# Patient Record
Sex: Female | Born: 1955
Health system: Southern US, Community
[De-identification: ages and names within clinical notes are randomized; demographics above are authoritative.]

## PROBLEM LIST (undated history)

## (undated) DIAGNOSIS — G4700 Insomnia, unspecified: Secondary | ICD-10-CM

## (undated) DIAGNOSIS — Z800 Family history of malignant neoplasm of digestive organs: Secondary | ICD-10-CM

## (undated) DIAGNOSIS — F329 Major depressive disorder, single episode, unspecified: Secondary | ICD-10-CM

## (undated) DIAGNOSIS — J45909 Unspecified asthma, uncomplicated: Secondary | ICD-10-CM

## (undated) DIAGNOSIS — M1990 Unspecified osteoarthritis, unspecified site: Secondary | ICD-10-CM

## (undated) DIAGNOSIS — F419 Anxiety disorder, unspecified: Secondary | ICD-10-CM

## (undated) DIAGNOSIS — C801 Malignant (primary) neoplasm, unspecified: Secondary | ICD-10-CM

## (undated) DIAGNOSIS — R569 Unspecified convulsions: Secondary | ICD-10-CM

## (undated) DIAGNOSIS — M797 Fibromyalgia: Secondary | ICD-10-CM

## (undated) DIAGNOSIS — K219 Gastro-esophageal reflux disease without esophagitis: Secondary | ICD-10-CM

## (undated) DIAGNOSIS — F32A Depression, unspecified: Secondary | ICD-10-CM

## (undated) DIAGNOSIS — Z803 Family history of malignant neoplasm of breast: Secondary | ICD-10-CM

## (undated) DIAGNOSIS — G40909 Epilepsy, unspecified, not intractable, without status epilepticus: Secondary | ICD-10-CM

## (undated) HISTORY — DX: Family history of malignant neoplasm of digestive organs: Z80.0

## (undated) HISTORY — PX: BUNIONECTOMY: SHX129

## (undated) HISTORY — DX: Unspecified convulsions: R56.9

## (undated) HISTORY — DX: Epilepsy, unspecified, not intractable, without status epilepticus: G40.909

## (undated) HISTORY — PX: ABDOMINAL HYSTERECTOMY: SHX81

## (undated) HISTORY — DX: Depression, unspecified: F32.A

## (undated) HISTORY — DX: Unspecified osteoarthritis, unspecified site: M19.90

## (undated) HISTORY — DX: Anxiety disorder, unspecified: F41.9

## (undated) HISTORY — DX: Family history of malignant neoplasm of breast: Z80.3

## (undated) HISTORY — DX: Fibromyalgia: M79.7

## (undated) HISTORY — PX: CARPAL TUNNEL RELEASE: SHX101

## (undated) HISTORY — PX: OTHER SURGICAL HISTORY: SHX169

## (undated) HISTORY — DX: Major depressive disorder, single episode, unspecified: F32.9

## (undated) HISTORY — PX: BREAST BIOPSY: SHX20

## (undated) HISTORY — PX: TUBAL LIGATION: SHX77

## (undated) HISTORY — DX: Unspecified asthma, uncomplicated: J45.909

---

## 1982-01-15 HISTORY — PX: TUBAL LIGATION: SHX77

## 2013-02-25 DIAGNOSIS — Z8659 Personal history of other mental and behavioral disorders: Secondary | ICD-10-CM | POA: Insufficient documentation

## 2013-02-25 DIAGNOSIS — Z9889 Other specified postprocedural states: Secondary | ICD-10-CM | POA: Insufficient documentation

## 2013-02-25 DIAGNOSIS — Z8679 Personal history of other diseases of the circulatory system: Secondary | ICD-10-CM | POA: Insufficient documentation

## 2013-02-25 DIAGNOSIS — M7730 Calcaneal spur, unspecified foot: Secondary | ICD-10-CM | POA: Insufficient documentation

## 2013-03-26 DIAGNOSIS — R87610 Atypical squamous cells of undetermined significance on cytologic smear of cervix (ASC-US): Secondary | ICD-10-CM | POA: Insufficient documentation

## 2014-03-29 ENCOUNTER — Encounter: Payer: Self-pay | Admitting: Family Medicine

## 2014-03-29 ENCOUNTER — Ambulatory Visit (INDEPENDENT_AMBULATORY_CARE_PROVIDER_SITE_OTHER): Payer: No Typology Code available for payment source

## 2014-03-29 ENCOUNTER — Ambulatory Visit (INDEPENDENT_AMBULATORY_CARE_PROVIDER_SITE_OTHER): Payer: No Typology Code available for payment source | Admitting: Family Medicine

## 2014-03-29 VITALS — BP 134/86 | HR 71 | Temp 98.3°F | Resp 16 | Ht 60.25 in | Wt 165.4 lb

## 2014-03-29 DIAGNOSIS — Z8709 Personal history of other diseases of the respiratory system: Secondary | ICD-10-CM | POA: Diagnosis not present

## 2014-03-29 DIAGNOSIS — T7421XA Adult sexual abuse, confirmed, initial encounter: Secondary | ICD-10-CM

## 2014-03-29 DIAGNOSIS — M25562 Pain in left knee: Secondary | ICD-10-CM | POA: Diagnosis not present

## 2014-03-29 DIAGNOSIS — R0602 Shortness of breath: Secondary | ICD-10-CM

## 2014-03-29 MED ORDER — MELOXICAM 15 MG PO TABS: 15.0000 mg | ORAL_TABLET | Freq: Every day | ORAL | Status: DC
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
## 2014-03-29 NOTE — Patient Instructions (Signed)
Good to see you today!  Your spirometry (breathing test) looks good.  If you have any worsening or change in your breathing symptoms please let me know Your knee I hope will get back to baseline- however if it does not we can pursue an MRI.  Try taking meloxicam once a day as needed.  While you are taking this do not take other OTC NSAIDs such as ibuprofen.  You can continue to use your knee brace as needed   There are many good counselors in [place].  A couple of suggestions are  [facility]  Address: [address], [place], [place] [address]  [phone]  [facility]  Address: [address] [place], [place] [address]  [phone]

## 2014-05-25 ENCOUNTER — Ambulatory Visit: Payer: No Typology Code available for payment source | Admitting: Family Medicine

## 2014-05-27 ENCOUNTER — Ambulatory Visit (INDEPENDENT_AMBULATORY_CARE_PROVIDER_SITE_OTHER): Payer: No Typology Code available for payment source | Admitting: Family Medicine

## 2014-05-27 ENCOUNTER — Encounter: Payer: Self-pay | Admitting: Family Medicine

## 2014-05-27 VITALS — BP 124/84 | HR 81 | Temp 99.0°F | Resp 16 | Ht 60.0 in | Wt 165.8 lb

## 2014-05-27 DIAGNOSIS — M25562 Pain in left knee: Secondary | ICD-10-CM

## 2014-05-27 DIAGNOSIS — F418 Other specified anxiety disorders: Secondary | ICD-10-CM | POA: Diagnosis not present

## 2014-05-27 DIAGNOSIS — G40109 Localization-related (focal) (partial) symptomatic epilepsy and epileptic syndromes with simple partial seizures, not intractable, without status epilepticus: Secondary | ICD-10-CM | POA: Diagnosis not present

## 2014-05-27 DIAGNOSIS — M797 Fibromyalgia: Secondary | ICD-10-CM

## 2014-05-27 MED ORDER — SULINDAC 200 MG PO TABS: 200.0000 mg | ORAL_TABLET | Freq: Two times a day (BID) | ORAL | Status: DC

## 2014-05-31 ENCOUNTER — Encounter: Payer: Self-pay | Admitting: Family Medicine

## 2014-05-31 DIAGNOSIS — F331 Major depressive disorder, recurrent, moderate: Secondary | ICD-10-CM | POA: Insufficient documentation

## 2014-05-31 DIAGNOSIS — M797 Fibromyalgia: Secondary | ICD-10-CM | POA: Insufficient documentation

## 2014-05-31 DIAGNOSIS — F329 Major depressive disorder, single episode, unspecified: Secondary | ICD-10-CM | POA: Insufficient documentation
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
## 2014-06-28 ENCOUNTER — Ambulatory Visit: Payer: No Typology Code available for payment source | Admitting: Family Medicine

## 2014-08-24 ENCOUNTER — Ambulatory Visit (INDEPENDENT_AMBULATORY_CARE_PROVIDER_SITE_OTHER): Payer: No Typology Code available for payment source | Admitting: Family Medicine

## 2014-08-24 ENCOUNTER — Encounter: Payer: Self-pay | Admitting: Family Medicine

## 2014-08-24 ENCOUNTER — Telehealth: Payer: Self-pay | Admitting: Family Medicine

## 2014-08-24 VITALS — BP 130/83 | HR 75 | Temp 97.7°F | Resp 16 | Wt 169.0 lb

## 2014-08-24 DIAGNOSIS — R05 Cough: Secondary | ICD-10-CM

## 2014-08-24 DIAGNOSIS — R0602 Shortness of breath: Secondary | ICD-10-CM

## 2014-08-24 DIAGNOSIS — M797 Fibromyalgia: Secondary | ICD-10-CM

## 2014-08-24 DIAGNOSIS — R5383 Other fatigue: Secondary | ICD-10-CM | POA: Diagnosis not present

## 2014-08-24 DIAGNOSIS — R059 Cough, unspecified: Secondary | ICD-10-CM

## 2014-08-24 DIAGNOSIS — E669 Obesity, unspecified: Secondary | ICD-10-CM

## 2014-08-24 DIAGNOSIS — J4530 Mild persistent asthma, uncomplicated: Secondary | ICD-10-CM

## 2014-08-24 DIAGNOSIS — M25562 Pain in left knee: Secondary | ICD-10-CM | POA: Diagnosis not present

## 2014-08-24 LAB — CBC
HCT: 41.9 % (ref 36.0–46.0)
Hemoglobin: 14.3 g/dL (ref 12.0–15.0)
MCH: 32 pg (ref 26.0–34.0)
MCHC: 34.1 g/dL (ref 30.0–36.0)
MCV: 93.7 fL (ref 78.0–100.0)
MPV: 11.5 fL (ref 8.6–12.4)
Platelets: 257 10*3/uL (ref 150–400)
RBC: 4.47 MIL/uL (ref 3.87–5.11)
RDW: 13.2 % (ref 11.5–15.5)
WBC: 5.7 10*3/uL (ref 4.0–10.5)

## 2014-08-24 LAB — COMPREHENSIVE METABOLIC PANEL
ALBUMIN: 4.1 g/dL (ref 3.6–5.1)
ALK PHOS: 69 U/L (ref 33–130)
ALT: 12 U/L (ref 6–29)
AST: 18 U/L (ref 10–35)
BUN: 15 mg/dL (ref 7–25)
CALCIUM: 9.1 mg/dL (ref 8.6–10.4)
CO2: 26 mmol/L (ref 20–31)
CREATININE: 0.78 mg/dL (ref 0.50–1.05)
Chloride: 104 mmol/L (ref 98–110)
Glucose, Bld: 90 mg/dL (ref 65–99)
Potassium: 4 mmol/L (ref 3.5–5.3)
Sodium: 140 mmol/L (ref 135–146)
TOTAL PROTEIN: 6.8 g/dL (ref 6.1–8.1)
Total Bilirubin: 0.4 mg/dL (ref 0.2–1.2)

## 2014-08-24 LAB — LIPID PANEL
CHOLESTEROL: 193 mg/dL (ref 125–200)
HDL: 58 mg/dL (ref >=46)
LDL Cholesterol: 113 mg/dL (ref <130)
TRIGLYCERIDES: 110 mg/dL (ref <150)
Total CHOL/HDL Ratio: 3.3 Ratio (ref <=5.0)
VLDL: 22 mg/dL (ref <30)

## 2014-08-24 LAB — PULMONARY FUNCTION TEST

## 2014-08-24 LAB — TSH: TSH: 2.232 u[IU]/mL (ref 0.350–4.500)

## 2014-08-24 LAB — HEMOGLOBIN A1C
Hgb A1c MFr Bld: 5.9 % — ABNORMAL HIGH (ref <5.7)
MEAN PLASMA GLUCOSE: 123 mg/dL — AB (ref <117)

## 2014-08-24 MED ORDER — ALBUTEROL SULFATE (2.5 MG/3ML) 0.083% IN NEBU: 2.5000 mg | INHALATION_SOLUTION | Freq: Once | RESPIRATORY_TRACT | Status: DC

## 2014-08-24 MED ORDER — ALBUTEROL SULFATE HFA 108 (90 BASE) MCG/ACT IN AERS: 2.0000 | INHALATION_SPRAY | RESPIRATORY_TRACT | Status: DC | PRN

## 2014-08-24 NOTE — Patient Instructions (Signed)
Take ranitidine twice a day for two weeks- if this significantly helps your acid indigestion, you can keep taking twice a day. Otherwise, you can continue to take once a day.   Gastroesophageal Reflux Disease, Adult Gastroesophageal reflux disease (GERD) happens when acid from your stomach flows up into the esophagus. When acid comes in contact with the esophagus, the acid causes soreness (inflammation) in the esophagus. Over time, GERD may create small holes (ulcers) in the lining of the esophagus. CAUSES   Increased body weight. This puts pressure on the stomach, making acid rise from the stomach into the esophagus.  Smoking. This increases acid production in the stomach.  Drinking alcohol. This causes decreased pressure in the lower esophageal sphincter (valve or ring of muscle between the esophagus and stomach), allowing acid from the stomach into the esophagus.  Late evening meals and a full stomach. This increases pressure and acid production in the stomach.  A malformed lower esophageal sphincter. Sometimes, no cause is found. SYMPTOMS   Burning pain in the lower part of the mid-chest behind the breastbone and in the mid-stomach area. This may occur twice a week or more often.  Trouble swallowing.  Sore throat.  Dry cough.  Asthma-like symptoms including chest tightness, shortness of breath, or wheezing. DIAGNOSIS  Your caregiver may be able to diagnose GERD based on your symptoms. In some cases, X-rays and other tests may be done to check for complications or to check the condition of your stomach and esophagus. TREATMENT  Your caregiver may recommend over-the-counter or prescription medicines to help decrease acid production. Ask your caregiver before starting or adding any new medicines.  HOME CARE INSTRUCTIONS   Change the factors that you can control. Ask your caregiver for guidance concerning weight loss, quitting smoking, and alcohol consumption.  Avoid foods and  drinks that make your symptoms worse, such as:  Caffeine or alcoholic drinks.  Chocolate.  Peppermint or mint flavorings.  Garlic and onions.  Spicy foods.  Citrus fruits, such as oranges, lemons, or limes.  Tomato-based foods such as sauce, chili, salsa, and pizza.  Fried and fatty foods.  Avoid lying down for the 3 hours prior to your bedtime or prior to taking a nap.  Eat small, frequent meals instead of large meals.  Wear loose-fitting clothing. Do not wear anything tight around your waist that causes pressure on your stomach.  Raise the head of your bed 6 to 8 inches with wood blocks to help you sleep. Extra pillows will not help.  Only take over-the-counter or prescription medicines for pain, discomfort, or fever as directed by your caregiver.  Do not take aspirin, ibuprofen, or other nonsteroidal anti-inflammatory drugs (NSAIDs). SEEK IMMEDIATE MEDICAL CARE IF:   You have pain in your arms, neck, jaw, teeth, or back.  Your pain increases or changes in intensity or duration.  You develop nausea, vomiting, or sweating (diaphoresis).  You develop shortness of breath, or you faint.  Your vomit is green, yellow, black, or looks like coffee grounds or blood.  Your stool is red, bloody, or black. These symptoms could be signs of other problems, such as heart disease, gastric bleeding, or esophageal bleeding. MAKE SURE YOU:   Understand these instructions.  Will watch your condition.  Will get help right away if you are not doing well or get worse. Document Released: 10/11/2004 Document Revised: 03/26/2011 Document Reviewed: 07/21/2010 [facility] Patient Information 2015 [place], [place]. This information is not intended to replace advice given to you by  your health care provider. Make sure you discuss any questions you have with your health care provider.  Asthma Asthma is a recurring condition in which the airways tighten and narrow. Asthma can make it difficult to  breathe. It can cause coughing, wheezing, and shortness of breath. Asthma episodes, also called asthma attacks, range from minor to life-threatening. Asthma cannot be cured, but medicines and lifestyle changes can help control it. CAUSES Asthma is believed to be caused by inherited (genetic) and environmental factors, but its exact cause is unknown. Asthma may be triggered by allergens, lung infections, or irritants in the air. Asthma triggers are different for each person. Common triggers include:   Animal dander.  Dust mites.  Cockroaches.  Pollen from trees or grass.  Mold.  Smoke.  Air pollutants such as dust, household cleaners, hair sprays, aerosol sprays, paint fumes, strong chemicals, or strong odors.  Cold air, weather changes, and winds (which increase molds and pollens in the air).  Strong emotional expressions such as crying or laughing hard.  Stress.  Certain medicines (such as aspirin) or types of drugs (such as beta-blockers).  Sulfites in foods and drinks. Foods and drinks that may contain sulfites include dried fruit, potato chips, and sparkling grape juice.  Infections or inflammatory conditions such as the flu, a cold, or an inflammation of the nasal membranes (rhinitis).  Gastroesophageal reflux disease (GERD).  Exercise or strenuous activity. SYMPTOMS Symptoms may occur immediately after asthma is triggered or many hours later. Symptoms include:  Wheezing.  Excessive nighttime or early morning coughing.  Frequent or severe coughing with a common cold.  Chest tightness.  Shortness of breath. DIAGNOSIS  The diagnosis of asthma is made by a review of your medical history and a physical exam. Tests may also be performed. These may include:  Lung function studies. These tests show how much air you breathe in and out.  Allergy tests.  Imaging tests such as X-rays. TREATMENT  Asthma cannot be cured, but it can usually be controlled. Treatment  involves identifying and avoiding your asthma triggers. It also involves medicines. There are 2 classes of medicine used for asthma treatment:   Controller medicines. These prevent asthma symptoms from occurring. They are usually taken every day.  Reliever or rescue medicines. These quickly relieve asthma symptoms. They are used as needed and provide short-term relief. Your health care provider will help you create an asthma action plan. An asthma action plan is a written plan for managing and treating your asthma attacks. It includes a list of your asthma triggers and how they may be avoided. It also includes information on when medicines should be taken and when their dosage should be changed. An action plan may also involve the use of a device called a peak flow meter. A peak flow meter measures how well the lungs are working. It helps you monitor your condition. HOME CARE INSTRUCTIONS   Take medicines only as directed by your health care provider. Speak with your health care provider if you have questions about how or when to take the medicines.  Use a peak flow meter as directed by your health care provider. Record and keep track of readings.  Understand and use the action plan to help minimize or stop an asthma attack without needing to seek medical care.  Control your home environment in the following ways to help prevent asthma attacks:  Do not smoke. Avoid being exposed to secondhand smoke.  Change your heating and air conditioning filter  regularly.  Limit your use of fireplaces and wood stoves.  Get rid of pests (such as roaches and mice) and their droppings.  Throw away plants if you see mold on them.  Clean your floors and dust regularly. Use unscented cleaning products.  Try to have someone else vacuum for you regularly. Stay out of rooms while they are being vacuumed and for a short while afterward. If you vacuum, use a dust mask from a hardware store, a double-layered or  microfilter vacuum cleaner bag, or a vacuum cleaner with a HEPA filter.  Replace carpet with wood, tile, or vinyl flooring. Carpet can trap dander and dust.  Use allergy-proof pillows, mattress covers, and box spring covers.  Wash bed sheets and blankets every week in hot water and dry them in a dryer.  Use blankets that are made of polyester or cotton.  Clean bathrooms and kitchens with bleach. If possible, have someone repaint the walls in these rooms with mold-resistant paint. Keep out of the rooms that are being cleaned and painted.  Wash hands frequently. SEEK MEDICAL CARE IF:   You have wheezing, shortness of breath, or a cough even if taking medicine to prevent attacks.  The colored mucus you cough up (sputum) is thicker than usual.  Your sputum changes from clear or white to yellow, green, gray, or bloody.  You have any problems that may be related to the medicines you are taking (such as a rash, itching, swelling, or trouble breathing).  You are using a reliever medicine more than 2-3 times per week.  Your peak flow is still at 50-79% of your personal best after following your action plan for 1 hour.  You have a fever. SEEK IMMEDIATE MEDICAL CARE IF:   You seem to be getting worse and are unresponsive to treatment during an asthma attack.  You are short of breath even at rest.  You get short of breath when doing very little physical activity.  You have difficulty eating, drinking, or talking due to asthma symptoms.  You develop chest pain.  You develop a fast heartbeat.  You have a bluish color to your lips or fingernails.  You are light-headed, dizzy, or faint.  Your peak flow is less than 50% of your personal best. MAKE SURE YOU:   Understand these instructions.  Will watch your condition.  Will get help right away if you are not doing well or get worse. Document Released: 01/01/2005 Document Revised: 05/18/2013 Document Reviewed: 07/31/2012 [facility]  Patient Information 2015 [place], [place]. This information is not intended to replace advice given to you by your health care provider. Make sure you discuss any questions you have with your health care provider.

## 2014-08-24 NOTE — Progress Notes (Signed)
Subjective:    Patient ID: Rebekah [name], female    DOB: Jun 29, 1955, 59 y.o.   MRN: [id number]  HPI This is a 59 yo female who is brought in by her daughter. The patient presents today with complaint of SOB. She has difficulty describing it. It has been going on for "a long time." She has a nonproductive cough. She can not catch her breath when she starts coughing. She was on albuterol inhaler and Qvar in the past for asthma. Not sure why she stopped them. Had dry mouth with Qvar. Has been able to do normal activities. Sleep not disrupted.   Has history of fibromyalgia, stopped duloxitine several weeks ago. States it wasn't working for her.   Has left knee pain, takes ibuprofen 600 mg with good relief. Rarely takes sulindac- "doesn't help." She did not follow up as advised at last visit. Would like a referral to ortho.   Has reflux symptoms. Relieved with ranitidine 150 mg po qd. Doesn't take at regular time each day. Symptoms worse with caffeine.   Has mammogram at [facility] annually. Has never had screening colonoscopy.   Past Medical History  Diagnosis Date  . Asthma   . Depression    Past Surgical History  Procedure Laterality Date  . Breast surgery Right     biopsy in the 1990's  . Tubal ligation  1984  . Hand surgery Bilateral     2000's  . Bunionectomy Bilateral     2000's - R twice and L once   Family History  Problem Relation Age of Onset  . Cancer Mother     breast   . Diabetes Brother   . Hypertension Brother   . Stroke Brother   . Cancer Maternal Grandmother     breast   History  Substance Use Topics  . Smoking status: Never Smoker   . Smokeless tobacco: Not on file  . Alcohol Use: No     Review of Systems No chest pain, no fever, no wheeze, no edema. No nausea, no vomiting.     Objective:   Physical Exam Physical Exam  Constitutional: Oriented to person, place, and time. Appears well-developed and well-nourished.  HENT:  Head:  Normocephalic and atraumatic.  Eyes: Conjunctivae are normal.  Neck: Normal range of motion. Neck supple.  Cardiovascular: Normal rate, regular rhythm and normal heart sounds.   Pulmonary/Chest: Effort normal and breath sounds normal. Patient with rare nonproductive cough during office visit.  Musculoskeletal: Normal range of motion.  Neurological: Alert and oriented to person, place, and time.  Skin: Skin is warm and dry.  Psychiatric: Normal mood and affect. Behavior is normal. Judgment and thought content normal.  Vitals reviewed. BP 130/83 mmHg  Pulse 75  Temp(Src) 97.7 F (36.5 C) (Oral)  Resp 16  Wt 169 lb (76.658 kg) In office spirometry showed no obstruction or restriction. Slight increase of FEV1 following albuterol nebulizer treatment along with subjective improvement.     Assessment & Plan:  Had long discussion with patient and her daughter regarding need for regular follow up. She did not follow through with recommendations from previous visits. Also encouraged her to discuss medication changes before stopping. 1. Cough - PFT PULM FXN SPIROMETRY (94010) - CBC - Comprehensive metabolic panel - albuterol (PROVENTIL) (2.5 MG/3ML) 0.083% nebulizer solution 2.5 mg; Take 3 mLs (2.5 mg total) by nebulization once.  2. Shortness of breath - PFT PULM FXN SPIROMETRY (94010) - Comprehensive metabolic panel - albuterol (PROVENTIL) (2.5 MG/3ML)  0.083% nebulizer solution 2.5 mg; Take 3 mLs (2.5 mg total) by nebulization once. - difficult to know how much of this is related to her depression/anxiety as her PFTs, physical exam and daily activities are ok.   3. Fibromyalgia - patient stopped her duloxetine, will follow up with her symptoms at her next visit  4. Obesity - Comprehensive metabolic panel - Lipid panel - TSH - Hemoglobin A1c  5. Asthma, chronic, mild persistent, uncomplicated - patient with previous diagnosis of asthma  - discussed need for treatment  6. Other  fatigue - CBC  7. Left medial knee pain - Ambulatory referral to Orthopedic Surgery  - Will check on her when calling her for lab results. She agreed to follow up in 1 month.   Clarene Reamer, FNP-BC  [facility]  08/27/2014 7:03 AM

## 2014-08-24 NOTE — Telephone Encounter (Signed)
Tor Netters will make a referral for patient to go see Ortho for knee pain I advised patient that someone will give her a call back about that referral

## 2014-08-24 NOTE — Telephone Encounter (Signed)
Before Ms. Valdez-Caldera left, she said she needs a follow-up appointment with the Sports Medicine provider here. She'd like a phone call in regards to this appointment. I was unaware as to what she was speaking of. Please call the pt. Pt ph# [phone] Thank you.

## 2014-09-29 ENCOUNTER — Ambulatory Visit (INDEPENDENT_AMBULATORY_CARE_PROVIDER_SITE_OTHER): Payer: No Typology Code available for payment source

## 2014-09-29 ENCOUNTER — Ambulatory Visit: Payer: No Typology Code available for payment source | Admitting: Family Medicine

## 2014-09-29 ENCOUNTER — Ambulatory Visit (INDEPENDENT_AMBULATORY_CARE_PROVIDER_SITE_OTHER): Payer: No Typology Code available for payment source | Admitting: Family Medicine

## 2014-09-29 ENCOUNTER — Encounter: Payer: Self-pay | Admitting: Family Medicine

## 2014-09-29 VITALS — BP 120/80 | HR 97 | Temp 99.4°F | Resp 16 | Ht 59.5 in | Wt 168.4 lb

## 2014-09-29 DIAGNOSIS — R52 Pain, unspecified: Secondary | ICD-10-CM | POA: Diagnosis not present

## 2014-09-29 DIAGNOSIS — R5381 Other malaise: Secondary | ICD-10-CM | POA: Diagnosis not present

## 2014-09-29 DIAGNOSIS — R059 Cough, unspecified: Secondary | ICD-10-CM

## 2014-09-29 DIAGNOSIS — R05 Cough: Secondary | ICD-10-CM

## 2014-09-29 DIAGNOSIS — R509 Fever, unspecified: Secondary | ICD-10-CM

## 2014-09-29 LAB — POCT URINALYSIS DIPSTICK
Glucose, UA: NEGATIVE
Nitrite, UA: NEGATIVE
PROTEIN UA: NEGATIVE
SPEC GRAV UA: 1.02
UROBILINOGEN UA: 0.2
pH, UA: 5.5

## 2014-09-29 LAB — POCT UA - MICROSCOPIC ONLY
CASTS, UR, LPF, POC: NEGATIVE
CRYSTALS, UR, HPF, POC: NEGATIVE
Mucus, UA: NEGATIVE

## 2014-09-29 LAB — POCT CBC
Granulocyte percent: 73.7 %G (ref 37–80)
HCT, POC: 44.5 % (ref 37.7–47.9)
HEMOGLOBIN: 14.1 g/dL (ref 12.2–16.2)
LYMPH, POC: 1.5 (ref 0.6–3.4)
MCH, POC: 29.7 pg (ref 27–31.2)
MCHC: 31.8 g/dL (ref 31.8–35.4)
MCV: 93.7 fL (ref 80–97)
MID (cbc): 0.2 (ref 0–0.9)
MPV: 9.1 fL (ref 0–99.8)
PLATELET COUNT, POC: 235 10*3/uL (ref 142–424)
POC Granulocyte: 4.9 (ref 2–6.9)
POC LYMPH PERCENT: 22.7 %L (ref 10–50)
POC MID %: 3.6 % (ref 0–12)
RBC: 4.75 M/uL (ref 4.04–5.48)
RDW, POC: 13.2 %
WBC: 6.6 10*3/uL (ref 4.6–10.2)

## 2014-09-29 MED ORDER — MELOXICAM 15 MG PO TABS: 15.0000 mg | ORAL_TABLET | Freq: Every day | ORAL | Status: DC

## 2014-09-29 MED ORDER — CIPROFLOXACIN HCL 500 MG PO TABS: 500.0000 mg | ORAL_TABLET | Freq: Two times a day (BID) | ORAL | Status: DC

## 2014-09-29 NOTE — Patient Instructions (Signed)
I will be in touch with your urine culture results asap Let me know if you are getting worse or having any change in your symptoms

## 2014-09-29 NOTE — Progress Notes (Signed)
[facility] [address], [place] [address] [phone]  Date:  09/29/2014   Name:  Rebekah [name]   DOB:  1955-08-17   MRN:  [id number]  PCP:  No primary care provider on file.    Chief Complaint: Cough; shortness of breath; sharp pain; and depression screen   History of Present Illness:  Rebekah [name] is a 59 y.o. very pleasant female patient who presents with the following:  Here today with complaint of pain in her entire upper body from her ribs up, and through both arms and hands. She states that yesterday she had a fever of 101, and has noted low energy. The low energy is more long- stnading She has not had a cough No abd pain  She states that she has pain in her arms and back everyday- this is due to her FBM.  She has had the pain in her arms for an undetermined about of time. However she was feeling pretty good this week until she got the fever yesterday   She feels that her inhalers help her some of the time.  However sometimes "when I take a deep breath out loud" it will seem unusual to her, she cannot really describe this further She was seen about one month ago with complaint of SOB, cough- at that time she was started back on her albuterol.    She stopped taking her cymbalta because when she did NOT take it she would get spasms in her face- this went away when she did take it.  She decided that she did not like this so she stopped taking it.  She does not wish to go back on it.    She takes ibuprofen as needed - this does help some for her FBM pain  She is taking care of her pre-school aged grand-daughter and admits that this is wearing her out  Patient Active Problem List   Diagnosis Date Noted  . Fibromyalgia 05/31/2014  . Anxiety associated with depression 05/31/2014  . Calcaneal spur 02/25/2013  . History of decompression of median nerve 02/25/2013    Past Medical History  Diagnosis Date  . Asthma   . Depression      Past Surgical History  Procedure Laterality Date  . Breast surgery Right     biopsy in the 1990's  . Tubal ligation  1984  . Hand surgery Bilateral     2000's  . Bunionectomy Bilateral     2000's - R twice and L once    Social History  Substance Use Topics  . Smoking status: Never Smoker   . Smokeless tobacco: None  . Alcohol Use: No    Family History  Problem Relation Age of Onset  . Cancer Mother     breast   . Diabetes Brother   . Hypertension Brother   . Stroke Brother   . Cancer Maternal Grandmother     breast    Allergies  Allergen Reactions  . Zolpidem Tartrate     Other reaction(s): Hallucination  . Carisoprodol Other (See Comments)    chills    Medication list has been reviewed and updated.  Current Outpatient Prescriptions on File Prior to Visit  Medication Sig Dispense Refill  . albuterol (PROVENTIL HFA;VENTOLIN HFA) 108 (90 BASE) MCG/ACT inhaler Inhale 2 puffs into the lungs every 4 (four) hours as needed for wheezing or shortness of breath (cough, shortness of breath or wheezing.). 1 Inhaler 2  . Omega-3 Fatty  Acids (FISH OIL) 1000 MG CAPS Take by mouth.    . sulindac (CLINORIL) 200 MG tablet Take 1 tablet (200 mg total) by mouth 2 (two) times daily. 60 tablet 3   Current Facility-Administered Medications on File Prior to Visit  Medication Dose Route Frequency Provider Last Rate Last Dose  . albuterol (PROVENTIL) (2.5 MG/3ML) 0.083% nebulizer solution 2.5 mg  2.5 mg Nebulization Once Elby Beck, FNP        Review of Systems:  As per HPI- otherwise negative.   Physical Examination: Filed Vitals:   09/29/14 1246  BP: 120/80  Pulse: 103  Temp: 99.4 F (37.4 C)  Resp: 16   Filed Vitals:   09/29/14 1246  Height: 4' 11.5" (1.511 m)  Weight: 168 lb 6.4 oz (76.386 kg)   Body mass index is 33.46 kg/(m^2). Ideal Body Weight: Weight in (lb) to have BMI = 25: 125.6  GEN: WDWN, NAD, Non-toxic, A & O x 3, obese, looks well and  healthy  HEENT: Atraumatic, Normocephalic. Neck supple. No masses, No LAD.  Bilateral TM wnl, oropharynx normal.  PEERL,EOMI.   Ears and Nose: No external deformity. CV: RRR, No M/G/R. No JVD. No thrill. No extra heart sounds. PULM: CTA B, no wheezes, crackles, rhonchi. No retractions. No resp. distress. No accessory muscle use. ABD: S, NT, ND, +BS. No rebound. No HSM. Benign belly EXTR: No c/c/e NEURO Normal gait.  PSYCH: Normally interactive. Conversant. Not depressed or anxious appearing.  Calm demeanor.  Bilateral CVA tenderness- however this may be just her baseline back pain  [facility] reading (PRIMARY) by  Dr. Lorelei Pont. CXR: elevation left hemi-diaphragm. OW negative   COMPARISON: None.  FINDINGS: No active infiltrate or effusion is seen. There is some peribronchial thickening present which may indicate bronchitis. Mediastinal and hilar contours are unremarkable. The heart is within normal limits in size. No bony abnormality is seen.  IMPRESSION: No pneumonia. Peribronchial thickening may indicate bronchitis.  Results for orders placed or performed in visit on 09/29/14  POCT UA - Microscopic Only  Result Value Ref Range   WBC, Ur, HPF, POC 0-3    RBC, urine, microscopic 7-9    Bacteria, U Microscopic 2+    Mucus, UA Negative    Epithelial cells, urine per micros 1-7    Crystals, Ur, HPF, POC Negative    Casts, Ur, LPF, POC Negative    Yeast, UA Budding   POCT urinalysis dipstick  Result Value Ref Range   Color, UA Amber    Clarity, UA Cloudy    Glucose, UA Negative    Bilirubin, UA Small    Ketones, UA Trace    Spec Grav, UA 1.020    Blood, UA Moderate    pH, UA 5.5    Protein, UA Negative    Urobilinogen, UA 0.2    Nitrite, UA Negative    Leukocytes, UA Trace (A) Negative  POCT CBC  Result Value Ref Range   WBC 6.6 4.6 - 10.2 K/uL   Lymph, poc 1.5 0.6 - 3.4   POC LYMPH PERCENT 22.7 10 - 50 %L   MID (cbc) 0.2 0 - 0.9   POC MID % 3.6 0 - 12 %M   POC  Granulocyte 4.9 2 - 6.9   Granulocyte percent 73.7 37 - 80 %G   RBC 4.75 4.04 - 5.48 M/uL   Hemoglobin 14.1 12.2 - 16.2 g/dL   HCT, POC 44.5 37.7 - 47.9 %   MCV 93.7 80 - 97  fL   MCH, POC 29.7 27 - 31.2 pg   MCHC 31.8 31.8 - 35.4 g/dL   RDW, POC 13.2 %   Platelet Count, POC 235.0 142 - 424 K/uL   MPV 9.1 0 - 99.8 fL    Assessment and Plan: Other specified fever - Plan: POCT CBC, ciprofloxacin (CIPRO) 500 MG tablet  Cough - Plan: DG Chest 2 View  Body aches - Plan: POCT UA - Microscopic Only, POCT urinalysis dipstick, Urine culture, POCT CBC, meloxicam (MOBIC) 15 MG tablet  Malaise  Here today with malaise, aches and low grade fevers.  She may have pyelonephritis. Will treat with cipro while we await her urine culture  Trial of mobic for her FBM pain Asked her to let us know if not feeling better soon  Signed Lamar Blinks, MD

## 2014-10-02 LAB — URINE CULTURE: Colony Count: 10000

## 2014-10-08 ENCOUNTER — Encounter: Payer: Self-pay | Admitting: Family Medicine

## 2014-10-12 ENCOUNTER — Telehealth: Payer: Self-pay

## 2014-10-12 NOTE — Telephone Encounter (Signed)
Left message for pt to call back.   I need to know why she wants to go to a rhematoid doctor and if she was seen here first for the problem.

## 2014-10-12 NOTE — Telephone Encounter (Signed)
Pt is needing a referral to a rhematologist

## 2014-11-10 ENCOUNTER — Ambulatory Visit (INDEPENDENT_AMBULATORY_CARE_PROVIDER_SITE_OTHER): Payer: No Typology Code available for payment source | Admitting: Family Medicine

## 2014-11-10 ENCOUNTER — Other Ambulatory Visit: Payer: Self-pay

## 2014-11-10 ENCOUNTER — Encounter: Payer: Self-pay | Admitting: Family Medicine

## 2014-11-10 VITALS — BP 124/83 | HR 78 | Temp 98.4°F | Resp 16 | Wt 168.0 lb

## 2014-11-10 DIAGNOSIS — Z1231 Encounter for screening mammogram for malignant neoplasm of breast: Secondary | ICD-10-CM

## 2014-11-10 DIAGNOSIS — Z1239 Encounter for other screening for malignant neoplasm of breast: Secondary | ICD-10-CM

## 2014-11-10 DIAGNOSIS — Z62819 Personal history of unspecified abuse in childhood: Secondary | ICD-10-CM | POA: Insufficient documentation

## 2014-11-10 DIAGNOSIS — R52 Pain, unspecified: Secondary | ICD-10-CM | POA: Diagnosis not present

## 2014-11-10 DIAGNOSIS — R3129 Other microscopic hematuria: Secondary | ICD-10-CM

## 2014-11-10 DIAGNOSIS — Z23 Encounter for immunization: Secondary | ICD-10-CM

## 2014-11-10 DIAGNOSIS — F32A Depression, unspecified: Secondary | ICD-10-CM

## 2014-11-10 DIAGNOSIS — F329 Major depressive disorder, single episode, unspecified: Secondary | ICD-10-CM | POA: Diagnosis not present

## 2014-11-10 LAB — POCT URINALYSIS DIP (MANUAL ENTRY)
Glucose, UA: NEGATIVE
Ketones, POC UA: NEGATIVE
NITRITE UA: NEGATIVE
PROTEIN UA: NEGATIVE
SPEC GRAV UA: 1.025
UROBILINOGEN UA: 0.2
pH, UA: 6

## 2014-11-10 LAB — POC MICROSCOPIC URINALYSIS (UMFC): MUCUS RE: ABSENT

## 2014-11-10 MED ORDER — BUPROPION HCL ER (SR) 150 MG PO TB12: 150.0000 mg | ORAL_TABLET | Freq: Two times a day (BID) | ORAL | Status: DC
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
## 2014-12-17 ENCOUNTER — Ambulatory Visit
Admission: RE | Admit: 2014-12-17 | Discharge: 2014-12-17 | Disposition: A | Discharge status: Home / Self Care | Payer: No Typology Code available for payment source | Source: Ambulatory Visit | Attending: Family Medicine | Admitting: Family Medicine

## 2014-12-17 DIAGNOSIS — Z1231 Encounter for screening mammogram for malignant neoplasm of breast: Secondary | ICD-10-CM

## 2014-12-31 ENCOUNTER — Encounter: Payer: Self-pay | Admitting: Family Medicine

## 2014-12-31 DIAGNOSIS — R3129 Other microscopic hematuria: Secondary | ICD-10-CM | POA: Insufficient documentation

## 2015-01-12 ENCOUNTER — Other Ambulatory Visit: Payer: Self-pay | Admitting: Family Medicine

## 2015-01-12 DIAGNOSIS — R928 Other abnormal and inconclusive findings on diagnostic imaging of breast: Secondary | ICD-10-CM

## 2015-01-13 ENCOUNTER — Ambulatory Visit
Admission: RE | Admit: 2015-01-13 | Discharge: 2015-01-13 | Disposition: A | Discharge status: Home / Self Care | Payer: No Typology Code available for payment source | Source: Ambulatory Visit | Attending: Family Medicine | Admitting: Family Medicine

## 2015-01-13 DIAGNOSIS — R928 Other abnormal and inconclusive findings on diagnostic imaging of breast: Secondary | ICD-10-CM

## 2015-01-26 ENCOUNTER — Ambulatory Visit: Payer: No Typology Code available for payment source | Admitting: Family Medicine

## 2015-03-09 ENCOUNTER — Ambulatory Visit (INDEPENDENT_AMBULATORY_CARE_PROVIDER_SITE_OTHER): Payer: BLUE CROSS/BLUE SHIELD | Admitting: Family Medicine

## 2015-03-09 VITALS — BP 125/82 | HR 72 | Temp 98.1°F | Resp 16 | Ht 59.0 in | Wt 159.0 lb

## 2015-03-09 DIAGNOSIS — F418 Other specified anxiety disorders: Secondary | ICD-10-CM | POA: Diagnosis not present

## 2015-03-09 DIAGNOSIS — M791 Myalgia, unspecified site: Secondary | ICD-10-CM

## 2015-03-09 DIAGNOSIS — Z124 Encounter for screening for malignant neoplasm of cervix: Secondary | ICD-10-CM | POA: Diagnosis not present

## 2015-03-09 DIAGNOSIS — R531 Weakness: Secondary | ICD-10-CM

## 2015-03-09 NOTE — Patient Instructions (Signed)
[facility] Yvone Neu [phone] Vivia Budge- [phone] Jeremy Johann- [phone] Burnard Leigh- [phone] Marya Amsler- [phone] Jodi Geralds [phone] Herby Abraham [place]) [phone]
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
## 2015-03-10 LAB — PAP IG, CT-NG, RFX HPV ASCU
CHLAMYDIA PROBE AMP: NOT DETECTED
GC PROBE AMP: NOT DETECTED

## 2015-03-10 LAB — VITAMIN D 25 HYDROXY (VIT D DEFICIENCY, FRACTURES): Vit D, 25-Hydroxy: 22 ng/mL — ABNORMAL LOW (ref 30–100)

## 2015-03-12 ENCOUNTER — Encounter: Payer: Self-pay | Admitting: Family Medicine

## 2015-04-20 ENCOUNTER — Ambulatory Visit (INDEPENDENT_AMBULATORY_CARE_PROVIDER_SITE_OTHER): Payer: BLUE CROSS/BLUE SHIELD | Admitting: Family Medicine

## 2015-04-20 VITALS — BP 122/76 | HR 80 | Temp 98.2°F | Resp 17 | Ht 59.5 in | Wt 158.0 lb

## 2015-04-20 DIAGNOSIS — M797 Fibromyalgia: Secondary | ICD-10-CM | POA: Diagnosis not present

## 2015-04-20 DIAGNOSIS — F329 Major depressive disorder, single episode, unspecified: Secondary | ICD-10-CM

## 2015-04-20 DIAGNOSIS — F32A Depression, unspecified: Secondary | ICD-10-CM

## 2015-04-20 MED ORDER — DULOXETINE HCL 30 MG PO CPEP: ORAL_CAPSULE | ORAL | Status: DC

## 2015-04-20 NOTE — Patient Instructions (Addendum)
Please walk and stretch every day Start duloxetine one tablet daily for 7 days then take 2 tablets daily- it will take 3-4 weeks to start to work Please make an appointment to follow up at the appointment center for the middle or end of May   Myofascial Pain Syndrome and Fibromyalgia Myofascial pain syndrome and fibromyalgia are both pain disorders. This pain may be felt mainly in your muscles.   Myofascial pain syndrome:  Always has trigger points or tender points in the muscle that will cause pain when pressed. The pain may come and go.  Usually affects your neck, upper back, and shoulder areas. The pain often radiates into your arms and hands.  Fibromyalgia:  Has muscle pains and tenderness that come and go.  Is often associated with fatigue and sleep disturbances.  Has trigger points.  Tends to be long-lasting (chronic), but is not life-threatening. Fibromyalgia and myofascial pain are not the same. However, they often occur together. If you have both conditions, each can make the other worse. Both are common and can cause enough pain and fatigue to make day-to-day activities difficult.  CAUSES  The exact causes of fibromyalgia and myofascial pain are not known. People with certain gene types may be more likely to develop fibromyalgia. Some factors can be triggers for both conditions, such as:   Spine disorders.  Arthritis.  Severe injury (trauma) and other physical stressors.  Being under a lot of stress.  A medical illness. SIGNS AND SYMPTOMS  Fibromyalgia The main symptom of fibromyalgia is widespread pain and tenderness in your muscles. This can vary over time. Pain is sometimes described as stabbing, shooting, or burning. You may have tingling or numbness, too. You may also have sleep problems and fatigue. You may wake up feeling tired and groggy (fibro fog). Other symptoms may include:   Bowel and bladder problems.  Headaches.  Visual problems.  Problems with  odors and noises.  Depression or mood changes.  Painful menstrual periods (dysmenorrhea).  Dry skin or eyes. Myofascial pain syndrome Symptoms of myofascial pain syndrome include:   Tight, ropy bands of muscle.   Uncomfortable sensations in muscular areas, such as:  Aching.  Cramping.  Burning.  Numbness.  Tingling.   Muscle weakness.  Trouble moving certain muscles freely (range of motion). DIAGNOSIS  There are no specific tests to diagnose fibromyalgia or myofascial pain syndrome. Both can be hard to diagnose because their symptoms are common in many other conditions. Your health care provider may suspect one or both of these conditions based on your symptoms and medical history. Your health care provider will also do a physical exam.  The key to diagnosing fibromyalgia is having pain, fatigue, and other symptoms for more than three months that cannot be explained by another condition.  The key to diagnosing myofascial pain syndrome is finding trigger points in muscles that are tender and cause pain elsewhere in your body (referred pain). TREATMENT  Treating fibromyalgia and myofascial pain often requires a team of health care providers. This usually starts with your primary provider and a physical therapist. You may also find it helpful to work with alternative health care providers, such as massage therapists or acupuncturists. Treatment for fibromyalgia may include medicines. This may include nonsteroidal anti-inflammatory drugs (NSAIDs), along with other medicines.  Treatment for myofascial pain may also include:  NSAIDs.  Cooling and stretching of muscles.  Trigger point injections.  Sound wave (ultrasound) treatments to stimulate muscles. HOME CARE INSTRUCTIONS   Take  medicines only as directed by your health care provider.  Exercise as directed by your health care provider or physical therapist.  Try to avoid stressful situations.  Practice relaxation  techniques to control your stress. You may want to try:  Biofeedback.  Visual imagery.  Hypnosis.  Muscle relaxation.  Yoga.  Meditation.  Talk to your health care provider about alternative treatments, such as acupuncture or massage treatment.  Maintain a healthy lifestyle. This includes eating a healthy diet and getting enough sleep.  Consider joining a support group.  Do not do activities that stress or strain your muscles. That includes repetitive motions and heavy lifting. SEEK MEDICAL CARE IF:   You have new symptoms.  Your symptoms get worse.  You have side effects from your medicines.  You have trouble sleeping.  Your condition is causing depression or anxiety. FOR MORE INFORMATION   National Fibromyalgia Association: http://www.fmaware.orgwww.fmaware.[place]: http://www.arthritis.orgwww.arthritis.org  American Chronic Pain Association: StreetWrestling.at.https://stevens.biz/   This information is not intended to replace advice given to you by your health care provider. Make sure you discuss any questions you have with your health care provider.   Document Released: 01/01/2005 Document Revised: 01/22/2014 Document Reviewed: 10/07/2013 Elsevier Interactive Patient Education 2016 [employer].     IF you received an x-ray today, you will receive an invoice from [facility]. Please contact [facility] at [phone] with questions or concerns regarding your invoice.   IF you received labwork today, you will receive an invoice from [employer]. Please contact [facility] at [phone] with questions or concerns regarding your invoice.   Our billing staff will not be able to assist you with questions regarding bills from these companies.  You will be contacted with the lab results as soon as they are available. The fastest way to get your results is  to activate your My Chart account. Instructions are located on the last page of this paperwork. If you have not heard from [place] regarding the results in 2 weeks, please contact this office.
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
## 2015-07-12 ENCOUNTER — Ambulatory Visit (INDEPENDENT_AMBULATORY_CARE_PROVIDER_SITE_OTHER): Payer: BLUE CROSS/BLUE SHIELD | Admitting: Family Medicine

## 2015-07-12 ENCOUNTER — Encounter: Payer: Self-pay | Admitting: Family Medicine

## 2015-07-12 VITALS — BP 105/80 | HR 100 | Temp 98.4°F | Resp 16 | Ht 60.0 in | Wt 159.0 lb

## 2015-07-12 DIAGNOSIS — M797 Fibromyalgia: Secondary | ICD-10-CM

## 2015-07-12 DIAGNOSIS — E559 Vitamin D deficiency, unspecified: Secondary | ICD-10-CM

## 2015-07-12 DIAGNOSIS — F418 Other specified anxiety disorders: Secondary | ICD-10-CM

## 2015-07-12 MED ORDER — CYCLOBENZAPRINE HCL 10 MG PO TABS: 10.0000 mg | ORAL_TABLET | Freq: Every evening | ORAL | Status: DC | PRN

## 2015-07-12 NOTE — Progress Notes (Signed)
Subjective:    Patient ID: Rebekah [name], female    DOB: [birth year], 60 y.o.   MRN: [id number]  HPI This is a pleasant 60 yo female who presents today for follow up of depression and anxiety. She was seen 2 months ago and started duloxetine. She feels like it is helping her mood. Her husband thinks her mood has been better and she is "less mean." She continues to have pain related to her fibromyalgia and poor sleep. She has not been taking her vitamin D supplementation. Last vitamin D level 22. She does not like to take medication. 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 Is very fatigued after working. No regular exercise.  Past Medical History  Diagnosis Date  . Asthma   . Depression    Past Surgical History  Procedure Laterality Date  . Breast surgery Right     biopsy in the 1990's  . Tubal ligation  1984  . Hand surgery Bilateral     2000's  . Bunionectomy Bilateral     2000's - R twice and L once   Family History  Problem Relation Age of Onset  . Cancer Mother     breast   . Diabetes Brother   . Hypertension Brother   . Stroke Brother   . Cancer Maternal Grandmother     breast   Social History  Substance Use Topics  . Smoking status: Never Smoker   . Smokeless tobacco: None  . Alcohol Use: No      Review of Systems No chest pain, no SOB, no swelling, + pain shoulders and hips    Objective:   Physical Exam Physical Exam  Constitutional: Oriented to person, place, and time. She appears well-developed and well-nourished.  HENT:  Head: Normocephalic and atraumatic.  Eyes: Conjunctivae are normal.  Neck: Normal range of motion. Neck supple.  Cardiovascular: Normal rate, regular rhythm and normal heart sounds. No edema.    Pulmonary/Chest: Effort normal and breath sounds normal.  Musculoskeletal: Normal range of motion.  Neurological: Alert and oriented to person, place, and time.  Skin: Skin is warm and dry.  Psychiatric:  Normal mood and affect. Behavior is normal. Judgment and thought content normal.  Vitals reviewed.     BP 105/80 mmHg  Pulse 100  Temp(Src) 98.4 F (36.9 C) (Oral)  Resp 16  Ht 5' (1.524 m)  Wt 159 lb (72.122 kg)  BMI 31.05 kg/m2 Wt Readings from Last 3 Encounters:  07/12/15 159 lb (72.122 kg)  04/20/15 158 lb (71.668 kg)  03/09/15 159 lb (72.122 kg)   Depression screen [facility] 2/9 07/12/2015 04/20/2015 03/09/2015 11/10/2014 09/29/2014  Decreased Interest 0 2 0 2 0  Down, Depressed, Hopeless 0 2 1 2 2   PHQ - 2 Score 0 4 1 4 2   Altered sleeping - 0 - 3 1  Tired, decreased energy - 0 - 3 3  Change in appetite - 0 - 0 0  Feeling bad or failure about yourself  - 2 - 3 3  Trouble concentrating - 0 - 0 0  Moving slowly or fidgety/restless - 2 - 0 0  Suicidal thoughts - 0 - 0 0  PHQ-9 Score - 8 - 13 9  Difficult doing work/chores - Somewhat difficult - - Not difficult at all         Assessment & Plan:  1. Fibromyalgia - encouraged her to walk every day - cyclobenzaprine (FLEXERIL) 10 MG tablet; Take 1  tablet (10 mg total) by mouth at bedtime as needed for muscle spasms.  Dispense: 30 tablet; Refill: 1  2. Anxiety associated with depression - this has improved on duloxetine, continue current dose  3. Vitamin D insufficiency - encouraged her to restart OTC Vitamin D3  - follow up in 3 months  Clarene Reamer, FNP-BC  [facility]  07/12/2015 1:42 PM

## 2015-07-12 NOTE — Patient Instructions (Addendum)
Look at the social security website to look at the disability process  Please take over the counter vitamin D3  Stop taking bendaryl/diphenhydramine for sleep- may cause memory problems as you get older  IF you received an x-ray today, you will receive an invoice from [facility]. Please contact [facility] at [phone] with questions or concerns regarding your invoice.   IF you received labwork today, you will receive an invoice from [employer]. Please contact [facility] at [phone] with questions or concerns regarding your invoice.   Our billing staff will not be able to assist you with questions regarding bills from these companies.  You will be contacted with the lab results as soon as they are available. The fastest way to get your results is to activate your My Chart account. Instructions are located on the last page of this paperwork. If you have not heard from [place] regarding the results in 2 weeks, please contact this office.

## 2015-09-21 ENCOUNTER — Ambulatory Visit (INDEPENDENT_AMBULATORY_CARE_PROVIDER_SITE_OTHER): Payer: BLUE CROSS/BLUE SHIELD

## 2015-09-21 ENCOUNTER — Ambulatory Visit (INDEPENDENT_AMBULATORY_CARE_PROVIDER_SITE_OTHER): Payer: BLUE CROSS/BLUE SHIELD | Admitting: Family Medicine

## 2015-09-21 ENCOUNTER — Encounter: Payer: Self-pay | Admitting: Family Medicine

## 2015-09-21 VITALS — BP 130/90 | HR 90 | Temp 97.9°F | Resp 16 | Ht 59.75 in | Wt 167.0 lb

## 2015-09-21 DIAGNOSIS — F330 Major depressive disorder, recurrent, mild: Secondary | ICD-10-CM

## 2015-09-21 DIAGNOSIS — M542 Cervicalgia: Secondary | ICD-10-CM

## 2015-09-21 DIAGNOSIS — M797 Fibromyalgia: Secondary | ICD-10-CM | POA: Diagnosis not present

## 2015-09-21 DIAGNOSIS — M79641 Pain in right hand: Secondary | ICD-10-CM

## 2015-09-21 DIAGNOSIS — M25511 Pain in right shoulder: Secondary | ICD-10-CM

## 2015-09-21 DIAGNOSIS — R404 Transient alteration of awareness: Secondary | ICD-10-CM | POA: Diagnosis not present

## 2015-09-21 LAB — COMPREHENSIVE METABOLIC PANEL
ALBUMIN: 4.2 g/dL (ref 3.6–5.1)
ALT: 22 U/L (ref 6–29)
AST: 21 U/L (ref 10–35)
Alkaline Phosphatase: 63 U/L (ref 33–130)
BUN: 14 mg/dL (ref 7–25)
CHLORIDE: 104 mmol/L (ref 98–110)
CO2: 28 mmol/L (ref 20–31)
CREATININE: 0.8 mg/dL (ref 0.50–1.05)
Calcium: 9.2 mg/dL (ref 8.6–10.4)
Glucose, Bld: 89 mg/dL (ref 65–99)
POTASSIUM: 4.2 mmol/L (ref 3.5–5.3)
SODIUM: 141 mmol/L (ref 135–146)
Total Bilirubin: 0.5 mg/dL (ref 0.2–1.2)
Total Protein: 7 g/dL (ref 6.1–8.1)

## 2015-09-21 LAB — POCT URINALYSIS DIP (MANUAL ENTRY)
BILIRUBIN UA: NEGATIVE
Bilirubin, UA: NEGATIVE
Glucose, UA: NEGATIVE
Nitrite, UA: NEGATIVE
PH UA: 6
PROTEIN UA: NEGATIVE
SPEC GRAV UA: 1.02
Urobilinogen, UA: 0.2

## 2015-09-21 LAB — CBC WITH DIFFERENTIAL/PLATELET
BASOS ABS: 0 {cells}/uL (ref 0–200)
Basophils Relative: 0 %
EOS ABS: 0 {cells}/uL — AB (ref 15–500)
EOS PCT: 0 %
HCT: 42 % (ref 35.0–45.0)
HEMOGLOBIN: 14.4 g/dL (ref 11.7–15.5)
Lymphocytes Relative: 41 %
Lymphs Abs: 1927 cells/uL (ref 850–3900)
MCH: 32.3 pg (ref 27.0–33.0)
MCHC: 34.3 g/dL (ref 32.0–36.0)
MCV: 94.2 fL (ref 80.0–100.0)
MONOS PCT: 11 %
MPV: 10.3 fL (ref 7.5–12.5)
Monocytes Absolute: 517 cells/uL (ref 200–950)
NEUTROS PCT: 48 %
Neutro Abs: 2256 cells/uL (ref 1500–7800)
PLATELETS: 272 10*3/uL (ref 140–400)
RBC: 4.46 MIL/uL (ref 3.80–5.10)
RDW: 12.9 % (ref 11.0–15.0)
WBC: 4.7 10*3/uL (ref 3.8–10.8)

## 2015-09-21 LAB — VITAMIN B12: VITAMIN B 12: 344 pg/mL (ref 200–1100)

## 2015-09-21 LAB — TSH: TSH: 1.12 m[IU]/L

## 2015-09-21 MED ORDER — DULOXETINE HCL 30 MG PO CPEP: ORAL_CAPSULE | ORAL | 3 refills | Status: DC

## 2015-09-21 MED ORDER — METHOCARBAMOL 500 MG PO TABS: 500.0000 mg | ORAL_TABLET | Freq: Three times a day (TID) | ORAL | 0 refills | Status: DC | PRN

## 2015-09-21 MED ORDER — MELOXICAM 15 MG PO TABS: 15.0000 mg | ORAL_TABLET | Freq: Every day | ORAL | 0 refills | Status: DC
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
## 2015-09-21 NOTE — Patient Instructions (Addendum)
IF you received an x-ray today, you will receive an invoice from [facility]. Please contact [facility] at [phone] with questions or concerns regarding your invoice.   IF you received labwork today, you will receive an invoice from [employer]. Please contact [facility] at [phone] with questions or concerns regarding your invoice.   Our billing staff will not be able to assist you with questions regarding bills from these companies.  You will be contacted with the lab results as soon as they are available. The fastest way to get your results is to activate your My Chart account. Instructions are located on the last page of this paperwork. If you have not heard from [place] regarding the results in 2 weeks, please contact this office.     Cervical Strain and Sprain With Rehab Cervical strain and sprain are injuries that commonly occur with "whiplash" injuries. Whiplash occurs when the neck is forcefully whipped backward or forward, such as during a motor vehicle accident or during contact sports. The muscles, ligaments, tendons, discs, and nerves of the neck are susceptible to injury when this occurs. RISK FACTORS Risk of having a whiplash injury increases if:  Osteoarthritis of the spine.  Situations that make head or neck accidents or trauma more likely.  High-risk sports (football, rugby, wrestling, hockey, auto racing, gymnastics, diving, contact karate, or boxing).  Poor strength and flexibility of the neck.  Previous neck injury.  Poor tackling technique.  Improperly fitted or padded equipment. SYMPTOMS   Pain or stiffness in the front or back of neck or both.  Symptoms may present immediately or up to 24 hours after injury.  Dizziness, headache, nausea, and vomiting.  Muscle spasm with soreness and stiffness in the neck.  Tenderness and swelling at the injury site. PREVENTION  Learn and use proper technique (avoid  tackling with the head, spearing, and head-butting; use proper falling techniques to avoid landing on the head).  Warm up and stretch properly before activity.  Maintain physical fitness:  Strength, flexibility, and endurance.  Cardiovascular fitness.  Wear properly fitted and padded protective equipment, such as padded soft collars, for participation in contact sports. PROGNOSIS  Recovery from cervical strain and sprain injuries is dependent on the extent of the injury. These injuries are usually curable in 1 week to 3 months with appropriate treatment.  RELATED COMPLICATIONS   Temporary numbness and weakness may occur if the nerve roots are damaged, and this may persist until the nerve has completely healed.  Chronic pain due to frequent recurrence of symptoms.  Prolonged healing, especially if activity is resumed too soon (before complete recovery). TREATMENT  Treatment initially involves the use of ice and medication to help reduce pain and inflammation. It is also important to perform strengthening and stretching exercises and modify activities that worsen symptoms so the injury does not get worse. These exercises may be performed at home or with a therapist. For patients who experience severe symptoms, a soft, padded collar may be recommended to be worn around the neck.  Improving your posture may help reduce symptoms. Posture improvement includes pulling your chin and abdomen in while sitting or standing. If you are sitting, sit in a firm chair with your buttocks against the back of the chair. While sleeping, try replacing your pillow with a small towel rolled to 2 inches in diameter, or use a cervical pillow or soft cervical collar. Poor sleeping positions delay healing.  For patients with nerve root damage,  which causes numbness or weakness, the use of a cervical traction apparatus may be recommended. Surgery is rarely necessary for these injuries. However, cervical strain and sprains  that are present at birth (congenital) may require surgery. MEDICATION   If pain medication is necessary, nonsteroidal anti-inflammatory medications, such as aspirin and ibuprofen, or other minor pain relievers, such as acetaminophen, are often recommended.  Do not take pain medication for 7 days before surgery.  Prescription pain relievers may be given if deemed necessary by your caregiver. Use only as directed and only as much as you need. HEAT AND COLD:   Cold treatment (icing) relieves pain and reduces inflammation. Cold treatment should be applied for 10 to 15 minutes every 2 to 3 hours for inflammation and pain and immediately after any activity that aggravates your symptoms. Use ice packs or an ice massage.  Heat treatment may be used prior to performing the stretching and strengthening activities prescribed by your caregiver, physical therapist, or athletic trainer. Use a heat pack or a warm soak. SEEK MEDICAL CARE IF:   Symptoms get worse or do not improve in 2 weeks despite treatment.  New, unexplained symptoms develop (drugs used in treatment may produce side effects). EXERCISES RANGE OF MOTION (ROM) AND STRETCHING EXERCISES - Cervical Strain and Sprain These exercises may help you when beginning to rehabilitate your injury. In order to successfully resolve your symptoms, you must improve your posture. These exercises are designed to help reduce the forward-head and rounded-shoulder posture which contributes to this condition. Your symptoms may resolve with or without further involvement from your physician, physical therapist or athletic trainer. While completing these exercises, remember:   Restoring tissue flexibility helps normal motion to return to the joints. This allows healthier, less painful movement and activity.  An effective stretch should be held for at least 20 seconds, although you may need to begin with shorter hold times for comfort.  A stretch should never be  painful. You should only feel a gentle lengthening or release in the stretched tissue. STRETCH- Axial Extensors  Lie on your back on the floor. You may bend your knees for comfort. Place a rolled-up hand towel or dish towel, about 2 inches in diameter, under the part of your head that makes contact with the floor.  Gently tuck your chin, as if trying to make a "double chin," until you feel a gentle stretch at the base of your head.  Hold __________ seconds. Repeat __________ times. Complete this exercise __________ times per day.  STRETCH - Axial Extension   Stand or sit on a firm surface. Assume a good posture: chest up, shoulders drawn back, abdominal muscles slightly tense, knees unlocked (if standing) and feet hip width apart.  Slowly retract your chin so your head slides back and your chin slightly lowers. Continue to look straight ahead.  You should feel a gentle stretch in the back of your head. Be certain not to feel an aggressive stretch since this can cause headaches later.  Hold for __________ seconds. Repeat __________ times. Complete this exercise __________ times per day. STRETCH - Cervical Side Bend   Stand or sit on a firm surface. Assume a good posture: chest up, shoulders drawn back, abdominal muscles slightly tense, knees unlocked (if standing) and feet hip width apart.  Without letting your nose or shoulders move, slowly tip your right / left ear to your shoulder until your feel a gentle stretch in the muscles on the opposite side of your neck.  Hold __________ seconds. Repeat __________ times. Complete this exercise __________ times per day. STRETCH - Cervical Rotators   Stand or sit on a firm surface. Assume a good posture: chest up, shoulders drawn back, abdominal muscles slightly tense, knees unlocked (if standing) and feet hip width apart.  Keeping your eyes level with the ground, slowly turn your head until you feel a gentle stretch along the back and opposite  side of your neck.  Hold __________ seconds. Repeat __________ times. Complete this exercise __________ times per day. RANGE OF MOTION - Neck Circles   Stand or sit on a firm surface. Assume a good posture: chest up, shoulders drawn back, abdominal muscles slightly tense, knees unlocked (if standing) and feet hip width apart.  Gently roll your head down and around from the back of one shoulder to the back of the other. The motion should never be forced or painful.  Repeat the motion 10-20 times, or until you feel the neck muscles relax and loosen. Repeat __________ times. Complete the exercise __________ times per day. STRENGTHENING EXERCISES - Cervical Strain and Sprain These exercises may help you when beginning to rehabilitate your injury. They may resolve your symptoms with or without further involvement from your physician, physical therapist, or athletic trainer. While completing these exercises, remember:   Muscles can gain both the endurance and the strength needed for everyday activities through controlled exercises.  Complete these exercises as instructed by your physician, physical therapist, or athletic trainer. Progress the resistance and repetitions only as guided.  You may experience muscle soreness or fatigue, but the pain or discomfort you are trying to eliminate should never worsen during these exercises. If this pain does worsen, stop and make certain you are following the directions exactly. If the pain is still present after adjustments, discontinue the exercise until you can discuss the trouble with your clinician. STRENGTH - Cervical Flexors, Isometric  Face a wall, standing about 6 inches away. Place a small pillow, a ball about 6-8 inches in diameter, or a folded towel between your forehead and the wall.  Slightly tuck your chin and gently push your forehead into the soft object. Push only with mild to moderate intensity, building up tension gradually. Keep your jaw  and forehead relaxed.  Hold 10 to 20 seconds. Keep your breathing relaxed.  Release the tension slowly. Relax your neck muscles completely before you start the next repetition. Repeat __________ times. Complete this exercise __________ times per day. STRENGTH- Cervical Lateral Flexors, Isometric   Stand about 6 inches away from a wall. Place a small pillow, a ball about 6-8 inches in diameter, or a folded towel between the side of your head and the wall.  Slightly tuck your chin and gently tilt your head into the soft object. Push only with mild to moderate intensity, building up tension gradually. Keep your jaw and forehead relaxed.  Hold 10 to 20 seconds. Keep your breathing relaxed.  Release the tension slowly. Relax your neck muscles completely before you start the next repetition. Repeat __________ times. Complete this exercise __________ times per day. STRENGTH - Cervical Extensors, Isometric   Stand about 6 inches away from a wall. Place a small pillow, a ball about 6-8 inches in diameter, or a folded towel between the back of your head and the wall.  Slightly tuck your chin and gently tilt your head back into the soft object. Push only with mild to moderate intensity, building up tension gradually. Keep your jaw and forehead  relaxed.  Hold 10 to 20 seconds. Keep your breathing relaxed.  Release the tension slowly. Relax your neck muscles completely before you start the next repetition. Repeat __________ times. Complete this exercise __________ times per day. POSTURE AND BODY MECHANICS CONSIDERATIONS - Cervical Strain and Sprain Keeping correct posture when sitting, standing or completing your activities will reduce the stress put on different body tissues, allowing injured tissues a chance to heal and limiting painful experiences. The following are general guidelines for improved posture. Your physician or physical therapist will provide you with any instructions specific to your  needs. While reading these guidelines, remember:  The exercises prescribed by your provider will help you have the flexibility and strength to maintain correct postures.  The correct posture provides the optimal environment for your joints to work. All of your joints have less wear and tear when properly supported by a spine with good posture. This means you will experience a healthier, less painful body.  Correct posture must be practiced with all of your activities, especially prolonged sitting and standing. Correct posture is as important when doing repetitive low-stress activities (typing) as it is when doing a single heavy-load activity (lifting). PROLONGED STANDING WHILE SLIGHTLY LEANING FORWARD When completing a task that requires you to lean forward while standing in one place for a long time, place either foot up on a stationary 2- to 4-inch high object to help maintain the best posture. When both feet are on the ground, the low back tends to lose its slight inward curve. If this curve flattens (or becomes too large), then the back and your other joints will experience too much stress, fatigue more quickly, and can cause pain.  RESTING POSITIONS Consider which positions are most painful for you when choosing a resting position. If you have pain with flexion-based activities (sitting, bending, stooping, squatting), choose a position that allows you to rest in a less flexed posture. You would want to avoid curling into a fetal position on your side. If your pain worsens with extension-based activities (prolonged standing, working overhead), avoid resting in an extended position such as sleeping on your stomach. Most people will find more comfort when they rest with their spine in a more neutral position, neither too rounded nor too arched. Lying on a non-sagging bed on your side with a pillow between your knees, or on your back with a pillow under your knees will often provide some relief. Keep in  mind, being in any one position for a prolonged period of time, no matter how correct your posture, can still lead to stiffness. WALKING Walk with an upright posture. Your ears, shoulders, and hips should all line up. OFFICE WORK When working at a desk, create an environment that supports good, upright posture. Without extra support, muscles fatigue and lead to excessive strain on joints and other tissues. CHAIR:  A chair should be able to slide under your desk when your back makes contact with the back of the chair. This allows you to work closely.  The chair's height should allow your eyes to be level with the upper part of your monitor and your hands to be slightly lower than your elbows.  Body position:  Your feet should make contact with the floor. If this is not possible, use a foot rest.  Keep your ears over your shoulders. This will reduce stress on your neck and low back.   This information is not intended to replace advice given to you by your health  care provider. Make sure you discuss any questions you have with your health care provider.   Document Released: 01/01/2005 Document Revised: 01/22/2014 Document Reviewed: 04/15/2008 [employer] Interactive Patient Education [employer].

## 2015-09-22 LAB — VITAMIN D 25 HYDROXY (VIT D DEFICIENCY, FRACTURES): Vit D, 25-Hydroxy: 16 ng/mL — ABNORMAL LOW (ref 30–100)

## 2015-10-12 ENCOUNTER — Encounter: Payer: Self-pay | Admitting: Neurology

## 2015-10-12 ENCOUNTER — Ambulatory Visit (INDEPENDENT_AMBULATORY_CARE_PROVIDER_SITE_OTHER): Payer: BLUE CROSS/BLUE SHIELD | Admitting: Neurology

## 2015-10-12 VITALS — BP 122/80 | HR 98 | Ht 59.0 in | Wt 169.6 lb

## 2015-10-12 DIAGNOSIS — R55 Syncope and collapse: Secondary | ICD-10-CM | POA: Diagnosis not present

## 2015-10-12 DIAGNOSIS — F05 Delirium due to known physiological condition: Secondary | ICD-10-CM

## 2015-10-12 DIAGNOSIS — R404 Transient alteration of awareness: Secondary | ICD-10-CM | POA: Diagnosis not present

## 2015-10-12 NOTE — Patient Instructions (Addendum)
Remember to drink plenty of fluid, eat healthy meals and do not skip any meals. Try to eat protein with a every meal and eat a healthy snack such as fruit or nuts in between meals. Try to keep a regular sleep-wake schedule and try to exercise daily, particularly in the form of walking, 20-30 minutes a day, if you can.   As far as diagnostic testing: MRI of the brain, EEG  You are unable to drive, operate heavy machinery, perform activities at heights or participate in water activities until 6 months event free   Our phone number is [phone]. We also have an after hours call service for urgent matters and there is a physician on-call for urgent questions. For any emergencies you know to call 911 or go to the nearest emergency room

## 2015-10-12 NOTE — Progress Notes (Signed)
[facility]    Provider:  Dr Jaynee Eagles Referring Provider: Wardell Honour, MD Primary Care Physician:  Wardell Honour, MD  CC:  Alteration of consciousness  HPI:  Rebekah [name] is a 60 y.o. female here as a referral from Dr. Tamala Julian for alteration of consciousness. Past medical history depression, anxiety, fibromyalgia. She has episodes that started 10 years ago maybe earlier(per [facility] notes has been ongoing for 10 years and at [facility] Dxed with severe postural hypotension associated with vaso-vagal manifestations)  but def worsened in 2014. She had 6 events on the way to the hospital when her daughter was pregnant. She was no admitted to [facility]. She was sent home. She gets a feeling coming up to her head and she "blacks out" and gets weak. She does not remember. She is here alone. She can hear everyone when it is happening but she can;t communicate she "zones out". She does not fall on the ground, has never fallen or hurt herself. No tongue biting or urination. She loses all strength. She remembers having the episode and she knows where she is when she "comes to". They last 2-5 minutes with no confusion afterwards. She feels them coming on, she can sit down, She feel lightheaded and feels her vision dimming. The episodes happen once a month. The last one was 25th of august and then on September 4th. She was driving the last one. Unknown triggers, not in the setting of stress, can be sitting or standing or laying down in bed. No other associated symptoms or modifying factors.No FHx of seizures. per husband she feels when it is coming, seemed liked she was goinmg to sleep, eyes closed during episodes, after a few seconds she becomes limp, up to 10 minutes, she wakes up and after a few seconds she knows what happened takes 30 minutes to regain strength. She can hear during episodes but can't tell what he says.    Reviewed notes, labs and imaging from outside physicians, which  showed:  BUN 14, creatinine 0.8, TSH 1.27 September 2015.   Per [facility] notes 2014, reviewed notes:   The patient has a history of hypertension for several years, but no history of myocardial infarction, angina, heart failure, symptomatic rhythm disturbances or valvular heart disease. Accompanied by her daughter, who provides much of the history. The patient was "somewhat sketchy about details of her remote history", but said frequent episodes of "passing out" over the course of 10 years or more.  Since 2011 experiencing intermittent episodes in which she became essentially unresponsive, associated with dimming out of her vision and a vague history of pressure from her diaphragm upward to her head. These episodes generally last from 2-5 minutes, and spontaneously dissipate. She says that she can usually hear other people talking during these times, but is unable to respond. No witnessed seizure activity or actual loss of consciousness. These episodes previously occurred only once or twice a month, and she said that she has never been evaluated for them. However, more recently had become considerably more frequent, and for that reason she presented for evaluation at the [facility] emergency department on 05/21/2012. At that time her diagnostic evaluation was unrevealing, with a normal electrocardiogram and otherwise unremarkable laboratory studies.The patient's daughter reported that her episodes associated with an apparently normal heart rhythm, and she had been able to measure her blood pressure during these spells.    1. Dizziness and unresponsiveness As demonstrated during her visit, the patient diagnosed with episodes of severe  postural hypotension associated with vaso-vagal manifestations.It was recommended discontinuing her anti-hypertensive medication, and making a concerted effort to maintain a well-hydrated state.   Pertinent other findings: Supine blood pressure was recorded at 120/85. The patient was  then asked to stand, and the blood pressure was recorded soon afterwards at [id number] systolic. After approximately a minute, the patient began to feel oddly and appeared less responsive. Her blood pressure was recorded at approximately 80 systolic. She was then assisted to a chair, but her blood pressure then became difficult to determine, and her heart rate was noted to be in the 40 range. She became less responsive and reported nausea, and she was carefully assisted to the floor, and her feet elevated. Over the course of approximately 5 minutes she gradually improved, and returned to her baseline status, with systolic blood pressure in the 120 range.    CT head 2014(report only no images available): No hemorrhage, extra-axial fluid collection, cerebral edema, acute cortical infarction, mass, mass effect, midline shift. Ventricles and sulci are normal for patient age. Normal paranasal sinuses, orbits and cranium.  IMPRESSION: No acute intracranial abnormalities.  LP 2014 showed nml CSF    Review of Systems: Patient complains of symptoms per HPI as well as the following symptoms: fatigue, headache, joint pain, aching muscles, depression, anxiety. Pertinent negatives per HPI. All others negative.   Social History   Social History  . Marital status: Married    Spouse name: N/A  . Number of children: 4  . Years of education: 8   Occupational History  . [employer]    Social History Main Topics  . Smoking status: Never Smoker  . Smokeless tobacco: Never Used  . Alcohol use No  . Drug use: No  . Sexual activity: Not on file   Other Topics Concern  . Not on file   Social History Narrative   Lives with daughter, [name]   Caffeine use: coffee/tea/soda daily    Family History  Problem Relation Age of Onset  . Cancer Mother     breast   . Diabetes Brother   . Hypertension Brother   . Stroke Brother   . Cancer Maternal Grandmother     breast    Past Medical History:   Diagnosis Date  . Asthma   . Depression   . Fibromyalgia     Past Surgical History:  Procedure Laterality Date  . BREAST SURGERY Right    biopsy in the 1990's  . BUNIONECTOMY Bilateral    2000's - R twice and L once  . HAND SURGERY Bilateral    2000's  . TUBAL LIGATION  1984    Current Outpatient Prescriptions  Medication Sig Dispense Refill  . DULoxetine (CYMBALTA) 30 MG capsule Three tablets daily 90 capsule 3  . meloxicam (MOBIC) 15 MG tablet Take 1 tablet (15 mg total) by mouth daily. 30 tablet 0  . methocarbamol (ROBAXIN) 500 MG tablet Take 1-2 tablets (500-1,000 mg total) by mouth every 8 (eight) hours as needed for muscle spasms. 45 tablet 0   No current facility-administered medications for this visit.     Allergies as of 10/12/2015 - Review Complete 10/12/2015  Allergen Reaction Noted  . Zolpidem tartrate  08/27/2014  . Carisoprodol Other (See Comments) 08/27/2014    Vitals: BP 122/80 (BP Location: Right Arm, Patient Position: Sitting, Cuff Size: Normal)   Pulse 98   Ht 4\' 11"  (1.499 m)   Wt 169 lb 9.6 oz (76.9 kg)   BMI 34.26  kg/m  Last Weight:  Wt Readings from Last 1 Encounters:  10/12/15 169 lb 9.6 oz (76.9 kg)   Last Height:   Ht Readings from Last 1 Encounters:  10/12/15 4\' 11"  (1.499 m)   Physical exam: Exam: Gen: NAD, conversant, well nourised, obese, well groomed                     CV: RRR, no MRG. No Carotid Bruits. No peripheral edema, warm, nontender Eyes: Conjunctivae clear without exudates or hemorrhage  Neuro: Detailed Neurologic Exam  Speech:    Speech is normal; fluent and spontaneous with normal comprehension.  Cognition:    The patient is oriented to person, place, and time;     recent and remote memory intact;     language fluent;     normal attention, concentration,     fund of knowledge Cranial Nerves:    The pupils are equal, round, and reactive to light. The fundi are normal and spontaneous venous pulsations are  present. Visual fields are full to finger confrontation. Extraocular movements are intact. Trigeminal sensation is intact and the muscles of mastication are normal. The face is symmetric. The palate elevates in the midline. Hearing intact. Voice is normal. Shoulder shrug is normal. The tongue has normal motion without fasciculations.   Coordination:    Normal finger to nose and heel to shin. Normal rapid alternating movements.   Gait:    Heel-toe and tandem gait are normal.   Motor Observation:    No asymmetry, no atrophy, and no involuntary movements noted. Tone:    Normal muscle tone.    Posture:    Posture is normal. normal erect    Strength:    Strength is V/V in the upper and lower limbs.      Sensation: intact to LT     Reflex Exam:  DTR's:    Deep tendon reflexes in the upper and lower extremities are normal bilaterally.   Toes:    The toes are downgoing bilaterally.   Clonus:    Clonus is absent.  Orthostatic VS for the past 24 hrs (Last 3 readings):  BP- Lying Pulse- Lying BP- Sitting Pulse- Sitting BP- Standing at 3 minutes Pulse- Standing at 3 minutes  10/12/15 1115 128/70 80 110/70 87 105/80 88       Assessment/Plan:    Rebekah [name] is a 60 y.o. female here as a referral from Dr. Tamala Julian for alteration of consciousness episodes for over 10 years. Past medical history depression, anxiety, fibromyalgia. She has episodes that started 10 years ago maybe earlier(per [facility] notes has been ongoing for 10 years and at [facility] Dxed with severe postural hypotension associated with vaso-vagal manifestations) . Episodes where she feels them coming on, her vision dims, her eyes close and she goes limp, she can hear people but can;t respond, no urination or tongue biting. Can last up to 10 minutes with eyes closed. Does not appear to be seizure events, may be vasovagal as per [facility] or non-epileptic events. Needs evaluation. Will perform EEG, MRI brain w/w/o contrast, then possibly  ambulatory or inpatient long-term eeg if necessary.  Patient is unable to drive, operate heavy machinery, perform activities at heights or participate in water activities until 6 months event free  Patient's systolic did decrease by close to 18 systolic from laying to standing. Advised good hydration. Will work up for seizures however feel this is less likely given history.   Cc: Wardell Honour, MD  Sarina Ill, MD  [facility] [address] [place], [place] [address]  Phone [phone] Fax [phone]

## 2015-10-27 ENCOUNTER — Ambulatory Visit (INDEPENDENT_AMBULATORY_CARE_PROVIDER_SITE_OTHER): Payer: BLUE CROSS/BLUE SHIELD | Admitting: Family Medicine

## 2015-10-27 VITALS — BP 120/90 | HR 93 | Temp 98.4°F | Resp 17 | Ht 59.0 in | Wt 166.0 lb

## 2015-10-27 DIAGNOSIS — M25512 Pain in left shoulder: Secondary | ICD-10-CM | POA: Diagnosis not present

## 2015-10-27 DIAGNOSIS — R404 Transient alteration of awareness: Secondary | ICD-10-CM | POA: Diagnosis not present

## 2015-10-27 DIAGNOSIS — I951 Orthostatic hypotension: Secondary | ICD-10-CM

## 2015-10-27 DIAGNOSIS — F419 Anxiety disorder, unspecified: Principal | ICD-10-CM

## 2015-10-27 DIAGNOSIS — Z23 Encounter for immunization: Secondary | ICD-10-CM | POA: Diagnosis not present

## 2015-10-27 DIAGNOSIS — M542 Cervicalgia: Secondary | ICD-10-CM

## 2015-10-27 DIAGNOSIS — M25511 Pain in right shoulder: Secondary | ICD-10-CM | POA: Diagnosis not present

## 2015-10-27 DIAGNOSIS — F418 Other specified anxiety disorders: Secondary | ICD-10-CM | POA: Diagnosis not present

## 2015-10-27 DIAGNOSIS — F329 Major depressive disorder, single episode, unspecified: Secondary | ICD-10-CM

## 2015-10-27 DIAGNOSIS — E559 Vitamin D deficiency, unspecified: Secondary | ICD-10-CM | POA: Diagnosis not present

## 2015-10-27 DIAGNOSIS — F32A Depression, unspecified: Secondary | ICD-10-CM

## 2015-10-27 MED ORDER — PREDNISONE 20 MG PO TABS: ORAL_TABLET | ORAL | 0 refills | Status: DC

## 2015-10-27 MED ORDER — VITAMIN D (ERGOCALCIFEROL) 1.25 MG (50000 UNIT) PO CAPS: 50000.0000 [IU] | ORAL_CAPSULE | ORAL | 0 refills | Status: DC

## 2015-10-27 NOTE — Patient Instructions (Addendum)
1.  Start neck exercises daily. 2. Get on exercise machine at home for 20 minutes each day.    IF you received an x-ray today, you will receive an invoice from [facility]. Please contact [facility] at [phone] with questions or concerns regarding your invoice.   IF you received labwork today, you will receive an invoice from [employer]. Please contact [facility] at [phone] with questions or concerns regarding your invoice.   Our billing staff will not be able to assist you with questions regarding bills from these companies.  You will be contacted with the lab results as soon as they are available. The fastest way to get your results is to activate your My Chart account. Instructions are located on the last page of this paperwork. If you have not heard from [place] regarding the results in 2 weeks, please contact this office.

## 2015-10-27 NOTE — Progress Notes (Signed)
Subjective:    Patient ID: Rebekah [name], female    DOB: 02-Oct-1955, 60 y.o.   MRN: [id number]  10/27/2015  Arm Pain (and hand pain mainly rt side but some pain in left hand has had carpal tunnel surgery) and Depression (Onset 1 month/ husband left )   HPI This 60 y.o. female presents for one month follow-up of anxiety/depression, fibromyalgia, neck pain, transient alteration of awareness.  Vitamin D level is low at 16.  Vitamin B12 is low normal at 344.  CBC, CMET, TSH all WNL.   R hand xray WNL.  R shoulder xray WNL.   Cervical spine films WNL.  Prescribed Mobic, Robaxin, increased Cymbalta 3 tablets; referred to neurology to rule out seizure disorder.  S/p neurology consultation by Dr. Jaynee Eagles.  [facility] severe post[facility] hypot[facility] ten years ago; referred for MRI brain and EEG.  Scheduled for EEG in one week; still awaiting appointment for MRI; awaiting insurance approval.  Excessive worry and paranoia; afraid with daughter gone; cried self asleep every night; still loves patient; very sad; does not understand why but that is what he wants.  Lives together with daughter.  Neurologist face timed husband to determine what spells occur.  Advised not to drive.  Stays home; does not work; husband trying to support patient.   Sister lives in [place]; very close with sister.  R hand: R upper arm with crocheting; gets spasms and R finger hurts.  Pain in neck; still gets a lot of spasm; taking Robaxin tid with good relief.B shoulder pains; across shoulders and neck.  No previous orthopedist; also has heel spurs.  No previous physical therapy.  More depressed from last visit.  Really misses husband who moved to [place] with teenage daughter; denies SI.  Just really sad.  Has become really paranoid in the past month.  No hallucinations.  Fibromyalgia:  Diagnosed years ago.     Review of Systems  Constitutional: Negative for chills, diaphoresis, fatigue and fever.  Eyes: Negative for visual disturbance.   Respiratory: Negative for cough and shortness of breath.   Cardiovascular: Negative for chest pain, palpitations and leg swelling.  Gastrointestinal: Negative for abdominal pain, constipation, diarrhea, nausea and vomiting.  Endocrine: Negative for cold intolerance, heat intolerance, polydipsia, polyphagia and polyuria.  Musculoskeletal: Positive for arthralgias, myalgias, neck pain and neck stiffness.  Neurological: Negative for dizziness, tremors, seizures, syncope, facial asymmetry, speech difficulty, weakness, light-headedness, numbness and headaches.  Psychiatric/Behavioral: Positive for dysphoric mood. Negative for hallucinations, self-injury, sleep disturbance and suicidal ideas. The patient is nervous/anxious.     Past Medical History:  Diagnosis Date  . Asthma   . Depression   . Fibromyalgia    Past Surgical History:  Procedure Laterality Date  . BREAST SURGERY Right    biopsy in the 1990's  . BUNIONECTOMY Bilateral    2000's - R twice and L once  . HAND SURGERY Bilateral    2000's  . TUBAL LIGATION  1984   Allergies  Allergen Reactions  . Zolpidem Tartrate     Other reaction(s): Hallucination  . Carisoprodol Other (See Comments)    chills    Social History   Social History  . Marital status: Married    Spouse name: N/A  . Number of children: 4  . Years of education: 8   Occupational History  . [employer]    Social History Main Topics  . Smoking status: Never Smoker  . Smokeless tobacco: Never Used  . Alcohol use No  .  Drug use: No  . Sexual activity: Not on file   Other Topics Concern  . Not on file   Social History Narrative   Lives with daughter, [name]   Caffeine use: coffee/tea/soda daily   Family History  Problem Relation Age of Onset  . Cancer Mother     breast   . Diabetes Brother   . Hypertension Brother   . Stroke Brother   . Cancer Maternal Grandmother     breast       Objective:    BP 120/90 (BP Location: Left Arm,  Cuff Size: Normal)   Pulse 93   Temp 98.4 F (36.9 C) (Oral)   Resp 17   Ht _0  (1.499 m)   Wt 166 lb (75.3 kg)   SpO2 96%   BMI 33.53 kg/m  Physical Exam  Constitutional: She is oriented to person, place, and time. She appears well-developed and well-nourished. No distress.  HENT:  Head: Normocephalic and atraumatic.  Right Ear: External ear normal.  Left Ear: External ear normal.  Nose: Nose normal.  Mouth/Throat: Oropharynx is clear and moist.  Eyes: Conjunctivae and EOM are normal. Pupils are equal, round, and reactive to light.  Neck: Normal range of motion. Neck supple. Carotid bruit is not present. No thyromegaly present.  Cardiovascular: Normal rate, regular rhythm, normal heart sounds and intact distal pulses.  Exam reveals no gallop and no friction rub.   No murmur heard. Pulmonary/Chest: Effort normal and breath sounds normal. She has no wheezes. She has no rales.  Abdominal: Soft. Bowel sounds are normal. She exhibits no distension and no mass. There is no tenderness. There is no rebound and no guarding.  Musculoskeletal:       Right wrist: Normal. She exhibits normal range of motion, no tenderness, no bony tenderness and no deformity.       Cervical back: She exhibits decreased range of motion, pain and spasm. She exhibits no tenderness and no bony tenderness.       Right upper arm: Normal. She exhibits no tenderness, no bony tenderness, no swelling, no edema and no deformity.       Left upper arm: Normal.       Right forearm: Normal. She exhibits no tenderness, no bony tenderness, no swelling and no edema.       Right hand: Normal. She exhibits normal range of motion, no tenderness and no bony tenderness. Normal sensation noted. Normal strength noted.  Lymphadenopathy:    She has no cervical adenopathy.  Neurological: She is alert and oriented to person, place, and time. No cranial nerve deficit.  Skin: Skin is warm and dry. No rash noted. She is not diaphoretic. No  erythema. No pallor.  Psychiatric: She has a normal mood and affect. Her behavior is normal.   Results for orders placed or performed in visit on 09/21/15  CBC with Differential/Platelet  Result Value Ref Range   WBC 4.7 3.8 - 10.8 K/uL   RBC 4.46 3.80 - 5.10 MIL/uL   Hemoglobin 14.4 11.7 - 15.5 g/dL   HCT 42.0 35.0 - 45.0 %   MCV 94.2 80.0 - 100.0 fL   MCH 32.3 27.0 - 33.0 pg   MCHC 34.3 32.0 - 36.0 g/dL   RDW 12.9 11.0 - 15.0 %   Platelets 272 140 - 400 K/uL   MPV 10.3 7.5 - 12.5 fL   Neutro Abs 2,256 1,500 - 7,800 cells/uL   Lymphs Abs 1,927 850 - 3,900 cells/uL  Monocytes Absolute 517 200 - 950 cells/uL   Eosinophils Absolute 0 (L) 15 - 500 cells/uL   Basophils Absolute 0 0 - 200 cells/uL   Neutrophils Relative % 48 %   Lymphocytes Relative 41 %   Monocytes Relative 11 %   Eosinophils Relative 0 %   Basophils Relative 0 %   Smear Review Criteria for review not met   Comprehensive metabolic panel  Result Value Ref Range   Sodium 141 135 - 146 mmol/L   Potassium 4.2 3.5 - 5.3 mmol/L   Chloride 104 98 - 110 mmol/L   CO2 28 20 - 31 mmol/L   Glucose, Bld 89 65 - 99 mg/dL   BUN 14 7 - 25 mg/dL   Creat 0.80 0.50 - 1.05 mg/dL   Total Bilirubin 0.5 0.2 - 1.2 mg/dL   Alkaline Phosphatase 63 33 - 130 U/L   AST 21 10 - 35 U/L   ALT 22 6 - 29 U/L   Total Protein 7.0 6.1 - 8.1 g/dL   Albumin 4.2 3.6 - 5.1 g/dL   Calcium 9.2 8.6 - 10.4 mg/dL  TSH  Result Value Ref Range   TSH 1.12 mIU/L  Vitamin B12  Result Value Ref Range   Vitamin B-12 344 200 - 1,100 pg/mL  VITAMIN D 25 Hydroxy (Vit-D Deficiency, Fractures)  Result Value Ref Range   Vit D, 25-Hydroxy 16 (L) 30 - 100 ng/mL  POCT urinalysis dipstick  Result Value Ref Range   Color, UA yellow yellow   Clarity, UA clear clear   Glucose, UA negative negative   Bilirubin, UA negative negative   Ketones, POC UA negative negative   Spec Grav, UA 1.020    Blood, UA trace-lysed (A) negative   pH, UA 6.0    Protein Ur, POC  negative negative   Urobilinogen, UA 0.2    Nitrite, UA Negative Negative   Leukocytes, UA large (3+) (A) Negative   Depression screen [facility] 2/9 10/27/2015 09/21/2015 09/21/2015 07/12/2015 04/20/2015  Decreased Interest 0 2 0 0 2  Down, Depressed, Hopeless 3 3 0 0 2  PHQ - 2 Score 3 5 0 0 4  Altered sleeping 1 0 - - 0  Tired, decreased energy 1 2 - - 0  Change in appetite 3 0 - - 0  Feeling bad or failure about yourself  3 3 - - 2  Trouble concentrating 0 0 - - 0  Moving slowly or fidgety/restless 0 0 - - 2  Suicidal thoughts 0 0 - - 0  PHQ-9 Score 11 10 - - 8  Difficult doing work/chores Not difficult at all Somewhat difficult - - Somewhat difficult      BP Readings from Last 3 Encounters:  10/27/15 120/90  10/12/15 122/80  09/21/15 130/90    Wt Readings from Last 3 Encounters:  10/27/15 166 lb (75.3 kg)  10/12/15 169 lb 9.6 oz (76.9 kg)  09/21/15 167 lb (75.8 kg)   Assessment & Plan:   1. Anxiety and depression   2. Neck pain   3. Pain of both shoulder joints   4. Vitamin D deficiency   5. Alteration consciousness   6. Postural hypotension   7. Need for prophylactic vaccination and inoculation against influenza    -worsening depression due to family stressors; counseling provided during visit; continue Cymbalta at current dose; good family support; denies SI. -persistent neck pain with B shoulder pain versus radicular symptoms; refer to ortho for further evaluation; rx for Prednisone  provided; hold Meloxicam while taking Prednisone; continue home exercise program.  Consider physical therapy. -rx for Vitamin D weekly provided. -s/p neurology consultation for alteration of consciousness; scheduled for EEG and to be scheduled for MRI.  Reviewed CareEverywhere; s/p cardiology consultation in past several years; diagnosed with postural hypotension in past 5 years by [facility] cardiology.   Orders Placed This Encounter  Procedures  . Flu Vaccine QUAD 36+ mos IM  . Ambulatory referral  to Orthopedic Surgery    Referral Priority:   Routine    Referral Type:   Surgical    Referral Reason:   Specialty Services Required    Requested Specialty:   Orthopedic Surgery    Number of Visits Requested:   1   Meds ordered this encounter  Medications  . Vitamin D, Ergocalciferol, (DRISDOL) 50000 units CAPS capsule    Sig: Take 1 capsule (50,000 Units total) by mouth every 7 (seven) days.    Dispense:  12 capsule    Refill:  0  . predniSONE (DELTASONE) 20 MG tablet    Sig: Two tablets daily x 5 days then one tablet daily x 5 days    Dispense:  15 tablet    Refill:  0    Return in about 2 months (around 12/27/2015) for recheck anxiety/depression, joint pains.   Kristi Elayne Guerin, M.D. [facility] [address] [place], [place]  [address] [phone] phone [phone] fax

## 2015-11-02 ENCOUNTER — Ambulatory Visit (INDEPENDENT_AMBULATORY_CARE_PROVIDER_SITE_OTHER): Payer: BLUE CROSS/BLUE SHIELD

## 2015-11-02 DIAGNOSIS — F05 Delirium due to known physiological condition: Secondary | ICD-10-CM

## 2015-11-02 DIAGNOSIS — R55 Syncope and collapse: Secondary | ICD-10-CM

## 2015-11-02 DIAGNOSIS — R404 Transient alteration of awareness: Secondary | ICD-10-CM

## 2015-11-03 NOTE — Procedures (Signed)
    History:  Rebekah [name] is a 60 year old patient with a history of alteration of consciousness. The patient has had episodes that began about 10 years ago that were diagnosed as vasovagal events. The patient may get weak and then blackout. The patient does not fall down if she is standing. She may have a lightheaded sensation with visual dimming. She is being evaluated for these events.  This is a routine EEG. No skull defects are noted. Medications include Cymbalta, Mobic, and Robaxin.   EEG classification: Normal awake  Description of the recording: The background rhythms of this recording consists of a fairly well modulated medium amplitude alpha rhythm of 9 Hz that is reactive to eye opening and closure. As the record progresses, the patient appears to remain in the waking state throughout the recording. Photic stimulation was performed, resulting in a bilateral and symmetric photic driving response. Hyperventilation was also performed, resulting in a minimal buildup of the background rhythm activities without significant slowing seen. At no time during the recording does there appear to be evidence of spike or spike wave discharges or evidence of focal slowing. EKG monitor shows no evidence of cardiac rhythm abnormalities with a heart rate of 78.  Impression: This is a normal EEG recording in the waking state. No evidence of ictal or interictal discharges are seen.

## 2015-11-07 ENCOUNTER — Encounter: Payer: Self-pay | Admitting: Neurology

## 2015-11-07 DIAGNOSIS — I951 Orthostatic hypotension: Secondary | ICD-10-CM | POA: Insufficient documentation

## 2015-11-08 ENCOUNTER — Encounter (INDEPENDENT_AMBULATORY_CARE_PROVIDER_SITE_OTHER): Payer: Self-pay | Admitting: Sports Medicine

## 2015-11-08 ENCOUNTER — Encounter: Payer: Self-pay | Admitting: *Deleted

## 2015-11-08 NOTE — Progress Notes (Signed)
Faxed completed form to [facility] to schedule pt for in-home 72-hour EEG. Fax: [phone]. Received confirmation.   

## 2015-11-09 ENCOUNTER — Telehealth: Payer: Self-pay | Admitting: Neurology

## 2015-11-09 NOTE — Telephone Encounter (Signed)
Tammy/[facility] [phone] called to advise the order needs length of monitoring, cc of last OV and cc of EEG

## 2015-11-09 NOTE — Telephone Encounter (Signed)
Re-faxed order to include 72-hr for length of EEG, office note and EEG results as requested. Received fax confirmation.

## 2015-11-14 ENCOUNTER — Ambulatory Visit
Admission: RE | Admit: 2015-11-14 | Discharge: 2015-11-14 | Disposition: A | Discharge status: Home / Self Care | Payer: No Typology Code available for payment source | Source: Ambulatory Visit | Attending: Neurology | Admitting: Neurology

## 2015-11-14 DIAGNOSIS — R55 Syncope and collapse: Secondary | ICD-10-CM

## 2015-11-14 DIAGNOSIS — F05 Delirium due to known physiological condition: Secondary | ICD-10-CM | POA: Diagnosis not present

## 2015-11-14 DIAGNOSIS — R404 Transient alteration of awareness: Secondary | ICD-10-CM

## 2015-11-14 MED ORDER — GADOBENATE DIMEGLUMINE 529 MG/ML IV SOLN
15.0000 mL | Freq: Once | INTRAVENOUS | Status: AC | PRN
  Administered 2015-11-14: 15 mL via INTRAVENOUS

## 2015-11-15 ENCOUNTER — Encounter: Payer: Self-pay | Admitting: Neurology

## 2015-11-15 ENCOUNTER — Telehealth: Payer: Self-pay | Admitting: *Deleted

## 2015-11-15 DIAGNOSIS — R569 Unspecified convulsions: Secondary | ICD-10-CM | POA: Diagnosis not present

## 2015-11-15 NOTE — Telephone Encounter (Signed)
-----   Message from Melvenia Beam, MD sent at 11/15/2015  1:23 PM EDT ----- Mri of the brain and mra of the head unremarkable thanks

## 2015-11-15 NOTE — Telephone Encounter (Signed)
Called and spoke to pt about MRI/MRA results per Dr Jaynee Eagles note. She verbalized understanding.  [facility] was there now getting her set up for 3-day in home EEG. Advised  I will call once we receive results from that when she is completed with study.

## 2015-11-16 ENCOUNTER — Ambulatory Visit (INDEPENDENT_AMBULATORY_CARE_PROVIDER_SITE_OTHER): Payer: BLUE CROSS/BLUE SHIELD | Admitting: Sports Medicine

## 2015-11-16 DIAGNOSIS — R569 Unspecified convulsions: Secondary | ICD-10-CM | POA: Diagnosis not present

## 2015-11-17 ENCOUNTER — Encounter: Payer: Self-pay | Admitting: Family Medicine

## 2015-11-17 DIAGNOSIS — Z1211 Encounter for screening for malignant neoplasm of colon: Secondary | ICD-10-CM

## 2015-11-17 DIAGNOSIS — R569 Unspecified convulsions: Secondary | ICD-10-CM | POA: Diagnosis not present

## 2015-11-18 ENCOUNTER — Encounter: Payer: Self-pay | Admitting: Family Medicine

## 2015-11-21 ENCOUNTER — Encounter: Payer: Self-pay | Admitting: Gastroenterology

## 2015-11-21 NOTE — Addendum Note (Signed)
Addended by: Wardell Honour on: 11/21/2015 01:35 PM   Modules accepted: Orders

## 2015-11-21 NOTE — Telephone Encounter (Signed)
Received physician status notification from [facility] that the study is completed and has been loaded into server. They will send notification within 48 business hrs when report is scanned and ready for report to be generated for Dr Junius Argyle to review/interpret.

## 2015-11-22 ENCOUNTER — Ambulatory Visit (INDEPENDENT_AMBULATORY_CARE_PROVIDER_SITE_OTHER): Payer: BLUE CROSS/BLUE SHIELD | Admitting: Sports Medicine

## 2015-11-22 ENCOUNTER — Encounter: Payer: Self-pay | Admitting: Family Medicine

## 2015-11-22 ENCOUNTER — Encounter (INDEPENDENT_AMBULATORY_CARE_PROVIDER_SITE_OTHER): Payer: Self-pay | Admitting: Sports Medicine

## 2015-11-22 VITALS — BP 130/91 | HR 78 | Ht 59.0 in | Wt 165.0 lb

## 2015-11-22 DIAGNOSIS — M797 Fibromyalgia: Secondary | ICD-10-CM

## 2015-11-22 DIAGNOSIS — Z9889 Other specified postprocedural states: Secondary | ICD-10-CM | POA: Diagnosis not present

## 2015-11-22 DIAGNOSIS — R404 Transient alteration of awareness: Secondary | ICD-10-CM | POA: Diagnosis not present

## 2015-11-22 MED ORDER — GABAPENTIN 300 MG PO CAPS: ORAL_CAPSULE | ORAL | 1 refills | Status: DC
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
## 2015-11-23 NOTE — Telephone Encounter (Signed)
This is duplicate message, I believe about mammogram.

## 2015-11-23 NOTE — Telephone Encounter (Signed)
Replied to pt to give instr's and numbers to call.

## 2015-11-24 NOTE — Telephone Encounter (Signed)
Dr Jaynee EaglesJuluis Rainier Received physician status notification from [facility] that the study scanned and report generated by scanning department. Dr Junius Argyle needs to complete interpretation and will forward report to [place] once completed.

## 2015-11-29 ENCOUNTER — Encounter: Payer: Self-pay | Admitting: Neurology

## 2015-11-30 ENCOUNTER — Ambulatory Visit: Payer: BLUE CROSS/BLUE SHIELD | Admitting: Family Medicine

## 2015-12-02 ENCOUNTER — Ambulatory Visit (INDEPENDENT_AMBULATORY_CARE_PROVIDER_SITE_OTHER): Payer: BLUE CROSS/BLUE SHIELD | Admitting: Family Medicine

## 2015-12-02 VITALS — BP 122/72 | HR 95 | Temp 98.7°F | Resp 17 | Ht 59.0 in | Wt 170.0 lb

## 2015-12-02 DIAGNOSIS — R0789 Other chest pain: Secondary | ICD-10-CM | POA: Diagnosis not present

## 2015-12-02 DIAGNOSIS — R404 Transient alteration of awareness: Secondary | ICD-10-CM | POA: Diagnosis not present

## 2015-12-02 DIAGNOSIS — G4459 Other complicated headache syndrome: Secondary | ICD-10-CM | POA: Diagnosis not present

## 2015-12-02 DIAGNOSIS — M797 Fibromyalgia: Secondary | ICD-10-CM

## 2015-12-02 DIAGNOSIS — R55 Syncope and collapse: Secondary | ICD-10-CM

## 2015-12-02 DIAGNOSIS — I951 Orthostatic hypotension: Secondary | ICD-10-CM | POA: Diagnosis not present

## 2015-12-02 DIAGNOSIS — F418 Other specified anxiety disorders: Secondary | ICD-10-CM

## 2015-12-02 DIAGNOSIS — Z1231 Encounter for screening mammogram for malignant neoplasm of breast: Secondary | ICD-10-CM | POA: Diagnosis not present

## 2015-12-02 LAB — POCT URINALYSIS DIP (MANUAL ENTRY)
Bilirubin, UA: NEGATIVE
GLUCOSE UA: NEGATIVE
Ketones, POC UA: NEGATIVE
NITRITE UA: NEGATIVE
PH UA: 6.5
PROTEIN UA: NEGATIVE
Spec Grav, UA: 1.02
UROBILINOGEN UA: 1

## 2015-12-02 LAB — COMPREHENSIVE METABOLIC PANEL
ALBUMIN: 4.1 g/dL (ref 3.6–5.1)
ALT: 21 U/L (ref 6–29)
AST: 20 U/L (ref 10–35)
Alkaline Phosphatase: 80 U/L (ref 33–130)
BILIRUBIN TOTAL: 0.4 mg/dL (ref 0.2–1.2)
BUN: 11 mg/dL (ref 7–25)
CALCIUM: 9 mg/dL (ref 8.6–10.4)
CO2: 30 mmol/L (ref 20–31)
Chloride: 102 mmol/L (ref 98–110)
Creat: 0.79 mg/dL (ref 0.50–1.05)
Glucose, Bld: 88 mg/dL (ref 65–99)
Potassium: 3.7 mmol/L (ref 3.5–5.3)
Sodium: 138 mmol/L (ref 135–146)
TOTAL PROTEIN: 6.5 g/dL (ref 6.1–8.1)

## 2015-12-02 LAB — POCT CBC
GRANULOCYTE PERCENT: 58.3 % (ref 37–80)
HEMATOCRIT: 40.8 % (ref 37.7–47.9)
Hemoglobin: 14.1 g/dL (ref 12.2–16.2)
LYMPH, POC: 2.5 (ref 0.6–3.4)
MCH, POC: 32.6 pg — AB (ref 27–31.2)
MCHC: 34.5 g/dL (ref 31.8–35.4)
MCV: 94.5 fL (ref 80–97)
MID (CBC): 0.5 (ref 0–0.9)
MPV: 8.5 fL (ref 0–99.8)
PLATELET COUNT, POC: 225 10*3/uL (ref 142–424)
POC GRANULOCYTE: 4.2 (ref 2–6.9)
POC LYMPH %: 34.6 % (ref 10–50)
POC MID %: 7.1 %M (ref 0–12)
RBC: 4.32 M/uL (ref 4.04–5.48)
RDW, POC: 13.5 %
WBC: 7.2 10*3/uL (ref 4.6–10.2)

## 2015-12-02 LAB — GLUCOSE, POCT (MANUAL RESULT ENTRY): POC GLUCOSE: 83 mg/dL (ref 70–99)
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
## 2015-12-02 NOTE — Patient Instructions (Addendum)
1.  Present to the emergency department immediately if you have another passing out episode. 2.  Avoid driving until evaluated by cardiology.    IF you received an x-ray today, you will receive an invoice from [facility]. Please contact [facility] at [phone] with questions or concerns regarding your invoice.   IF you received labwork today, you will receive an invoice from [employer]. Please contact [facility] at [phone] with questions or concerns regarding your invoice.   Our billing staff will not be able to assist you with questions regarding bills from these companies.  You will be contacted with the lab results as soon as they are available. The fastest way to get your results is to activate your My Chart account. Instructions are located on the last page of this paperwork. If you have not heard from [place] regarding the results in 2 weeks, please contact this office.

## 2015-12-26 ENCOUNTER — Encounter: Payer: Self-pay | Admitting: Family Medicine

## 2016-01-03 ENCOUNTER — Ambulatory Visit (INDEPENDENT_AMBULATORY_CARE_PROVIDER_SITE_OTHER): Payer: BLUE CROSS/BLUE SHIELD | Admitting: Sports Medicine

## 2016-01-04 ENCOUNTER — Ambulatory Visit (INDEPENDENT_AMBULATORY_CARE_PROVIDER_SITE_OTHER): Payer: BLUE CROSS/BLUE SHIELD | Admitting: Cardiovascular Disease

## 2016-01-04 ENCOUNTER — Encounter: Payer: Self-pay | Admitting: Cardiovascular Disease

## 2016-01-04 VITALS — BP 134/97 | HR 92 | Ht 60.0 in | Wt 173.4 lb

## 2016-01-04 DIAGNOSIS — R030 Elevated blood-pressure reading, without diagnosis of hypertension: Secondary | ICD-10-CM

## 2016-01-04 DIAGNOSIS — R404 Transient alteration of awareness: Secondary | ICD-10-CM | POA: Diagnosis not present

## 2016-01-04 DIAGNOSIS — R55 Syncope and collapse: Secondary | ICD-10-CM | POA: Diagnosis not present

## 2016-01-04 NOTE — Patient Instructions (Signed)
Medication Instructions:  Your physician recommends that you continue on your current medications as directed. Please refer to the Current Medication list given to you today.  Labwork: NONE  Testing/Procedures: Your physician has recommended that you wear an event monitor. Event monitors are medical devices that record the heart's electrical activity. Doctors most often [place] these monitors to diagnose arrhythmias. Arrhythmias are problems with the speed or rhythm of the heartbeat. The monitor is a small, portable device. You can wear one while you do your normal daily activities. This is usually used to diagnose what is causing palpitations/syncope (passing out). [place] [address]  Follow-Up: AS NEEDED

## 2016-01-04 NOTE — Progress Notes (Signed)
Cardiology Office Note   Date:  01/04/2016   ID:  Rebekah [name], DOB 1955-07-10, MRN [id number]  PCP:  Rebekah Forts, MD  Cardiologist:   Rebekah Latch, MD   Chief Complaint  Patient presents with  . New Patient (Initial Visit)    experiencing seizures randomly. Pt states no other Sx.       History of Present Illness: Rebekah [name] is a 60 y.o. female with asthma and anxiety who presents for an evaluation of syncope. [name] saw Dr. Reginia [name] on 12/02/15. She reported an episode of syncope while sitting in a car.  Of note she had a neurological evaluation including an MRI and EEG 10/2015 which were unremarkable. She did not have one of her typical episodes while wearing the EEG. These episodes have been occurring intermittently for years.  She feels a sensation rising in her abdomen and then she becomes unresponsive.  She is unable to speak or move her body but she can hear everything that is going on around her.  Her daughter notes that the episodes last for 2-4 minutes.  There is no tonic-clonic movement, tongue-biting, loss of bowel or bladder function.  After the episode she is lethargic for the rest of the day.  This happens once or twice per month.  She reports being evaluated by a cardiologist in [name] before but doesn't remember who it was.  She notes that the work up was unremarkable.   [name] does not get formal exercise but she does clean houses for a living.  She denies chest pain, shortness of breath, palpitations, lightheadedness, or dizziness with this activity. She has not noted any lower extremity edema, orthopnea, or PND.    Past Medical History:  Diagnosis Date  . Asthma   . Depression   . Fibromyalgia     Past Surgical History:  Procedure Laterality Date  . BREAST SURGERY Right    biopsy in the 1990's  . BUNIONECTOMY Bilateral    2000's - R twice and L once  . HAND SURGERY Bilateral    2000's  . TUBAL  LIGATION  1984     Current Outpatient Prescriptions  Medication Sig Dispense Refill  . Doxylamine Succinate, Sleep, (SLEEP AID PO) Take by mouth.    . DULoxetine (CYMBALTA) 30 MG capsule Three tablets daily 90 capsule 3  . predniSONE (DELTASONE) 20 MG tablet TWO TABLETS DAILY X 5 DAYS THEN ONE TABLET DAILY X 5 DAYS  0   No current facility-administered medications for this visit.     Allergies:   Zolpidem tartrate and Carisoprodol    Social History:  The patient  reports that she has never smoked. She has never used smokeless tobacco. She reports that she does not drink alcohol or use drugs.   Family History:  The patient's family history includes Cancer in her maternal grandmother and mother; Diabetes in her brother; Hypertension in her brother; Stroke in her brother.    ROS:  Please see the history of present illness.   Otherwise, review of systems are positive for none.   All other systems are reviewed and negative.    PHYSICAL EXAM: VS:  BP (!) 134/97   Pulse 92   Ht 5' (1.524 m)   Wt 78.7 kg (173 lb 6.4 oz)   BMI 33.86 kg/m  , BMI Body mass index is 33.86 kg/m. GENERAL:  Well appearing HEENT:  Pupils equal round and reactive, fundi not visualized, oral mucosa unremarkable NECK:  No  jugular venous distention, waveform within normal limits, carotid upstroke brisk and symmetric, no bruits, no thyromegaly LYMPHATICS:  No cervical adenopathy LUNGS:  Clear to auscultation bilaterally HEART:  RRR.  PMI not displaced or sustained,S1 and S2 within normal limits, no S3, no S4, no clicks, no rubs, no murmurs ABD:  Flat, positive bowel sounds normal in frequency in pitch, no bruits, no rebound, no guarding, no midline pulsatile mass, no hepatomegaly, no splenomegaly EXT:  2 plus pulses throughout, no edema, no cyanosis no clubbing SKIN:  No rashes no nodules NEURO:  Cranial nerves II through XII grossly intact, motor grossly intact throughout PSYCH:  Cognitively intact, oriented to  person place and time    EKG:  EKG is not ordered today. The ekg ordered 12/02/15 demonstrates sinus rhythm. Rate 77 bpm.   Recent Labs: 09/21/2015: Platelets 272; TSH 1.12 12/02/2015: ALT 21; BUN 11; Creat 0.79; Hemoglobin 14.1; Potassium 3.7; Sodium 138    Lipid Panel    Component Value Date/Time   CHOL 193 08/24/2014 0842   TRIG 110 08/24/2014 0842   HDL 58 08/24/2014 0842   CHOLHDL 3.3 08/24/2014 0842   VLDL 22 08/24/2014 0842   LDLCALC 113 08/24/2014 0842      Wt Readings from Last 3 Encounters:  01/04/16 78.7 kg (173 lb 6.4 oz)  12/02/15 77.1 kg (170 lb)  11/22/15 74.8 kg (165 lb)      ASSESSMENT AND PLAN:  # Altered mental status: [name] episodes seem more consistent with seizures than syncope.  Her EEG didn't show any abnormalities.  However she did not have an event while wearing the EEG. We will obtain a 14 day event monitor to ensure that she does not have any arrhythmias significantly contributing to her symptoms. This seems less likely given that she is able to hear during the episodes. She does not have any exertional symptoms of ischemia testing is not indicated at this time.  Thyroid testing, CBC, and CMP were within normal limits 09/2015.  # Elevated blood pressure: [name]'s diastolic blood pressure is elevated today. In general her blood pressure has been well controlled. Given that she is having episodes of loss of consciousness and possibly syncope we will not add any antihypertensives at this time. She will continue to monitor this with her primary care physician.    Current medicines are reviewed at length with the patient today.  The patient does not have concerns regarding medicines.  The following changes have been made:  no change  Labs/ tests ordered today include:  No orders of the defined types were placed in this encounter.    Disposition:   FU with Rebekah Bolte C. Oval Linsey, MD, [facility] as needed.     This note was  written with the assistance of speech recognition software.  Please excuse any transcriptional errors.  Signed, Amiri Tritch C. Oval Linsey, MD, [facility]  01/04/2016 9:27 AM    [place]

## 2016-01-09 ENCOUNTER — Encounter: Payer: Self-pay | Admitting: Cardiovascular Disease

## 2016-01-09 DIAGNOSIS — R030 Elevated blood-pressure reading, without diagnosis of hypertension: Secondary | ICD-10-CM | POA: Insufficient documentation

## 2016-01-11 ENCOUNTER — Ambulatory Visit (INDEPENDENT_AMBULATORY_CARE_PROVIDER_SITE_OTHER): Payer: BLUE CROSS/BLUE SHIELD

## 2016-01-11 ENCOUNTER — Ambulatory Visit (AMBULATORY_SURGERY_CENTER): Payer: Self-pay | Admitting: *Deleted

## 2016-01-11 VITALS — Ht 60.0 in | Wt 170.0 lb

## 2016-01-11 DIAGNOSIS — Z1211 Encounter for screening for malignant neoplasm of colon: Secondary | ICD-10-CM

## 2016-01-11 DIAGNOSIS — R55 Syncope and collapse: Secondary | ICD-10-CM | POA: Diagnosis not present

## 2016-01-11 DIAGNOSIS — I10 Essential (primary) hypertension: Secondary | ICD-10-CM | POA: Insufficient documentation

## 2016-01-11 NOTE — Progress Notes (Signed)
Patient came in to [place] today, c/o having "dizzy" spells, states neurologist thinks she has seizures and getting heart monitor today for 14 days. Encouraged patient to tell her dr's about the dizzy spells. After speaking with patient we both feel like she should get cleared with all her dr's before her routine screening colonoscopy. She understands to speak with them about this colonoscopy. Reschedule her PV and colonoscopy for Feb. So all her office visit can be completed before this exam. Patient understands to call us to cancel at any time if her dr's feel like she should not have exam.

## 2016-01-18 ENCOUNTER — Ambulatory Visit (INDEPENDENT_AMBULATORY_CARE_PROVIDER_SITE_OTHER): Payer: BLUE CROSS/BLUE SHIELD | Admitting: Sports Medicine

## 2016-01-18 ENCOUNTER — Ambulatory Visit (INDEPENDENT_AMBULATORY_CARE_PROVIDER_SITE_OTHER): Payer: BLUE CROSS/BLUE SHIELD | Admitting: Family

## 2016-01-18 VITALS — Ht 60.0 in | Wt 170.0 lb

## 2016-01-18 DIAGNOSIS — G8929 Other chronic pain: Secondary | ICD-10-CM

## 2016-01-18 DIAGNOSIS — M542 Cervicalgia: Secondary | ICD-10-CM | POA: Diagnosis not present

## 2016-01-18 DIAGNOSIS — M797 Fibromyalgia: Secondary | ICD-10-CM

## 2016-01-18 MED ORDER — GABAPENTIN 300 MG PO CAPS: 300.0000 mg | ORAL_CAPSULE | Freq: Three times a day (TID) | ORAL | 2 refills | Status: DC
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
## 2016-01-23 ENCOUNTER — Other Ambulatory Visit: Payer: Self-pay | Admitting: Family Medicine

## 2016-01-23 ENCOUNTER — Encounter: Payer: Self-pay | Admitting: Neurology

## 2016-01-23 ENCOUNTER — Ambulatory Visit (INDEPENDENT_AMBULATORY_CARE_PROVIDER_SITE_OTHER): Payer: BLUE CROSS/BLUE SHIELD | Admitting: Neurology

## 2016-01-23 ENCOUNTER — Encounter: Payer: Self-pay | Admitting: Cardiovascular Disease

## 2016-01-23 VITALS — BP 142/90 | HR 80 | Ht 60.0 in | Wt 173.2 lb

## 2016-01-23 DIAGNOSIS — R404 Transient alteration of awareness: Secondary | ICD-10-CM | POA: Diagnosis not present

## 2016-01-23 NOTE — Progress Notes (Signed)
[facility]    Provider:  Dr Jaynee Eagles Referring Provider: No ref. provider found Primary Care Physician:  Reginia Forts, MD  CC:  Alteration of consciousness  Interval history 01/23/2016:   Rebekah [name] is a 61 y.o. female here as a referral from Dr. Tamala Julian for alteration of consciousness episodes for over 10 years. Past medical history depression, anxiety, fibromyalgia. She has episodes that started 10 years ago maybe earlier(per [facility] notes has been ongoing for 10 years and at [facility] Dxed with severe postural hypotension associated with vaso-vagal manifestations) . Episodes where she feels them coming on, her vision dims, her eyes close and she goes limp, she can hear people but can't respond, no urination or tongue biting. Can last up to 10 minutes with eyes closed.   MRI brain and MRA of the head, routine eeg and 72-hour eeg were all unremarkable, no etiology for her episodes was found. She is wearing a heart monitor currently. The last episode she had was in December and had one in in November. The three-day eeg was normal but she did not have any episodes. She has been wearing a heart monitor but no events. She has the episodes 1-2x a month. She denies any stress or strong emotions, spoke to her sister on the phone who says it happened in the car when sister was driving and patient was not responding, her eyes were closed, she wouldn't respond, she was sitting with eyes closed like she was asleep, no abnormal movements, she was tired afterwards but no confusion, the episode lasted 15 minutes, she would respond when her name was called she would lift her finger like she was saying "wait a minute", patient does not remember sister talking to her.     HPI:  Rebekah [name] is a 61 y.o. female here as a referral from Dr. Tamala Julian for alteration of consciousness. Past medical history depression, anxiety, fibromyalgia. She has episodes that started 10 years ago maybe  earlier(per [facility] notes has been ongoing for 10 years and at [facility] Dxed with severe postural hypotension associated with vaso-vagal manifestations)  but def worsened in 2014. She had 6 events on the way to the hospital when her daughter was pregnant. She was no admitted to [facility]. She was sent home. She gets a feeling coming up to her head and she "blacks out" and gets weak. She does not remember. She is here alone. She can hear everyone when it is happening but she can;t communicate she "zones out". She does not fall on the ground, has never fallen or hurt herself. No tongue biting or urination. She loses all strength. She remembers having the episode and she knows where she is when she "comes to". They last 2-5 minutes with no confusion afterwards. She feels them coming on, she can sit down, She feel lightheaded and feels her vision dimming. The episodes happen once a month. The last one was 25th of august and then on September 4th. She was driving the last one. Unknown triggers, not in the setting of stress, can be sitting or standing or laying down in bed. No other associated symptoms or modifying factors.No FHx of seizures. per husband she feels when it is coming, seemed liked she was goinmg to sleep, eyes closed during episodes, after a few seconds she becomes limp, up to 10 minutes, she wakes up and after a few seconds she knows what happened takes 30 minutes to regain strength. She can hear during episodes but can't tell what he says.  Reviewed notes, labs and imaging from outside physicians, which showed:  BUN 14, creatinine 0.8, TSH 1.27 September 2015.   Per [facility] notes 2014, reviewed notes:   The patient has a history of hypertension for several years, but no history of myocardial infarction, angina, heart failure, symptomatic rhythm disturbances or valvular heart disease. Accompanied by her daughter, who provides much of the history. The patient was "somewhat sketchy about details of her  remote history", but said frequent episodes of "passing out" over the course of 10 years or more.  Since 2011 experiencing intermittent episodes in which she became essentially unresponsive, associated with dimming out of her vision and a vague history of pressure from her diaphragm upward to her head. These episodes generally last from 2-5 minutes, and spontaneously dissipate. She says that she can usually hear other people talking during these times, but is unable to respond. No witnessed seizure activity or actual loss of consciousness. These episodes previously occurred only once or twice a month, and she said that she has never been evaluated for them. However, more recently had become considerably more frequent, and for that reason she presented for evaluation at the [facility] emergency department on 05/21/2012. At that time her diagnostic evaluation was unrevealing, with a normal electrocardiogram and otherwise unremarkable laboratory studies.The patient's daughter reported that her episodes associated with an apparently normal heart rhythm, and she had been able to measure her blood pressure during these spells.    1. Dizziness and unresponsiveness As demonstrated during her visit, the patient diagnosed with episodes of severe postural hypotension associated with vaso-vagal manifestations.It was recommended discontinuing her anti-hypertensive medication, and making a concerted effort to maintain a well-hydrated state.   Pertinent other findings: Supine blood pressure was recorded at 120/85. The patient was then asked to stand, and the blood pressure was recorded soon afterwards at [id number] systolic. After approximately a minute, the patient began to feel oddly and appeared less responsive. Her blood pressure was recorded at approximately 80 systolic. She was then assisted to a chair, but her blood pressure then became difficult to determine, and her heart rate was noted to be in the 40 range. She became less  responsive and reported nausea, and she was carefully assisted to the floor, and her feet elevated. Over the course of approximately 5 minutes she gradually improved, and returned to her baseline status, with systolic blood pressure in the 120 range.   CT head 2014(report only no images available): No hemorrhage, extra-axial fluid collection, cerebral edema, acute cortical infarction, mass, mass effect, midline shift. Ventricles and sulci are normal for patient age. Normal paranasal sinuses, orbits and cranium.  IMPRESSION: No acute intracranial abnormalities.  LP 2014 showed nml CSF    Review of Systems: Patient complains of symptoms per HPI as well as the following symptoms: fatigue, headache, joint pain, aching muscles, depression, anxiety. Pertinent negatives per HPI. All others negative.   Social History   Social History  . Marital status: Married    Spouse name: N/A  . Number of children: 4  . Years of education: 8   Occupational History  . [employer]    Social History Main Topics  . Smoking status: Never Smoker  . Smokeless tobacco: Never Used  . Alcohol use No  . Drug use: No  . Sexual activity: Not on file   Other Topics Concern  . Not on file   Social History Narrative   Lives with daughter, [name]   Caffeine use: coffee/tea/soda daily  Family History  Problem Relation Age of Onset  . Cancer Mother     breast   . Diabetes Brother   . Hypertension Brother   . Stroke Brother   . Cancer Maternal Grandmother     breast  . Colon cancer Neg Hx     Past Medical History:  Diagnosis Date  . Asthma   . Depression   . Elevated blood pressure reading 01/09/2016  . Fibromyalgia     Past Surgical History:  Procedure Laterality Date  . BREAST SURGERY Right    biopsy in the 1990's  . BUNIONECTOMY Bilateral    2000's - R twice and L once  . HAND SURGERY Bilateral    2000's  . TUBAL LIGATION  1984    Current Outpatient Prescriptions    Medication Sig Dispense Refill  . DULoxetine (CYMBALTA) 30 MG capsule Three tablets daily 90 capsule 3  . gabapentin (NEURONTIN) 300 MG capsule Take 1 capsule (300 mg total) by mouth 3 (three) times daily. 90 capsule 2  . Vitamin D, Ergocalciferol, (DRISDOL) 50000 units CAPS capsule TAKE 1 CAPSULE (50,000 UNITS TOTAL) BY MOUTH EVERY 7 (SEVEN) DAYS. 4 capsule 0   No current facility-administered medications for this visit.     Allergies as of 01/23/2016 - Review Complete 01/23/2016  Allergen Reaction Noted  . Zolpidem tartrate Other (See Comments) 08/27/2014  . Carisoprodol Other (See Comments) 08/27/2014    Vitals: BP (!) 142/90 (BP Location: Right Arm, Patient Position: Sitting, Cuff Size: Normal)   Pulse 80   Ht 5' (1.524 m)   Wt 173 lb 3.2 oz (78.6 kg)   SpO2 97%   BMI 33.83 kg/m  Last Weight:  Wt Readings from Last 1 Encounters:  01/23/16 173 lb 3.2 oz (78.6 kg)   Last Height:   Ht Readings from Last 1 Encounters:  01/23/16 5' (1.524 m)     Physical exam: Exam: Gen: NAD, conversant, well nourised, obese, well groomed                     CV: RRR, no MRG. No Carotid Bruits. No peripheral edema, warm, nontender Eyes: Conjunctivae clear without exudates or hemorrhage  Neuro: Detailed Neurologic Exam  Speech:    Speech is normal; fluent and spontaneous with normal comprehension.  Cognition:    The patient is oriented to person, place, and time;     recent and remote memory intact;     language fluent;     normal attention, concentration,     fund of knowledge Cranial Nerves:    The pupils are equal, round, and reactive to light. The fundi are normal and spontaneous venous pulsations are present. Visual fields are full to finger confrontation. Extraocular movements are intact. Trigeminal sensation is intact and the muscles of mastication are normal. The face is symmetric. The palate elevates in the midline. Hearing intact. Voice is normal. Shoulder shrug is normal.  The tongue has normal motion without fasciculations.   Coordination:    Normal finger to nose and heel to shin. Normal rapid alternating movements.   Gait:    Heel-toe and tandem gait are normal.   Motor Observation:    No asymmetry, no atrophy, and no involuntary movements noted. Tone:    Normal muscle tone.    Posture:    Posture is normal. normal erect    Strength:    Strength is V/V in the upper and lower limbs.      Sensation: intact to  LT     Reflex Exam:  DTR's:    Deep tendon reflexes in the upper and lower extremities are normal bilaterally.   Toes:    The toes are downgoing bilaterally.   Clonus:    Clonus is absent.     Assessment/Plan:    Rebekah [name] is a 61 y.o. female here as a referral from Dr. Tamala Julian for alteration of consciousness episodes for over 10 years. Past medical history depression, anxiety, fibromyalgia. She has episodes that started 10 years ago maybe earlier(per [facility] notes has been ongoing for 10 years and at [facility] Dxed with severe postural hypotension associated with vaso-vagal manifestations) . Episodes where she feels them coming on, her vision dims, her eyes close and she goes limp, she can hear people but can;t respond, no urination or tongue biting. Can last up to 10-15 minutes with eyes closed. Does not appear to be seizure events, may be vasovagal as per [facility] or non-epileptic events.   - Patient is unable to drive, operate heavy machinery, perform activities at heights or participate in water activities until 6 months event free, discussed this with her again today. - If possible when patient is having an event I would like however with her to videotape on her phone so that I can review it.  - Imaging, routine EEG and 72 hour EEG were all unremarkable however patient did not have an episode during EEGs. Feel seizures are less likely given history and normal workup. Still given that there is no etiology if cardiac workup is  negative we can trial some low-dose keppra.  - Patient is currently being evaluated by cardiology with heart monitor. If no event is captured I recommend a loop recorder that can monitor for upwards of 2 years.   Cc: Wardell Honour, MD  Sarina Ill, MD  [facility] [address] [place], [place] [address]  Phone [phone] Fax [phone]  A total of 30 minutes was spent face-to-face with this patient. Over half this time was spent on counseling patient on the alteration of consciousness diagnosis and different diagnostic and therapeutic options available.

## 2016-01-23 NOTE — Patient Instructions (Addendum)
Remember to drink plenty of fluid, eat healthy meals and do not skip any meals. Try to eat protein with a every meal and eat a healthy snack such as fruit or nuts in between meals. Try to keep a regular sleep-wake schedule and try to exercise daily, particularly in the form of walking, 20-30 minutes a day, if you can.   As far as your medications are concerned, I would like to suggest  Levetiracetam tablets What is this medicine? LEVETIRACETAM (lee ve tye RA se tam) is an antiepileptic drug. It is used with other medicines to treat certain types of seizures. This medicine may be used for other purposes; ask your health care provider or pharmacist if you have questions. COMMON BRAND NAME(S): Keppra, Roweepra What should I tell my health care provider before I take this medicine? They need to know if you have any of these conditions: -kidney disease -suicidal thoughts, plans, or attempt; a previous suicide attempt by you or a family member -an unusual or allergic reaction to levetiracetam, other medicines, foods, dyes, or preservatives -pregnant or trying to get pregnant -breast-feeding How should I use this medicine? Take this medicine by mouth with a glass of water. Follow the directions on the prescription label. Swallow the tablets whole. Do not crush or chew this medicine. You may take this medicine with or without food. Take your doses at regular intervals. Do not take your medicine more often than directed. Do not stop taking this medicine or any of your seizure medicines unless instructed by your doctor or health care professional. Stopping your medicine suddenly can increase your seizures or their severity. A special MedGuide will be given to you by the pharmacist with each prescription and refill. Be sure to read this information carefully each time. Contact your pediatrician or health care professional regarding the use of this medication in children. While this drug may be prescribed for  children as young as 53 years of age for selected conditions, precautions do apply. Overdosage: If you think you have taken too much of this medicine contact a poison control center or emergency room at once. NOTE: This medicine is only for you. Do not share this medicine with others. What if I miss a dose? If you miss a dose, take it as soon as you can. If it is almost time for your next dose, take only that dose. Do not take double or extra doses. What may interact with this medicine? This medicine may interact with the following medications: -carbamazepine -colesevelam -probenecid -sevelamer This list may not describe all possible interactions. Give your health care provider a list of all the medicines, herbs, non-prescription drugs, or dietary supplements you use. Also tell them if you smoke, drink alcohol, or use illegal drugs. Some items may interact with your medicine. What should I watch for while using this medicine? Visit your doctor or health care professional for a regular check on your progress. Wear a medical identification bracelet or chain to say you have epilepsy, and carry a card that lists all your medications. It is important to take this medicine exactly as instructed by your health care professional. When first starting treatment, your dose may need to be adjusted. It may take weeks or months before your dose is stable. You should contact your doctor or health care professional if your seizures get worse or if you have any new types of seizures. You may get drowsy or dizzy. Do not drive, use machinery, or do anything that needs  mental alertness until you know how this medicine affects you. Do not stand or sit up quickly, especially if you are an older patient. This reduces the risk of dizzy or fainting spells. Alcohol may interfere with the effect of this medicine. Avoid alcoholic drinks. The use of this medicine may increase the chance of suicidal thoughts or actions. Pay  special attention to how you are responding while on this medicine. Any worsening of mood, or thoughts of suicide or dying should be reported to your health care professional right away. Women who become pregnant while using this medicine may enroll in the [place] Pregnancy Registry by calling [phone]. This registry collects information about the safety of antiepileptic drug use during pregnancy. What side effects may I notice from receiving this medicine? Side effects that you should report to your doctor or health care professional as soon as possible: -allergic reactions like skin rash, itching or hives, swelling of the face, lips, or tongue -breathing problems -dark urine -general ill feeling or flu-like symptoms -problems with balance, talking, walking -unusually weak or tired -worsening of mood, thoughts or actions of suicide or dying -yellowing of the eyes or skin Side effects that usually do not require medical attention (report to your doctor or health care professional if they continue or are bothersome): -diarrhea -dizzy, drowsy -headache -loss of appetite This list may not describe all possible side effects. Call your doctor for medical advice about side effects. You may report side effects to FDA at 1-800-FDA-1088. Where should I keep my medicine? Keep out of reach of children. Store at room temperature between 15 and 30 degrees C (59 and 86 degrees F). Throw away any unused medicine after the expiration date. NOTE: This sheet is a summary. It may not cover all possible information. If you have questions about this medicine, talk to your doctor, pharmacist, or health care provider.  2017 Elsevier/Gold Standard (2015-02-03 09:43:54)  Our phone number is [phone]. We also have an after hours call service for urgent matters and there is a physician on-call for urgent questions. For any emergencies you know to call 911 or go to the nearest  emergency room

## 2016-01-24 ENCOUNTER — Encounter: Payer: Self-pay | Admitting: Family Medicine

## 2016-01-24 NOTE — Telephone Encounter (Signed)
Rebekah [name] is calling because her Neurologist suggested she wear the heart monitor for 2 additional weeks, she is calling to verify if that would be okay. Please call, thanks.

## 2016-01-25 ENCOUNTER — Ambulatory Visit (INDEPENDENT_AMBULATORY_CARE_PROVIDER_SITE_OTHER): Payer: BLUE CROSS/BLUE SHIELD | Admitting: Family Medicine

## 2016-01-25 ENCOUNTER — Encounter: Payer: No Typology Code available for payment source | Admitting: Gastroenterology

## 2016-01-25 ENCOUNTER — Encounter: Payer: Self-pay | Admitting: Family Medicine

## 2016-01-25 ENCOUNTER — Telehealth: Payer: Self-pay | Admitting: Cardiovascular Disease

## 2016-01-25 ENCOUNTER — Telehealth: Payer: Self-pay | Admitting: *Deleted

## 2016-01-25 VITALS — BP 138/87 | HR 89 | Temp 98.4°F | Resp 16 | Ht 60.0 in | Wt 173.0 lb

## 2016-01-25 DIAGNOSIS — G8929 Other chronic pain: Secondary | ICD-10-CM

## 2016-01-25 DIAGNOSIS — M7732 Calcaneal spur, left foot: Secondary | ICD-10-CM

## 2016-01-25 DIAGNOSIS — M25541 Pain in joints of right hand: Secondary | ICD-10-CM

## 2016-01-25 DIAGNOSIS — M25542 Pain in joints of left hand: Secondary | ICD-10-CM

## 2016-01-25 DIAGNOSIS — R404 Transient alteration of awareness: Secondary | ICD-10-CM

## 2016-01-25 DIAGNOSIS — Z9889 Other specified postprocedural states: Secondary | ICD-10-CM

## 2016-01-25 DIAGNOSIS — F418 Other specified anxiety disorders: Secondary | ICD-10-CM | POA: Diagnosis not present

## 2016-01-25 DIAGNOSIS — M542 Cervicalgia: Secondary | ICD-10-CM

## 2016-01-25 MED ORDER — MELOXICAM 7.5 MG PO TABS: 7.5000 mg | ORAL_TABLET | Freq: Every day | ORAL | 2 refills | Status: DC | PRN

## 2016-01-25 NOTE — Telephone Encounter (Signed)
Spoke with patient and she stated Neurologist recommended 2 additional weeks for monitor Advised patient would call to get the monitor extended  Spoke with [facility] and she will call to see if it can be extended, today is supposed to be end of service day

## 2016-01-25 NOTE — Progress Notes (Signed)
Subjective:    Patient ID: Rebekah [name], female    DOB: 1955/04/24, 61 y.o.   MRN: [id number]  01/25/2016  Hand Pain (for a while, left hand worse and radiates up arm ) and Foot Pain (left heel for a while )   HPI This 61 y.o. female presents for two month follow-up for arthralgias.  Hands are hurting in palms and radiates into forearms.  Wearing hand brace at night.  Has worsened since last visit.  Watches television all day; alone .   Was going to iron today but decided not to.  Hands do swell at times.  Hands swelling in PIP joints.  So tender B hands.  No n/t/b in arms in forearms. Feels like forearms are going to twitch.  Vitamin D not taking; not taking rx for Vitamin D.  Purchased vitamin D rx but has not been taking.  Referred to ortho on 10/27/15.  Referred to rheumatology on 10/2014 by [place].   S/p ortho consultation in 10/2015; presribed Neurontin; s/p MRI neck angio.  Taking Cymbalta   Dr. Nino Parsley a specialist in [place].  Has undergone CTS twice in R hand and once in L hand.  Will get shooting pain in B arms.  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  Altered mental status: wearing a holter monitor. Cannot drive per neurology.    L heel spur: painful.  Dx with heel spur; painful.  No heel cups.     Immunization History  Administered Date(s) Administered  . Influenza,inj,Quad PF,36+ Mos 11/10/2014, 10/27/2015   BP Readings from Last 3 Encounters:  01/25/16 138/87  01/23/16 (!) 142/90  01/04/16 (!) 134/97   Wt Readings from Last 3 Encounters:  01/25/16 173 lb (78.5 kg)  01/23/16 173 lb 3.2 oz (78.6 kg)  01/18/16 170 lb (77.1 kg)    Review of Systems  Constitutional: Negative for chills, diaphoresis, fatigue and fever.  Eyes: Negative for visual disturbance.  Respiratory: Negative for cough and shortness of breath.   Cardiovascular: Negative for chest pain, palpitations and leg swelling.  Gastrointestinal: Negative for abdominal pain, constipation,  diarrhea, nausea and vomiting.  Endocrine: Negative for cold intolerance, heat intolerance, polydipsia, polyphagia and polyuria.  Musculoskeletal: Positive for arthralgias, neck pain and neck stiffness.  Neurological: Negative for dizziness, tremors, seizures, syncope, facial asymmetry, speech difficulty, weakness, light-headedness, numbness and headaches.    Past Medical History:  Diagnosis Date  . Asthma   . Depression   . Elevated blood pressure reading 01/09/2016  . Fibromyalgia    Past Surgical History:  Procedure Laterality Date  . BREAST SURGERY Right    biopsy in the 1990's  . BUNIONECTOMY Bilateral    2000's - R twice and L once  . HAND SURGERY Bilateral    2000's  . TUBAL LIGATION  1984   Allergies  Allergen Reactions  . Zolpidem Tartrate Other (See Comments)    Other reaction(s): Hallucination  . Carisoprodol Other (See Comments)    chills    Social History   Social History  . Marital status: Married    Spouse name: N/A  . Number of children: 4  . Years of education: 8   Occupational History  . [employer]    Social History Main Topics  . Smoking status: Never Smoker  . Smokeless tobacco: Never Used  . Alcohol use No  . Drug use: No  . Sexual activity: Not on file   Other Topics Concern  . Not on file   Social History  Narrative   Lives with daughter, [name]   Caffeine use: coffee/tea/soda daily   Family History  Problem Relation Age of Onset  . Cancer Mother     breast   . Diabetes Brother   . Hypertension Brother   . Stroke Brother   . Cancer Maternal Grandmother     breast  . Colon cancer Neg Hx        Objective:    BP 138/87 (BP Location: Right Arm, Patient Position: Sitting, Cuff Size: Small)   Pulse 89   Temp 98.4 F (36.9 C) (Oral)   Resp 16   Ht 5' (1.524 m)   Wt 173 lb (78.5 kg)   SpO2 96%   BMI 33.79 kg/m  Physical Exam  Constitutional: She is oriented to person, place, and time. She appears well-developed  and well-nourished. No distress.  HENT:  Head: Normocephalic and atraumatic.  Eyes: Conjunctivae are normal. Pupils are equal, round, and reactive to light.  Neck: Normal range of motion. Neck supple.  Cardiovascular: Normal rate, regular rhythm and normal heart sounds.  Exam reveals no gallop and no friction rub.   No murmur heard. Pulmonary/Chest: Effort normal and breath sounds normal. She has no wheezes. She has no rales.  Musculoskeletal:       Right wrist: Normal. She exhibits normal range of motion, no tenderness and no bony tenderness.       Left wrist: Normal. She exhibits normal range of motion, no tenderness, no bony tenderness and no swelling.       Cervical back: She exhibits pain. She exhibits normal range of motion, no tenderness, no bony tenderness, no spasm and normal pulse.       Right forearm: Normal. She exhibits no tenderness, no bony tenderness and no swelling.       Left forearm: Normal. She exhibits no tenderness, no bony tenderness and no swelling.       Right hand: Normal. She exhibits normal range of motion, no tenderness and no bony tenderness. Normal strength noted.       Left hand: Normal. She exhibits normal range of motion, no tenderness and no bony tenderness. Normal strength noted.  Neurological: She is alert and oriented to person, place, and time.  Skin: She is not diaphoretic.  Psychiatric: She has a normal mood and affect. Her behavior is normal.  Nursing note and vitals reviewed.  Results for orders placed or performed in visit on 12/02/15  Comprehensive metabolic panel  Result Value Ref Range   Sodium 138 135 - 146 mmol/L   Potassium 3.7 3.5 - 5.3 mmol/L   Chloride 102 98 - 110 mmol/L   CO2 30 20 - 31 mmol/L   Glucose, Bld 88 65 - 99 mg/dL   BUN 11 7 - 25 mg/dL   Creat 0.79 0.50 - 1.05 mg/dL   Total Bilirubin 0.4 0.2 - 1.2 mg/dL   Alkaline Phosphatase 80 33 - 130 U/L   AST 20 10 - 35 U/L   ALT 21 6 - 29 U/L   Total Protein 6.5 6.1 - 8.1 g/dL     Albumin 4.1 3.6 - 5.1 g/dL   Calcium 9.0 8.6 - 10.4 mg/dL  POCT glucose (manual entry)  Result Value Ref Range   POC Glucose 83 70 - 99 mg/dl  POCT CBC  Result Value Ref Range   WBC 7.2 4.6 - 10.2 K/uL   Lymph, poc 2.5 0.6 - 3.4   POC LYMPH PERCENT 34.6 10 - 50 %  L   MID (cbc) 0.5 0 - 0.9   POC MID % 7.1 0 - 12 %M   POC Granulocyte 4.2 2 - 6.9   Granulocyte percent 58.3 37 - 80 %G   RBC 4.32 4.04 - 5.48 M/uL   Hemoglobin 14.1 12.2 - 16.2 g/dL   HCT, POC 40.8 37.7 - 47.9 %   MCV 94.5 80 - 97 fL   MCH, POC 32.6 (A) 27 - 31.2 pg   MCHC 34.5 31.8 - 35.4 g/dL   RDW, POC 13.5 %   Platelet Count, POC 225 142 - 424 K/uL   MPV 8.5 0 - 99.8 fL  POCT urinalysis dipstick  Result Value Ref Range   Color, UA yellow yellow   Clarity, UA clear clear   Glucose, UA negative negative   Bilirubin, UA negative negative   Ketones, POC UA negative negative   Spec Grav, UA 1.020    Blood, UA small (A) negative   pH, UA 6.5    Protein Ur, POC negative negative   Urobilinogen, UA 1.0    Nitrite, UA Negative Negative   Leukocytes, UA small (1+) (A) Negative       Assessment & Plan:   1. Arthralgia of both hands   2. Calcaneal spur of left foot   3. Anxiety associated with depression   4. History of decompression of median nerve   5. Alteration consciousness   6. Chronic neck pain    -persistent ongoing arthralgias; pt desires referral to rheumatology for consultation; rx for Meloxicam provided to take daily for both arthralgias of B hands and L heel spur. -s/p ortho consultation for chronic neck pain.  MRI cervical spine has been ordered for patient. -home exercise program provided for heel spur.   Orders Placed This Encounter  Procedures  . Ambulatory referral to Rheumatology    Referral Priority:   Routine    Referral Type:   Consultation    Referral Reason:   Specialty Services Required    Requested Specialty:   Rheumatology    Number of Visits Requested:   1   Meds ordered  this encounter  Medications  . DISCONTD: meloxicam (MOBIC) 7.5 MG tablet    Sig: Take 1 tablet (7.5 mg total) by mouth daily as needed for pain.    Dispense:  30 tablet    Refill:  2    No Follow-up on file.   Colsen Modi Elayne Guerin, M.D. [facility] [address] [place], [place]  [address] [phone] phone [phone] fax

## 2016-01-25 NOTE — Patient Instructions (Addendum)
   IF you received an x-ray today, you will receive an invoice from [facility]. Please contact [facility] at [phone] with questions or concerns regarding your invoice.   IF you received labwork today, you will receive an invoice from [facility]. Please contact [facility] at [phone] with questions or concerns regarding your invoice.   Our billing staff will not be able to assist you with questions regarding bills from these companies.  You will be contacted with the lab results as soon as they are available. The fastest way to get your results is to activate your My Chart account. Instructions are located on the last page of this paperwork. If you have not heard from us regarding the results in 2 weeks, please contact this office.      Plantar Fasciitis Rehab Ask your health care provider which exercises are safe for you. Do exercises exactly as told by your health care provider and adjust them as directed. It is normal to feel mild stretching, pulling, tightness, or discomfort as you do these exercises, but you should stop right away if you feel sudden pain or your pain gets worse. Do not begin these exercises until told by your health care provider. Stretching and range of motion exercises These exercises warm up your muscles and joints and improve the movement and flexibility of your foot. These exercises also help to relieve pain. Exercise A: Plantar fascia stretch   1. Sit with your left / right leg crossed over your opposite knee. 2. Hold your heel with one hand with that thumb near your arch. With your other hand, hold your toes and gently pull them back toward the top of your foot. You should feel a stretch on the bottom of your toes or your foot or both. 3. Hold this stretch for__________ seconds. 4. Slowly release your toes and return to the starting position. Repeat __________ times. Complete this exercise __________ times a day. Exercise B: Gastroc,  standing   1. Stand with your hands against a wall. 2. Extend your left / right leg behind you, and bend your front knee slightly. 3. Keeping your heels on the floor and keeping your back knee straight, shift your weight toward the wall without arching your back. You should feel a gentle stretch in your left / right calf. 4. Hold this position for __________ seconds. Repeat __________ times. Complete this exercise __________ times a day. Exercise C: Soleus, standing  1. Stand with your hands against a wall. 2. Extend your left / right leg behind you, and bend your front knee slightly. 3. Keeping your heels on the floor, bend your back knee and slightly shift your weight over the back leg. You should feel a gentle stretch deep in your calf. 4. Hold this position for __________ seconds. Repeat __________ times. Complete this exercise __________ times a day. Exercise D: Gastrocsoleus, standing  1. Stand with the ball of your left / right foot on a step. The ball of your foot is on the walking surface, right under your toes. 2. Keep your other foot firmly on the same step. 3. Hold onto the wall or a railing for balance. 4. Slowly lift your other foot, allowing your body weight to press your heel down over the edge of the step. You should feel a stretch in your left / right calf. 5. Hold this position for __________ seconds. 6. Return both feet to the step. 7. Repeat this exercise with a slight bend in your left /   right knee. Repeat __________ times with your left / right knee straight and __________ times with your left / right knee bent. Complete this exercise __________ times a day. Balance exercise This exercise builds your balance and strength control of your arch to help take pressure off your plantar fascia. Exercise E: Single leg stand  1. Without shoes, stand near a railing or in a doorway. You may hold onto the railing or door frame as needed. 2. Stand on your left / right foot. Keep  your big toe down on the floor and try to keep your arch lifted. Do not let your foot roll inward. 3. Hold this position for __________ seconds. 4. If this exercise is too easy, you can try it with your eyes closed or while standing on a pillow. Repeat __________ times. Complete this exercise __________ times a day. This information is not intended to replace advice given to you by your health care provider. Make sure you discuss any questions you have with your health care provider. Document Released: 01/01/2005 Document Revised: 09/06/2015 Document Reviewed: 11/15/2014 Elsevier Interactive Patient Education  2017 Elsevier Inc.  

## 2016-01-25 NOTE — Telephone Encounter (Signed)
Rebekah [name] at 01/25/2016 1:10 PM   Status: Signed    Evelena Asa from [employer] was contacted to extend monitor to 30 day cardiac event monitor ending 02/08/16.  Patient will need to send the monitor back to [employer] at that date.  If she needs additional supplies, she will need to contact [employer] directly. LMVM

## 2016-01-25 NOTE — Telephone Encounter (Signed)
New Message  Ptis calling because her Neurologist suggested she wear the heart monitor for 2 additional weeks, she is calling to verify if that would be okay. Would like a call back.

## 2016-01-25 NOTE — Telephone Encounter (Signed)
Rebekah [name] from [facility] was contacted to extend monitor to 30 day cardiac event monitor ending 02/08/16.  Patient will need to send the monitor back to [employer] at that date.  If she needs additional supplies, she will need to contact [employer] directly. LMVM

## 2016-01-27 ENCOUNTER — Encounter (INDEPENDENT_AMBULATORY_CARE_PROVIDER_SITE_OTHER): Payer: Self-pay | Admitting: *Deleted

## 2016-01-28 ENCOUNTER — Other Ambulatory Visit (INDEPENDENT_AMBULATORY_CARE_PROVIDER_SITE_OTHER): Payer: Self-pay | Admitting: Sports Medicine

## 2016-01-28 ENCOUNTER — Other Ambulatory Visit: Payer: Self-pay | Admitting: Family Medicine

## 2016-01-28 DIAGNOSIS — F330 Major depressive disorder, recurrent, mild: Secondary | ICD-10-CM

## 2016-01-28 DIAGNOSIS — M797 Fibromyalgia: Secondary | ICD-10-CM

## 2016-01-29 NOTE — Telephone Encounter (Signed)
Last ov 01/23/16 Last refill 09/2015

## 2016-01-30 ENCOUNTER — Telehealth: Payer: Self-pay | Admitting: Cardiovascular Disease

## 2016-01-30 NOTE — Telephone Encounter (Signed)
Follow up  ° ° °Pt returning call  °

## 2016-01-30 NOTE — Telephone Encounter (Signed)
Late entry - Was unable to get fax today - called approx 4pm for resend and have yet to receive. Will f/u tomorrow.

## 2016-01-30 NOTE — Telephone Encounter (Signed)
Have spoken w [employer] rep who informs me event strip will be sent to office.

## 2016-01-30 NOTE — Telephone Encounter (Signed)
Spoke to patient, who states her episode occurred around 9:30am-9:45am  Asked her about symptoms: "I totally don't remember". Notes she could hear but not respond to anyone  Patient put me on the phone with her daughter:  Daughter states she wasn't there when it happened. She came back after it happened. Adds that the patient was responsive, "but not very alert. She would kind of fall asleep but not really fall asleep."   She informs me she did press a symptom button that morning. Advised patient I would contact [employer], see if any arrythmia noted on monitor when this occurred. Would have this reviewed by [employer] for any concerns, contact patient w advice.  Of note, she does not currently have a f/u visit scheduled after her monitor - she gets this off on 24th. Will seek advice on this based on today's call.

## 2016-01-30 NOTE — Telephone Encounter (Signed)
Left msg to call.

## 2016-01-30 NOTE — Telephone Encounter (Signed)
Pt had a "seizure" like episode yesterday, 01-29-16 at church. Wants to know if she needs to bring the monitor in to have it checked-pls call

## 2016-01-30 NOTE — Telephone Encounter (Signed)
Evaluated in office on 01/26/16.

## 2016-01-31 ENCOUNTER — Ambulatory Visit (HOSPITAL_COMMUNITY): Admission: RE | Admit: 2016-01-31 | Payer: BLUE CROSS/BLUE SHIELD | Source: Ambulatory Visit

## 2016-01-31 NOTE — Telephone Encounter (Signed)
Rx refill request

## 2016-02-01 ENCOUNTER — Ambulatory Visit: Payer: Self-pay

## 2016-02-06 ENCOUNTER — Telehealth: Payer: Self-pay

## 2016-02-06 NOTE — Telephone Encounter (Signed)
Please place referral if appropriate.

## 2016-02-06 NOTE — Telephone Encounter (Signed)
Pt would like a referral for a routine eye exam. Last visit 1/10 with Dr Tamala Julian.

## 2016-02-08 NOTE — Telephone Encounter (Signed)
Have not been able to get this strip from [employer]. I've left patient msg to call if she has a repeat episode. Unsure if further information needed. Will route to provider as fyi.

## 2016-02-08 NOTE — Telephone Encounter (Signed)
Call --- insurance usually does not require referral for regular eye exam.  Is her insurance requiring a referral?  I recommend she call [facility] for an eye exam.

## 2016-02-08 NOTE — Telephone Encounter (Signed)
Does insurance need a referral?

## 2016-02-09 NOTE — Telephone Encounter (Signed)
Her current [employer] plan does not require referrals. As far as I can tell, this is still her active policy. If she has switched to another carrier [facility]), it may require one but we would need her new ins card.

## 2016-02-09 NOTE — Telephone Encounter (Signed)
Pt advised she has no money to see eye md now. Wants [place] to check on rheumatology referral, they havent called yet.

## 2016-02-09 NOTE — Telephone Encounter (Signed)
Referral hasn't been sent yet // cannot be sent to Rheum until OV notes are finished from 1/10 visit. Thank you for calling her about the eye referral, Santiago Glad!

## 2016-02-10 ENCOUNTER — Encounter: Payer: Self-pay | Admitting: Family Medicine

## 2016-02-11 ENCOUNTER — Ambulatory Visit: Payer: BLUE CROSS/BLUE SHIELD

## 2016-02-12 ENCOUNTER — Encounter: Payer: Self-pay | Admitting: Neurology

## 2016-02-12 MED ORDER — MELOXICAM 7.5 MG PO TABS: 7.5000 mg | ORAL_TABLET | Freq: Every day | ORAL | 2 refills | Status: DC | PRN

## 2016-02-13 ENCOUNTER — Encounter: Payer: Self-pay | Admitting: Neurology

## 2016-02-13 ENCOUNTER — Other Ambulatory Visit: Payer: Self-pay | Admitting: Neurology

## 2016-02-13 MED ORDER — LEVETIRACETAM 500 MG PO TABS: 500.0000 mg | ORAL_TABLET | Freq: Two times a day (BID) | ORAL | 11 refills | Status: DC

## 2016-02-13 NOTE — Telephone Encounter (Signed)
Documentation completed; please proceed.

## 2016-02-20 ENCOUNTER — Ambulatory Visit
Admission: RE | Admit: 2016-02-20 | Discharge: 2016-02-20 | Disposition: A | Discharge status: Home / Self Care | Payer: BLUE CROSS/BLUE SHIELD | Source: Ambulatory Visit | Attending: Family Medicine | Admitting: Family Medicine

## 2016-02-20 ENCOUNTER — Encounter: Payer: Self-pay | Admitting: Neurology

## 2016-02-20 DIAGNOSIS — Z1231 Encounter for screening mammogram for malignant neoplasm of breast: Secondary | ICD-10-CM

## 2016-02-22 ENCOUNTER — Encounter: Payer: Self-pay | Admitting: Family Medicine

## 2016-02-22 DIAGNOSIS — M791 Myalgia: Secondary | ICD-10-CM | POA: Diagnosis not present

## 2016-02-22 DIAGNOSIS — M2550 Pain in unspecified joint: Secondary | ICD-10-CM | POA: Diagnosis not present

## 2016-02-22 NOTE — Telephone Encounter (Signed)
Please put hand radiographs from 09/21/2015 on a disc for patient to pick up.  Warmly, Helane Briceno

## 2016-02-27 ENCOUNTER — Telehealth: Payer: Self-pay | Admitting: *Deleted

## 2016-02-27 NOTE — Telephone Encounter (Signed)
Spoke with patient. She states she is having seizures and that she is taking the seizure medications Keppra. She also states that she was cleared for colonoscopy by her neurologist. I still would like you to review this. Thank you. Robbin

## 2016-02-27 NOTE — Telephone Encounter (Signed)
Dr.Armbruster,  This patient came in [facility] on 01-11-16 for direct screening colonoscopy. During this she told me about having "seizures and getting heart monitor". I then rescheduled her PV and colon for later date so she could get cleared from neurology. When looking in EPIC I do see she has been to cardiology and neurology but also looks like she is having episodes of seizures per patient on 02/13/16 notes. Unsure if she has been cleared. Please review chart. Is patient okay for direct screening colonoscopy at [facility] at this time or OV first?  Thank you! Oscar Hank,PV

## 2016-02-28 NOTE — Telephone Encounter (Signed)
Hi Robbin, I think we will need to directly reach out to Neurology and ask them if she is cleared. Per their last clinic note they felt seizures were unlikely, although did recommend follow up testing which I can't see. If they think she is cleared for a screening colonoscopy, and if okay with our anesthesia dept, then we can book. I may be best that I see her in clinic first to sort it out. Thanks

## 2016-02-29 ENCOUNTER — Other Ambulatory Visit: Payer: Self-pay | Admitting: Family Medicine

## 2016-02-29 ENCOUNTER — Encounter: Payer: Self-pay | Admitting: *Deleted

## 2016-02-29 NOTE — Telephone Encounter (Signed)
Phoned patient and notified that Dr. Havery Moros would like to see her in the office. Patient understanding and in agreement. Colonoscopy cancelled and appointment made for OV 04/10/16.

## 2016-02-29 NOTE — Telephone Encounter (Signed)
Error

## 2016-03-02 ENCOUNTER — Other Ambulatory Visit: Payer: Self-pay | Admitting: Family Medicine

## 2016-03-02 DIAGNOSIS — F330 Major depressive disorder, recurrent, mild: Secondary | ICD-10-CM

## 2016-03-02 DIAGNOSIS — M797 Fibromyalgia: Secondary | ICD-10-CM

## 2016-03-02 NOTE — Telephone Encounter (Signed)
01/2016 

## 2016-03-03 DIAGNOSIS — H40033 Anatomical narrow angle, bilateral: Secondary | ICD-10-CM | POA: Diagnosis not present

## 2016-03-03 DIAGNOSIS — H2513 Age-related nuclear cataract, bilateral: Secondary | ICD-10-CM | POA: Diagnosis not present

## 2016-03-06 ENCOUNTER — Encounter: Payer: Self-pay | Admitting: Family Medicine

## 2016-03-06 ENCOUNTER — Encounter: Payer: Self-pay | Admitting: Neurology

## 2016-03-07 DIAGNOSIS — F4321 Adjustment disorder with depressed mood: Secondary | ICD-10-CM | POA: Diagnosis not present

## 2016-03-07 DIAGNOSIS — M2550 Pain in unspecified joint: Secondary | ICD-10-CM | POA: Diagnosis not present

## 2016-03-07 DIAGNOSIS — M791 Myalgia: Secondary | ICD-10-CM | POA: Diagnosis not present

## 2016-03-07 NOTE — Telephone Encounter (Signed)
Pt was placed on gabapentin in Nov by ortho d/t neck pain, symptoms of fibromyalgia and seizure-like/syncopal episodes w/ alteration of consciousness, chest pain and headache. 3 day EEG was negative. MRI/MRA unremarkable except partial empty sella turcica. EKG normal. Pt is also on Cymbalta and Keppra.

## 2016-03-12 DIAGNOSIS — F4321 Adjustment disorder with depressed mood: Secondary | ICD-10-CM | POA: Diagnosis not present

## 2016-03-14 ENCOUNTER — Encounter: Payer: Self-pay | Admitting: Neurology

## 2016-03-14 ENCOUNTER — Encounter: Payer: BLUE CROSS/BLUE SHIELD | Admitting: Gastroenterology

## 2016-03-19 DIAGNOSIS — F4321 Adjustment disorder with depressed mood: Secondary | ICD-10-CM | POA: Diagnosis not present

## 2016-03-21 ENCOUNTER — Encounter: Payer: Self-pay | Admitting: Neurology

## 2016-03-22 ENCOUNTER — Telehealth: Payer: Self-pay | Admitting: Neurology

## 2016-03-22 ENCOUNTER — Encounter: Payer: Self-pay | Admitting: Neurology

## 2016-03-22 ENCOUNTER — Other Ambulatory Visit: Payer: Self-pay | Admitting: Neurology

## 2016-03-22 MED ORDER — LEVETIRACETAM 750 MG PO TABS: 750.0000 mg | ORAL_TABLET | Freq: Two times a day (BID) | ORAL | 12 refills | Status: DC

## 2016-03-22 NOTE — Telephone Encounter (Signed)
Returned call to patient. Says that she has not had any additional seizure activity since yesterday but does still feel extremely tired. Reviewed recommendations to increase Keppra dose to 750 mg, taking twice a day. Encouraged rest and fluids and to call EMS for any prolonged seizures. Verbalized understanding and appreciation for cal.

## 2016-03-22 NOTE — Telephone Encounter (Signed)
Patient called and said she had a "seizure". I am going to call in an increase of her keppra. Please let her know and see if she is ok. Also if she has a seizure that lasts 45 minutes they should be calling EMS.

## 2016-03-29 ENCOUNTER — Encounter: Payer: Self-pay | Admitting: Neurology

## 2016-03-29 DIAGNOSIS — F4321 Adjustment disorder with depressed mood: Secondary | ICD-10-CM | POA: Diagnosis not present

## 2016-03-29 NOTE — Telephone Encounter (Signed)
Pt's Keppra was increased to 750 mg last week d/t her reports of having a seizure.

## 2016-04-03 ENCOUNTER — Encounter: Payer: Self-pay | Admitting: Neurology

## 2016-04-06 ENCOUNTER — Encounter: Payer: Self-pay | Admitting: Neurology

## 2016-04-06 DIAGNOSIS — F4321 Adjustment disorder with depressed mood: Secondary | ICD-10-CM | POA: Diagnosis not present

## 2016-04-07 ENCOUNTER — Encounter: Payer: Self-pay | Admitting: Family Medicine

## 2016-04-10 ENCOUNTER — Ambulatory Visit (INDEPENDENT_AMBULATORY_CARE_PROVIDER_SITE_OTHER): Payer: BLUE CROSS/BLUE SHIELD | Admitting: Gastroenterology

## 2016-04-10 ENCOUNTER — Encounter: Payer: Self-pay | Admitting: Gastroenterology

## 2016-04-10 VITALS — BP 124/86 | HR 88 | Ht 59.5 in | Wt 170.4 lb

## 2016-04-10 DIAGNOSIS — Z800 Family history of malignant neoplasm of digestive organs: Secondary | ICD-10-CM

## 2016-04-10 DIAGNOSIS — Z1211 Encounter for screening for malignant neoplasm of colon: Secondary | ICD-10-CM | POA: Diagnosis not present

## 2016-04-10 DIAGNOSIS — G40909 Epilepsy, unspecified, not intractable, without status epilepticus: Secondary | ICD-10-CM | POA: Diagnosis not present

## 2016-04-10 MED ORDER — NA SULFATE-K SULFATE-MG SULF 17.5-3.13-1.6 GM/177ML PO SOLN: 1.0000 | Freq: Once | ORAL | 0 refills | Status: AC

## 2016-04-10 NOTE — Progress Notes (Signed)
HPI :  61 y/o female with a history of a seizure disorder, fibromyalgia, FH of colon cancer, here for first time CRC screening. She has 2 aunts diagnosed at age 66s with colon cancer and one uncle diagnosed at older age with colon cancer.   No blood in the stools. No constipation or diarrhea. No abdominal pains. No weight loss. She denies any abdominal complaints and generally is feeling well.   She has a seizure disorder, she thinks has been ongoing for a while for (> 10 years) and dx with postural hypotension with vaso-vagal manifestations at that time by [facility], but now recently diagnosed with seizures disorder in January or so. She is taking Keppra and Neurontin for this issue since early February.  She thinks the last time she had a seizure was early March, about 3 weeks ago. She had been having a seizure a few times per month previously, now only once since being on meds and generally well controlled and significantly improved.    Past Medical History:  Diagnosis Date  . Anxiety   . Asthma   . Depression   . Elevated blood pressure reading 01/09/2016  . Fibromyalgia   . Seizure disorder [facility])      Past Surgical History:  Procedure Laterality Date  . BREAST BIOPSY Right    biopsy in the 1990's  . BUNIONECTOMY Bilateral    2000's - R twice and L once  . CARPAL TUNNEL RELEASE Bilateral    2000's  . TUBAL LIGATION  1984   Family History  Problem Relation Age of Onset  . Breast cancer Mother   . Diabetes Brother   . Hypertension Brother   . Stroke Brother   . Breast cancer Maternal Grandmother   . Breast cancer Maternal Aunt     x 3  . Colon cancer Paternal Aunt     x 2  . Colon cancer Paternal Uncle    Social History  Substance Use Topics  . Smoking status: Never Smoker  . Smokeless tobacco: Never Used  . Alcohol use No   Current Outpatient Prescriptions  Medication Sig Dispense Refill  . DULoxetine (CYMBALTA) 30 MG capsule TAKE 3 CAPSULES BY MOUTH DAILY 90  capsule 5  . gabapentin (NEURONTIN) 300 MG capsule Take 1 capsule (300 mg total) by mouth 3 (three) times daily as needed. 90 capsule 1  . levETIRAcetam (KEPPRA) 750 MG tablet Take 1 tablet (750 mg total) by mouth 2 (two) times daily. 60 tablet 12  . meloxicam (MOBIC) 7.5 MG tablet Take 1 tablet (7.5 mg total) by mouth daily as needed for pain. 30 tablet 2  . Vitamin D, Ergocalciferol, (DRISDOL) 50000 units CAPS capsule TAKE 1 CAPSULE (50,000 UNITS TOTAL) BY MOUTH EVERY 7 (SEVEN) DAYS. 4 capsule 2  . Na Sulfate-K Sulfate-Mg Sulf 17.5-3.13-1.6 GM/180ML SOLN Take 1 kit by mouth once. 354 mL 0   No current facility-administered medications for this visit.    Allergies  Allergen Reactions  . Ambien [Zolpidem Tartrate] Other (See Comments)    Other reaction(s): Hallucination  . Soma [Carisoprodol] Other (See Comments)    chills     Review of Systems: All systems reviewed and negative except where noted in HPI.   Lab Results  Component Value Date   WBC 7.2 12/02/2015   HGB 14.1 12/02/2015   HCT 40.8 12/02/2015   MCV 94.5 12/02/2015   PLT 272 09/21/2015    Lab Results  Component Value Date   CREATININE 0.79 12/02/2015  BUN 11 12/02/2015   NA 138 12/02/2015   K 3.7 12/02/2015   CL 102 12/02/2015   CO2 30 12/02/2015    Lab Results  Component Value Date   ALT 21 12/02/2015   AST 20 12/02/2015   ALKPHOS 80 12/02/2015   BILITOT 0.4 12/02/2015     Physical Exam: BP 124/86 (BP Location: Left Arm, Patient Position: Sitting, Cuff Size: Normal)   Pulse 88   Ht 4' 11.5" (1.511 m) Comment: height measured without shoes  Wt 170 lb 6 oz (77.3 kg)   BMI 33.84 kg/m  Constitutional: Pleasant,well-developed,female in no acute distress. HEENT: Normocephalic and atraumatic. Conjunctivae are normal. No scleral icterus. Neck supple.  Cardiovascular: Normal rate, regular rhythm.  Pulmonary/chest: Effort normal and breath sounds normal. No wheezing, rales or rhonchi. Abdominal: Soft,  nondistended, nontender.  There are no masses palpable. No hepatomegaly. Extremities: no edema Lymphadenopathy: No cervical adenopathy noted. Neurological: Alert and oriented to person place and time. Skin: Skin is warm and dry. No rashes noted. Psychiatric: Normal mood and affect. Behavior is normal.   ASSESSMENT AND PLAN: 61 year old female with a history significant for seizure disorder and multiple second degree family members with colon cancer, here to discuss colon cancer screening.  She is higher than average risk for colon cancer given multiple second degree family members with colon cancer, and is overdue for her first time screening. In regards to screening options I'm recommending optical colonoscopy for given her family history of colon cancer. I discussed the risks and benefits of colonoscopy and anesthesia with her. She has recently been started on seizure medication over the past 1-2 months which has significantly improved her seizures. That being said, I think we should wait another few months before proceeding with colonoscopy to ensure her seizures are well-controlled on her current regimen prior to giving her anesthesia. We will tentatively schedule her case for June. If for some reason her seizures are poorly controlled in the interim she should contact me for reassessment.   [place] Cellar, MD [facility] Pager [phone]  CC: Wardell Honour, MD

## 2016-04-10 NOTE — Patient Instructions (Signed)
If you are age 59 or older, your body mass index should be between 23-30. Your Body mass index is 33.84 kg/m. If this is out of the aforementioned range listed, please consider follow up with your Primary Care Provider.  If you are age 72 or younger, your body mass index should be between 19-25. Your Body mass index is 33.84 kg/m. If this is out of the aformentioned range listed, please consider follow up with your Primary Care Provider.   We have sent the following medications to your pharmacy for you to pick up at your convenience:  [place] have been scheduled for a colonoscopy. Please follow written instructions given to you at your visit today.  Please pick up your prep supplies at the pharmacy within the next 1-3 days. If you use inhalers (even only as needed), please bring them with you on the day of your procedure. Your physician has requested that you go to www.startemmi.com and enter the access code given to you at your visit today. This web site gives a general overview about your procedure. However, you should still follow specific instructions given to you by our office regarding your preparation for the procedure.  Thank you.

## 2016-04-12 DIAGNOSIS — F4321 Adjustment disorder with depressed mood: Secondary | ICD-10-CM | POA: Diagnosis not present

## 2016-04-15 ENCOUNTER — Encounter: Payer: Self-pay | Admitting: Family Medicine

## 2016-04-16 ENCOUNTER — Encounter: Payer: Self-pay | Admitting: Family Medicine

## 2016-04-16 DIAGNOSIS — F4321 Adjustment disorder with depressed mood: Secondary | ICD-10-CM | POA: Diagnosis not present

## 2016-04-18 ENCOUNTER — Ambulatory Visit (INDEPENDENT_AMBULATORY_CARE_PROVIDER_SITE_OTHER): Payer: BLUE CROSS/BLUE SHIELD | Admitting: Family Medicine

## 2016-04-18 ENCOUNTER — Encounter: Payer: Self-pay | Admitting: Family Medicine

## 2016-04-18 ENCOUNTER — Encounter: Payer: Self-pay | Admitting: Neurology

## 2016-04-18 VITALS — BP 126/91 | HR 94 | Temp 98.4°F | Resp 18 | Ht 59.5 in | Wt 168.0 lb

## 2016-04-18 DIAGNOSIS — F330 Major depressive disorder, recurrent, mild: Secondary | ICD-10-CM

## 2016-04-18 DIAGNOSIS — Z23 Encounter for immunization: Secondary | ICD-10-CM

## 2016-04-18 DIAGNOSIS — Z114 Encounter for screening for human immunodeficiency virus [HIV]: Secondary | ICD-10-CM | POA: Diagnosis not present

## 2016-04-18 DIAGNOSIS — F418 Other specified anxiety disorders: Secondary | ICD-10-CM | POA: Diagnosis not present

## 2016-04-18 DIAGNOSIS — F419 Anxiety disorder, unspecified: Secondary | ICD-10-CM

## 2016-04-18 DIAGNOSIS — Z1159 Encounter for screening for other viral diseases: Secondary | ICD-10-CM

## 2016-04-18 DIAGNOSIS — M797 Fibromyalgia: Secondary | ICD-10-CM

## 2016-04-18 DIAGNOSIS — F32A Depression, unspecified: Secondary | ICD-10-CM

## 2016-04-18 DIAGNOSIS — R404 Transient alteration of awareness: Secondary | ICD-10-CM | POA: Diagnosis not present

## 2016-04-18 DIAGNOSIS — F329 Major depressive disorder, single episode, unspecified: Secondary | ICD-10-CM

## 2016-04-18 MED ORDER — DULOXETINE HCL 60 MG PO CPEP: 120.0000 mg | ORAL_CAPSULE | Freq: Every day | ORAL | 5 refills | Status: DC

## 2016-04-18 NOTE — Progress Notes (Signed)
Subjective:    Patient ID: Rebekah [name], female    DOB: [birth year], 61 y.o.   MRN: [id number]  04/18/2016  medication check (patient states she wants to make sure medication is not contradicting each other; states she does not feel well); Depression (states her depression is getting worse); and Immunizations (Prevnar, Dietitian; discussed with patient and she agrees to have)   HPI This 61 y.o. female presents for three month follow-up of depression/anxiety, arthralgias, altered mental status. Started feeling mad and angry two weeks ago.  Cannot cope with husband being in [place].  During church, daughter noticed that patient not all there.  Gets them in bed.  Feels dizzy; sees things going round and round but does not feel dizzy. Occurred while leaving church; had to sit down; also occurred during lunch.  Not sure of events.  Seeing counselor; seeing counselor every week; started seeing counselor for the past five weeks.  The church recommended counselor.  [name] at [facility].  Taking Cymbalta 30mg  three daily.    Started Keppra since December 2017.  Had a seizure in January.  No seizure in [place] on 550mg  of Keppra.  In March, had one while talking with son; increased Keppra to 750mg  bid.  Was talking with husband and does not remember anything of conversation.  Discussed side effect of medication with neurologist who recommended pt discuss with me.  Curious if needs to decrease Gabapentin.  Denies SI.  Just wants to be alone.  No SI.  Taking Gabapentin 300mg  tid; taking 2 in morning and 1 at night.  Situation is the same; husband is saying that marriage is over; previously was hopeful that husband would return home; husband advised pt that marriage is over last week.  Not making pt feel any better.  DDD cervical spine is still painful yet feel due to fibromyalgia.  Must feel well; daughter is about to have a baby.  Lives with daughter.  Has a monthly calendar.  Has not recollection of  conversation with husband. [place] with husband.  Rheumatologist confirmed fibromyalgia dx; pt already aware of that.     Immunization History  Administered Date(s) Administered  . Influenza,inj,Quad PF,36+ Mos 11/10/2014, 10/27/2015   BP Readings from Last 3 Encounters:  04/18/16 (!) 126/91  04/10/16 124/86  01/25/16 138/87   Wt Readings from Last 3 Encounters:  04/18/16 168 lb (76.2 kg)  04/10/16 170 lb 6 oz (77.3 kg)  01/25/16 173 lb (78.5 kg)    Review of Systems  Constitutional: Negative for chills, diaphoresis, fatigue and fever.  Eyes: Negative for visual disturbance.  Respiratory: Negative for cough and shortness of breath.   Cardiovascular: Negative for chest pain, palpitations and leg swelling.  Gastrointestinal: Negative for abdominal pain, constipation, diarrhea, nausea and vomiting.  Endocrine: Negative for cold intolerance, heat intolerance, polydipsia, polyphagia and polyuria.  Musculoskeletal: Positive for arthralgias, neck pain and neck stiffness.  Neurological: Positive for dizziness. Negative for tremors, seizures, syncope, facial asymmetry, speech difficulty, weakness, light-headedness, numbness and headaches.  Psychiatric/Behavioral: Positive for dysphoric mood. The patient is nervous/anxious.     Past Medical History:  Diagnosis Date  . Anxiety   . Asthma   . Depression   . Elevated blood pressure reading 01/09/2016  . Fibromyalgia   . Seizure disorder [facility])    Past Surgical History:  Procedure Laterality Date  . BREAST BIOPSY Right    biopsy in the 1990's  . BUNIONECTOMY Bilateral    2000's - R twice and L once  .  CARPAL TUNNEL RELEASE Bilateral    2000's  . TUBAL LIGATION  1984   Allergies  Allergen Reactions  . Ambien [Zolpidem Tartrate] Other (See Comments)    Other reaction(s): Hallucination  . Soma [Carisoprodol] Other (See Comments)    chills    Social History   Social History  . Marital status: Married    Spouse name: N/A   . Number of children: 4  . 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 Smoking status: Never Smoker  . Smokeless tobacco: Never Used  . Alcohol use No  . Drug use: No  . Sexual activity: Not on file   Other Topics Concern  . Not on file   Social History Narrative   Lives with daughter, Rebekah Dun "Nicole Kindred"   Caffeine use: coffee/tea/soda daily   Family History  Problem Relation Age of Onset  . Breast cancer Mother   . Diabetes Brother   . Hypertension Brother   . Stroke Brother   . Breast cancer Maternal Grandmother   . Breast cancer Maternal Aunt     x 3  . Colon cancer Paternal Aunt     x 2  . Colon cancer Paternal Uncle        Objective:    BP (!) 126/91   Pulse 94   Temp 98.4 F (36.9 C) (Oral)   Resp 18   Ht 4' 11.5" (1.511 m)   Wt 168 lb (76.2 kg) Comment: 166.8 weight at home this morning  SpO2 94%   BMI 33.36 kg/m  Physical Exam  Constitutional: She is oriented to person, place, and time. She appears well-developed and well-nourished. No distress.  HENT:  Head: Normocephalic and atraumatic.  Right Ear: External ear normal.  Left Ear: External ear normal.  Nose: Nose normal.  Mouth/Throat: Oropharynx is clear and moist.  Eyes: Conjunctivae and EOM are normal. Pupils are equal, round, and reactive to light.  Neck: Normal range of motion. Neck supple. Carotid bruit is not present. No thyromegaly present.  Cardiovascular: Normal rate, regular rhythm, normal heart sounds and intact distal pulses.  Exam reveals no gallop and no friction rub.   No murmur heard. Pulmonary/Chest: Effort normal and breath sounds normal. She has no wheezes. She has no rales.  Abdominal: Soft. Bowel sounds are normal. She exhibits no distension and no mass. There is no tenderness. There is no rebound and no guarding.  Lymphadenopathy:    She has no cervical adenopathy.  Neurological: She is alert and oriented to person, place, and  time. No cranial nerve deficit.  Skin: Skin is warm and dry. No rash noted. She is not diaphoretic. No erythema. No pallor.  Psychiatric: She has a normal mood and affect. Her behavior is normal.   Depression screen [facility] 2/9 04/18/2016 01/25/2016 12/02/2015 10/27/2015 09/21/2015  Decreased Interest 3 0 2 0 2  Down, Depressed, Hopeless 3 0 2 3 3   PHQ - 2 Score 6 0 4 3 5   Altered sleeping 3 - 2 1 0  Tired, decreased energy 3 - 2 1 2   Change in appetite 3 - 2 3 0  Feeling bad or failure about yourself  3 - 2 3 3   Trouble concentrating 3 - 2 0 0  Moving slowly or fidgety/restless 1 - 2 0 0  Suicidal thoughts 0 - 0 0 0  PHQ-9 Score 22 - 16 11 10   Difficult doing work/chores Extremely dIfficult -  Somewhat difficult Not difficult at all Somewhat difficult  Some recent data might be hidden        Assessment & Plan:   1. Anxiety and depression   2. Transient alteration of awareness   3. Encounter for hepatitis C screening test for low risk patient   4. Screening for HIV (human immunodeficiency virus)   5. Fibromyalgia   6. Mild episode of recurrent major depressive disorder ([place])   7. Need for Tdap vaccination   8. Need for prophylactic vaccination against Streptococcus pneumoniae (pneumococcus)    -increased anger and irritability due to marital strain; agree with increasing Cymbalta to 120mg  daily for anxiety/depression and fibromyalgia. -pt expresses concern regarding polypharmacy and side effects to multiple medications; agree with weaning gabapentin at this time as was initiated for cervical neuropathy that has resolved.   -recommend touching bas with neurology regarding wean of Gabapentin as pt was recently initiated on Keppra by neurology.  ( s/p EEG and 72 hour EEG; s/p MRI brain as well) -obtain labs. -s/p TDAP and pneumovax.   Orders Placed This Encounter  Procedures  . Tdap vaccine greater than or equal to 7yo IM  . Pneumococcal polysaccharide vaccine 23-valent greater than or  equal to 2yo subcutaneous/IM  . CBC with Differential/Platelet  . Comprehensive metabolic panel  . Vitamin B12  . VITAMIN D 25 Hydroxy (Vit-D Deficiency, Fractures)  . HIV antibody  . Hepatitis C antibody  . POCT urinalysis dipstick   Meds ordered this encounter  Medications  . DULoxetine (CYMBALTA) 60 MG capsule    Sig: Take 2 capsules (120 mg total) by mouth daily.    Dispense:  60 capsule    Refill:  5    Return in about 4 weeks (around 05/16/2016) for recheck.   Kristi Elayne Guerin, M.D. Primary Care at [facility] [address] [place], [place]  [address] [phone] phone [phone] fax

## 2016-04-18 NOTE — Patient Instructions (Addendum)
1. Wean gabapentin over the following three weeks: -decrease gabapentin 300mg  to one tablet twice daily for one week and then decrease to one tablet at bedtime for one week, then stop. 2.  Increase Cymbalta to 120mg  total per day (4 tablets of 30mg  prescription until gone). 3. Call neurologist for an appointment.    IF you received an x-ray today, you will receive an invoice from [facility]. Please contact [facility] at [phone] with questions or concerns regarding your invoice.   IF you received labwork today, you will receive an invoice from [facility]. Please contact [facility] at [phone] with questions or concerns regarding your invoice.   Our billing staff will not be able to assist you with questions regarding bills from these companies.  You will be contacted with the lab results as soon as they are available. The fastest way to get your results is to activate your My Chart account. Instructions are located on the last page of this paperwork. If you have not heard from [place] regarding the results in 2 weeks, please contact this office.

## 2016-04-19 LAB — CBC WITH DIFFERENTIAL/PLATELET
BASOS ABS: 0 10*3/uL (ref 0.0–0.2)
Basos: 0 %
EOS (ABSOLUTE): 0.2 10*3/uL (ref 0.0–0.4)
Eos: 2 %
Hematocrit: 44.2 % (ref 34.0–46.6)
Hemoglobin: 15.1 g/dL (ref 11.1–15.9)
IMMATURE GRANULOCYTES: 0 %
Immature Grans (Abs): 0 10*3/uL (ref 0.0–0.1)
LYMPHS: 38 %
Lymphocytes Absolute: 2.7 10*3/uL (ref 0.7–3.1)
MCH: 32.1 pg (ref 26.6–33.0)
MCHC: 34.2 g/dL (ref 31.5–35.7)
MCV: 94 fL (ref 79–97)
MONOS ABS: 0.6 10*3/uL (ref 0.1–0.9)
Monocytes: 9 %
NEUTROS PCT: 51 %
Neutrophils Absolute: 3.7 10*3/uL (ref 1.4–7.0)
PLATELETS: 323 10*3/uL (ref 150–379)
RBC: 4.7 x10E6/uL (ref 3.77–5.28)
RDW: 13.3 % (ref 12.3–15.4)
WBC: 7.2 10*3/uL (ref 3.4–10.8)

## 2016-04-19 LAB — HEPATITIS C ANTIBODY

## 2016-04-19 LAB — COMPREHENSIVE METABOLIC PANEL
ALT: 17 IU/L (ref 0–32)
AST: 19 IU/L (ref 0–40)
Albumin/Globulin Ratio: 1.5 (ref 1.2–2.2)
Albumin: 4.3 g/dL (ref 3.5–5.5)
Alkaline Phosphatase: 92 IU/L (ref 39–117)
BUN/Creatinine Ratio: 20 (ref 9–23)
BUN: 16 mg/dL (ref 6–24)
Bilirubin Total: 0.3 mg/dL (ref 0.0–1.2)
CALCIUM: 9.2 mg/dL (ref 8.7–10.2)
CHLORIDE: 98 mmol/L (ref 96–106)
CO2: 23 mmol/L (ref 18–29)
Creatinine, Ser: 0.81 mg/dL (ref 0.57–1.00)
GFR calc non Af Amer: 80 mL/min/{1.73_m2} (ref >59)
GFR, EST AFRICAN AMERICAN: 93 mL/min/{1.73_m2} (ref >59)
GLUCOSE: 89 mg/dL (ref 65–99)
Globulin, Total: 2.9 g/dL (ref 1.5–4.5)
Potassium: 4.5 mmol/L (ref 3.5–5.2)
Sodium: 138 mmol/L (ref 134–144)
TOTAL PROTEIN: 7.2 g/dL (ref 6.0–8.5)

## 2016-04-19 LAB — VITAMIN B12: Vitamin B-12: 355 pg/mL (ref 232–1245)

## 2016-04-19 LAB — HIV ANTIBODY (ROUTINE TESTING W REFLEX): HIV SCREEN 4TH GENERATION: NONREACTIVE

## 2016-04-19 LAB — VITAMIN D 25 HYDROXY (VIT D DEFICIENCY, FRACTURES): Vit D, 25-Hydroxy: 44 ng/mL (ref 30.0–100.0)

## 2016-04-22 ENCOUNTER — Encounter: Payer: Self-pay | Admitting: Family Medicine

## 2016-05-02 DIAGNOSIS — F4321 Adjustment disorder with depressed mood: Secondary | ICD-10-CM | POA: Diagnosis not present

## 2016-05-06 ENCOUNTER — Encounter: Payer: Self-pay | Admitting: Neurology

## 2016-05-09 ENCOUNTER — Encounter: Payer: Self-pay | Admitting: Neurology

## 2016-05-09 ENCOUNTER — Ambulatory Visit (INDEPENDENT_AMBULATORY_CARE_PROVIDER_SITE_OTHER): Payer: BLUE CROSS/BLUE SHIELD | Admitting: Neurology

## 2016-05-09 VITALS — Ht 59.0 in | Wt 166.8 lb

## 2016-05-09 DIAGNOSIS — F445 Conversion disorder with seizures or convulsions: Secondary | ICD-10-CM | POA: Diagnosis not present

## 2016-05-09 DIAGNOSIS — R419 Unspecified symptoms and signs involving cognitive functions and awareness: Secondary | ICD-10-CM

## 2016-05-09 NOTE — Progress Notes (Signed)
Was watching reality TV and saw a girl that fell and feels that she has the same thing - blacking-out, loss of awareness, weakness - epilepsy. Reports increased confusion and "seizure" in early March when on the phone w/ her son.

## 2016-05-09 NOTE — Progress Notes (Signed)
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 She is feeling better on the keppra She feels imbalance but admits she is "extremely lazy" and rarely exercises and does no balance exercises at home. Will continue on the [place]. She has depression and PTSD.Discussed non-epileptic/psychogenic events, that she should continue to follow with her counselor (she cries in the office today). She goes to her counselor every week.   Rebekah [name] a 61 y.o. femalehere as a referral from Dr. Denzil Magnuson alteration of consciousness episodes for over 10 years. Past medical history depression, anxiety, fibromyalgia. She has episodes that started 10 years ago maybe earlier(per [facility] notes has beenongoing for 10 years andat [facility] Dxed with severe postural hypotension associated with vaso-vagal manifestations) . Episodes where she feels them coming on, her vision dims, her eyes close and she goes limp, she can hear people but can't respond, no urination or tongue biting. Can last up to 10 minutes with eyes closed.   MRI brain and MRA of the head, routine eeg and 72-hour eeg were all unremarkable, no etiology for her episodes was found. She is wearing a heart monitor currently. The last episode she had was in December and had one in in November. The three-day eeg was normal but she did not have any episodes. She has been wearing a heart monitor but no events. She has the episodes 1-2x a month. She denies any stress or strong emotions, spoke to her sister on the phone who says it happened in the car when sister was driving and patient was not responding, her eyes were closed, she wouldn't respond, she was sitting with eyes closed like she was asleep, no abnormal movements, she  was tired afterwards but no confusion, the episode lasted 15 minutes, she would respond when her name was called she would lift her finger like she was saying "wait a minute", patient does not remember sister talking to her.     UMP:NTIRWE [name] a 61 y.o.femalehere as a referral from Dr. Denzil Magnuson alteration of consciousness. Past medical history depression, anxiety, fibromyalgia. She has episodes that started 10 years ago maybe earlier(per [facility] notes has been ongoing for 10 years and at [facility] Dxed with severe postural hypotension associated with vaso-vagal manifestations) but def worsened in 2014. She had 6 events on the way to the hospital when her daughter was pregnant. She was no admitted to [facility]. She was sent home. She gets a feeling coming up to her head and she "blacks out" and gets weak. She does not remember. She is here alone. She can hear everyone when it is happening but she can;t communicate she "zones out". She does not fall on the ground, has never fallen or hurt herself. No tongue biting or urination. She loses all strength. She remembers having the episode and she knows where she is when she "comes to". They last 2-5 minutes with no confusion afterwards. She feels them coming on, she can sit down, She feel lightheaded and feels her vision dimming. The episodes happen once a month. The last one was 25th of august and then on September 4th. She was driving the last one. Unknown triggers, not in the setting of stress, can be sitting or standing or laying down in bed. No other associated symptoms or modifying factors.No FHx  of seizures. per husband she feels when it is coming, seemed liked she was goinmg to sleep, eyes closed during episodes, after a few seconds she becomes limp, up to 10 minutes, she wakes up and after a few seconds she knows what happened takes 30 minutes to regain strength. She can hear during episodes but can't tell what he says.    Reviewed notes, labs  and imaging from outside physicians, which showed:  BUN 14, creatinine 0.8, TSH 1.27 September 2015.   Per [facility] notes 2014, reviewed notes:   The patient has a history of hypertension for several years, but no history of myocardial infarction, angina, heart failure, symptomatic rhythm disturbances or valvular heart disease. Accompanied by her daughter, who provides much of the history. The patient was "somewhat sketchy about details of her remote history", but saidfrequent episodes of "passing out" over the course of 10 years or more. Since 2011 experiencing intermittent episodes in which she became essentially unresponsive, associated with dimming out of her vision and a vague history of pressure from her diaphragm upward to her head. These episodes generally last from 2-5 minutes, and spontaneously dissipate. She says that she can usually hear other people talking during these times, but is unable to respond. No witnessed seizure activity or actual loss of consciousness. These episodes previously occurred only once or twice a month, and she said that she has never been evaluated for them. However, more recently had become considerably more frequent, and for that reason she presented for evaluation at the [facility] emergency department on 05/21/2012. At that time her diagnostic evaluation was unrevealing, with a normal electrocardiogram and otherwise unremarkable laboratory studies.The patient's daughter reportedthat her episodes associated with an apparently normal heart rhythm, and she hadbeen able to measure her blood pressure during these spells.    1. Dizziness and unresponsiveness As demonstrated during her visit, the patient diagnosed with episodes of severe postural hypotension associated with vaso-vagal manifestations.It was recommended discontinuing her anti-hypertensive medication, and making a concerted effort to maintain a well-hydrated state.   Pertinent other findings: Supine blood  pressure was recorded at 120/85. The patient was then asked to stand, and the blood pressure was recorded soon afterwards at 578 systolic. After approximately a minute, the patient began to feel oddly and appeared less responsive. Her blood pressure was recorded at approximately 80 systolic. She was then assisted to a chair, but her blood pressure then became difficult to determine, and her heart rate was noted to be in the 40 range. She became less responsive and reported nausea, and she was carefully assisted to the floor, and her feet elevated. Over the course of approximately 5 minutes she gradually improved, and returned to her baseline status, with systolic blood pressure in the 120 range.   CT head 2014(report only no images available): No hemorrhage, extra-axial fluid collection, cerebral edema, acute cortical infarction, mass, mass effect, midline shift. Ventricles and sulci are normal for patient age. Normal paranasal sinuses, orbits and cranium.  IMPRESSION: No acute intracranial abnormalities.  LP 2014 showed nml CSF    Review of Systems: Patient complains of symptoms per HPI as well as the following symptoms: fatigue, headache, joint pain, aching muscles, depression, anxiety. Pertinent negatives per HPI. All others negative.    Social History   Social History  . Marital status: Married    Spouse name: N/A  . Number of children: 4  . 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 Smoking status: Never Smoker  . Smokeless tobacco: Never Used  . Alcohol use No  . Drug use: No  . Sexual activity: Not on file   Other Topics Concern  . Not on file   Social History Narrative   Lives with daughter, [name]   Caffeine use: coffee/tea/soda daily    Family History  Problem Relation Age of Onset  . Breast cancer Mother   . Diabetes Brother   . Hypertension Brother   . Stroke Brother   . Breast cancer  Maternal Grandmother   . Breast cancer Maternal Aunt     x 3  . Colon cancer Paternal Aunt     x 2  . Colon cancer Paternal Uncle     Past Medical History:  Diagnosis Date  . Anxiety   . Asthma   . Depression   . Elevated blood pressure reading 01/09/2016  . Fibromyalgia   . Seizure disorder [facility])     Past Surgical History:  Procedure Laterality Date  . BREAST BIOPSY Right    biopsy in the 1990's  . BUNIONECTOMY Bilateral    2000's - R twice and L once  . CARPAL TUNNEL RELEASE Bilateral    2000's  . TUBAL LIGATION  1984    Current Outpatient Prescriptions  Medication Sig Dispense Refill  . DULoxetine (CYMBALTA) 60 MG capsule Take 2 capsules (120 mg total) by mouth daily. 60 capsule 5  . levETIRAcetam (KEPPRA) 750 MG tablet Take 1 tablet (750 mg total) by mouth 2 (two) times daily. 60 tablet 12  . meloxicam (MOBIC) 7.5 MG tablet Take 1 tablet (7.5 mg total) by mouth daily as needed for pain. 30 tablet 2  . Vitamin D, Ergocalciferol, (DRISDOL) 50000 units CAPS capsule TAKE 1 CAPSULE (50,000 UNITS TOTAL) BY MOUTH EVERY 7 (SEVEN) DAYS. 4 capsule 2   No current facility-administered medications for this visit.     Allergies as of 05/09/2016 - Review Complete 05/09/2016  Allergen Reaction Noted  . Ambien [zolpidem tartrate] Other (See Comments) 08/27/2014  . Soma [carisoprodol] Other (See Comments) 08/27/2014    Vitals: Ht 4\' 11"  (1.499 m)   Wt 166 lb 12.8 oz (75.7 kg)   BMI 33.69 kg/m  Last Weight:  Wt Readings from Last 1 Encounters:  05/09/16 166 lb 12.8 oz (75.7 kg)   Last Height:   Ht Readings from Last 1 Encounters:  05/09/16 4\' 11"  (1.499 m)     Physical exam: Exam: Gen: NAD, conversant, well nourised, obese, well groomed  CV: RRR, no MRG. No Carotid Bruits. No peripheral edema, warm, nontender Eyes: Conjunctivae clear without exudates or hemorrhage  Neuro: Detailed Neurologic Exam  Speech: Speech is normal; fluent and  spontaneous with normal comprehension.  Cognition: The patient is oriented to person, place, and time;  recent and remote memory intact;  language fluent;  normal attention, concentration,  fund of knowledge Cranial Nerves: The pupils are equal, round, and reactive to light. The fundi are normal and spontaneous venous pulsations are present. Visual fields are full to finger confrontation. Extraocular movements are intact. Trigeminal sensation is intact and the muscles of mastication are normal. The face is symmetric. The palate elevates in the midline. Hearing intact. Voice is normal. Shoulder shrug is normal. The tongue has normal motion without fasciculations.   Coordination: Normal finger to nose and heel to shin. Normal rapid alternating movements.   Gait: Heel-toe and tandem gait are normal.   Motor Observation:  No asymmetry, no atrophy, and no involuntary movements noted. Tone: Normal muscle tone.   Posture: Posture is normal. normal erect  Strength: Strength is V/V in the upper and lower limbs.   Sensation: intact to LT  Reflex Exam:  DTR's: Deep tendon reflexes in the upper and lower extremities are normal bilaterally.  Toes: The toes are downgoing bilaterally.  Clonus: Clonus is absent.    Assessment/Plan:Sharon [name] a 61 y.o. femalehere as a referral from Dr. Denzil Magnuson alteration of consciousness episodes for over 10 years. Past medical history depression, anxiety, fibromyalgia. She has episodes that started 10 years ago maybe earlier(per [facility] notes has beenongoing for 10 years andat [facility] Dxed with severe postural hypotension associated with vaso-vagal manifestations) . Episodes where she feels them coming on, her vision dims, her eyes close and she goes limp, she can hear people but can;t respond, no urination or tongue biting. Can last up to 10-15 minutes with eyes closed. Does not  appear to be seizure events, may be vasovagal as per [facility] or non-epileptic events.   Feel events may be psychogenic or non-epileptic in nature but cannot know for sure. Will continue Keppra for now. Seizure precautions. No driving.  - Patient is unable to drive, operate heavy machinery, perform activities at heights or participate in water activities until 6 months event free, discussed this with her again today. - If possible when patient is having an event I would like however with her to videotape on her phone so that I can review it.  - Imaging, routine EEG and 72 hour EEG were all unremarkable however patient did not have an episode during EEGs. Feel seizures are less likely given history and normal workup. Still given that there is no etiology if cardiac workup is negative we can trial some low-dose keppra.  - Patient is currently being evaluated by cardiology with heart monitor. If no event is captured I recommend a loop recorder that can monitor for upwards of 2 years.   Cc: Wardell Honour, MD  Sarina Ill, MD  [facility] [address] [place], [place] [address]  Phone [phone] Fax [phone]  A total of 25  minutes was spent face-to-face with this patient. Over half this time was spent on counseling patient on the alteration of consciousness diagnosis and different diagnostic and therapeutic options available.

## 2016-05-09 NOTE — Patient Instructions (Signed)
Remember to drink plenty of fluid, eat healthy meals and do not skip any meals. Try to eat protein with a every meal and eat a healthy snack such as fruit or nuts in between meals. Try to keep a regular sleep-wake schedule and try to exercise daily, particularly in the form of walking, 20-30 minutes a day, if you can.   As far as your medications are concerned, I would like to suggest: Continue Keppra  I would like to see you back in 6 months, sooner if we need to. Please call us with any interim questions, concerns, problems, updates or refill requests.   Our phone number is [phone]. We also have an after hours call service for urgent matters and there is a physician on-call for urgent questions. For any emergencies you know to call 911 or go to the nearest emergency room

## 2016-05-11 DIAGNOSIS — F4321 Adjustment disorder with depressed mood: Secondary | ICD-10-CM | POA: Diagnosis not present

## 2016-05-15 ENCOUNTER — Encounter: Payer: Self-pay | Admitting: Neurology

## 2016-05-15 ENCOUNTER — Other Ambulatory Visit: Payer: Self-pay | Admitting: Neurology

## 2016-05-15 MED ORDER — LEVETIRACETAM 1000 MG PO TABS: 1000.0000 mg | ORAL_TABLET | Freq: Two times a day (BID) | ORAL | 6 refills | Status: DC

## 2016-05-15 NOTE — Telephone Encounter (Signed)
Pt was seen just last week (05/09/16) for OV, evaluated yrs ago at [facility] and diagnosed w/ postural hypotension associated w/ vasovagal manifestations. MRI/MRA of the head, routine/72 hr EEG unremarkable. She has appt scheduled w/ family MD tomorrow and a 6 mo neuro f/u w/ Megan in Oct.

## 2016-05-16 ENCOUNTER — Ambulatory Visit: Payer: BLUE CROSS/BLUE SHIELD | Admitting: Family Medicine

## 2016-05-16 DIAGNOSIS — F4321 Adjustment disorder with depressed mood: Secondary | ICD-10-CM | POA: Diagnosis not present

## 2016-05-19 ENCOUNTER — Encounter: Payer: Self-pay | Admitting: Family Medicine

## 2016-05-19 ENCOUNTER — Ambulatory Visit (INDEPENDENT_AMBULATORY_CARE_PROVIDER_SITE_OTHER): Payer: BLUE CROSS/BLUE SHIELD | Admitting: Family Medicine

## 2016-05-19 VITALS — BP 115/79 | HR 86 | Temp 98.7°F | Resp 16 | Ht 59.5 in | Wt 167.0 lb

## 2016-05-19 DIAGNOSIS — F445 Conversion disorder with seizures or convulsions: Secondary | ICD-10-CM | POA: Diagnosis not present

## 2016-05-19 DIAGNOSIS — F418 Other specified anxiety disorders: Secondary | ICD-10-CM | POA: Diagnosis not present

## 2016-05-19 DIAGNOSIS — M7732 Calcaneal spur, left foot: Secondary | ICD-10-CM

## 2016-05-19 DIAGNOSIS — K219 Gastro-esophageal reflux disease without esophagitis: Secondary | ICD-10-CM

## 2016-05-19 DIAGNOSIS — M797 Fibromyalgia: Secondary | ICD-10-CM | POA: Diagnosis not present

## 2016-05-19 NOTE — Patient Instructions (Addendum)
START EXERCISING IN THE GARAGE THREE TIMES PER WEEK.   IF you received an x-ray today, you will receive an invoice from [facility]. Please contact [facility] at [phone] with questions or concerns regarding your invoice.   IF you received labwork today, you will receive an invoice from [facility]. Please contact [facility] at [phone] with questions or concerns regarding your invoice.   Our billing staff will not be able to assist you with questions regarding bills from these companies.  You will be contacted with the lab results as soon as they are available. The fastest way to get your results is to activate your My Chart account. Instructions are located on the last page of this paperwork. If you have not heard from [place] regarding the results in 2 weeks, please contact this office.      Heel Spur A heel spur is a bony growth that forms on the bottom of your heel bone (calcaneus). Heel spurs are common and do not always cause pain. However, heel spurs often cause inflammation in the strong band of tissue that runs underneath the bone of your foot (plantar fascia). When this happens, you may feel pain on the bottom of your foot, near your heel. What are the causes? The cause of heel spurs is not completely understood. They may be caused by pressure on the heel. Or, they may stem from the muscle attachments (tendons) near the spur pulling on the heel. What increases the risk? You may be at risk for a heel spur if you:  Are older than 40.  Are overweight.  Have wear and tear arthritis (osteoarthritis).  Have plantar fascia inflammation. What are the signs or symptoms? Some people have heel spurs but no symptoms. If you do have symptoms, they may include:  Pain in the bottom of your heel.  Pain that is worse when you first get out of bed.  Pain that gets worse after walking or standing. How is this diagnosed? Your health care provider may diagnose a heel  spur based on your symptoms and a physical exam. You may also have an X-ray of your foot to check for a bony growth coming from the calcaneus. How is this treated? Treatment aims to relieve the pain from the heel spur. This may include:  Stretching exercises.  Losing weight.  Wearing specific shoes, inserts, or orthotics for comfort and support.  Wearing splints at night to properly position your feet.  Taking over-the-counter medicine to relieve pain.  Being treated with high-intensity sound waves to break up the heel spur (extracorporeal shock wave therapy).  Getting steroid injections in your heel to reduce swelling and ease pain.  Having surgery if your heel spur causes long-term (chronic) pain. Follow these instructions at home:  Take medicines only as directed by your health care provider.  Ask your health care provider if you should use ice or cold packs on the painful areas of your heel or foot.  Avoid activities that cause you pain until you recover or as directed by your health care provider.  Stretch before exercising or being physically active.  Wear supportive shoes that fit well as directed by your health care provider. You might need to buy new shoes. Wearing old shoes or shoes that do not fit correctly may not provide the support that you need.  Lose weight if your health care provider thinks you should. This can relieve pressure on your foot that may be causing pain and discomfort. Contact a  health care provider if:  Your pain continues or gets worse. This information is not intended to replace advice given to you by your health care provider. Make sure you discuss any questions you have with your health care provider. Document Released: 02/07/2005 Document Revised: 06/09/2015 Document Reviewed: 03/04/2013 Elsevier Interactive Patient Education  2017 [employer].

## 2016-05-19 NOTE — Progress Notes (Signed)
Subjective:    Patient ID: Rebekah [name], female    DOB: 09/28/1955, 61 y.o.   MRN: [id number]  05/19/2016  Medication Check; Foot Pain (heel); Gastroesophageal Reflux; and Positive PHQ   HPI This 61 y.o. female presents for evaluation of multiple medical concerns:  Anxiety and depression: management changes made at last visit included increasing Cymbalta to 120mg  daily and weaning off of Gabapentin.  Pt was also to discuss weaning Gabapentin with neurology.   So very sad about husband leaving and moving to [place]. Husband does not plan to return. Pt undergoing psychotherapy; denies SI/HI.  Daughter and granddaughter in [place].    B heel pain: s/p injections ten years ago; chronic heel pain; pain with weight bearing.  Does not like to walk barefooted; uses socks. Wears good supportive shoes.   GERD: taking OTC equate heartburn pills from [place].    Pseudoseizure: diagnosed with pseudoseizures by neurology.  Keppra has been increased by Dr. Jaynee Eagles to treat any undiagnosed seizure disorder.  Pt suffering with side effects including nausea.   Review of Systems  Constitutional: Negative for chills, diaphoresis, fatigue and fever.  Eyes: Negative for visual disturbance.  Respiratory: Negative for cough and shortness of breath.   Cardiovascular: Negative for chest pain, palpitations and leg swelling.  Gastrointestinal: Negative for abdominal pain, constipation, diarrhea, nausea and vomiting.  Endocrine: Negative for cold intolerance, heat intolerance, polydipsia, polyphagia and polyuria.  Musculoskeletal: Positive for arthralgias and gait problem. Negative for joint swelling.  Neurological: Negative for dizziness, tremors, seizures, syncope, facial asymmetry, speech difficulty, weakness, light-headedness, numbness and headaches.  Psychiatric/Behavioral: Positive for dysphoric mood. Negative for self-injury, sleep disturbance and suicidal ideas. The patient is nervous/anxious.     Past  Medical History:  Diagnosis Date  . Anxiety   . Asthma   . Depression   . Elevated blood pressure reading 01/09/2016  . Fibromyalgia   . Seizure disorder [facility])    Past Surgical History:  Procedure Laterality Date  . BREAST BIOPSY Right    biopsy in the 1990's  . BUNIONECTOMY Bilateral    2000's - R twice and L once  . CARPAL TUNNEL RELEASE Bilateral    2000's  . TUBAL LIGATION  1984   Allergies  Allergen Reactions  . Ambien [Zolpidem Tartrate] Other (See Comments)    Other reaction(s): Hallucination  . Soma [Carisoprodol] Other (See Comments)    chills    Social History   Social History  . Marital status: Married    Spouse name: N/A  . Number of children: 4  . 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 Smoking status: Never Smoker  . Smokeless tobacco: Never Used  . Alcohol use No  . Drug use: No  . Sexual activity: Not on file   Other Topics Concern  . Not on file   Social History Narrative   Lives with daughter, [name]   Caffeine use: coffee/tea/soda daily   Family History  Problem Relation Age of Onset  . Breast cancer Mother   . Diabetes Brother   . Hypertension Brother   . Stroke Brother   . Breast cancer Maternal Grandmother   . Breast cancer Maternal Aunt        x 3  . Colon cancer Paternal Aunt        x 2  . Colon cancer Paternal Uncle        Objective:  BP 115/79   Pulse 86   Temp 98.7 F (37.1 C) (Oral)   Resp 16   Ht 4' 11.5" (1.511 m)   Wt 167 lb (75.8 kg)   SpO2 95%   BMI 33.17 kg/m  Physical Exam  Constitutional: She is oriented to person, place, and time. She appears well-developed and well-nourished. No distress.  HENT:  Head: Normocephalic and atraumatic.  Right Ear: External ear normal.  Left Ear: External ear normal.  Nose: Nose normal.  Mouth/Throat: Oropharynx is clear and moist.  Eyes: Conjunctivae and EOM are normal. Pupils are equal, round, and  reactive to light.  Neck: Normal range of motion. Neck supple. Carotid bruit is not present. No thyromegaly present.  Cardiovascular: Normal rate, regular rhythm, normal heart sounds and intact distal pulses.  Exam reveals no gallop and no friction rub.   No murmur heard. Pulmonary/Chest: Effort normal and breath sounds normal. She has no wheezes. She has no rales.  Abdominal: Soft. Bowel sounds are normal. She exhibits no distension and no mass. There is no tenderness. There is no rebound and no guarding.  Musculoskeletal:       Right ankle: Normal.       Left ankle: Normal. Achilles tendon normal.       Right foot: There is decreased range of motion. There is no tenderness and no bony tenderness.       Left foot: There is normal range of motion, no tenderness and no bony tenderness.  Lymphadenopathy:    She has no cervical adenopathy.  Neurological: She is alert and oriented to person, place, and time. No cranial nerve deficit. She exhibits normal muscle tone. Coordination normal.  Skin: Skin is warm and dry. No rash noted. She is not diaphoretic. No erythema. No pallor.  Psychiatric: She has a normal mood and affect. Her behavior is normal. Judgment and thought content normal.    Depression screen [facility] 2/9 05/19/2016 04/18/2016 01/25/2016 12/02/2015 10/27/2015  Decreased Interest 2 3 0 2 0  Down, Depressed, Hopeless 3 3 0 2 3  PHQ - 2 Score 5 6 0 4 3  Altered sleeping 2 3 - 2 1  Tired, decreased energy 3 3 - 2 1  Change in appetite 3 3 - 2 3  Feeling bad or failure about yourself  3 3 - 2 3  Trouble concentrating 0 3 - 2 0  Moving slowly or fidgety/restless 1 1 - 2 0  Suicidal thoughts 0 0 - 0 0  PHQ-9 Score 17 22 - 16 11  Difficult doing work/chores Not difficult at all Extremely dIfficult - Somewhat difficult Not difficult at all  Some recent data might be hidden       Assessment & Plan:   1. Calcaneal spur of left foot   2. Fibromyalgia   3. Anxiety associated with depression     4. Pseudoseizure   5. Gastroesophageal reflux disease without esophagitis    -recurrent L>R foot pain due to calcaneal spur; discussed supportive care with heel cups, icing, rest, good supportive shoes.  Refer to podiatry. -anxiety and depression persistent yet improving. Counseling provided during visit; no changes to management at this time.  -diagnosed with pseudoseizures per neurology and maintained on Keppra; recommend contacting neurology regarding any side effects to Keppra.  -new onset GERD; recommend weight loss, exercise, and dietary modification.   Orders Placed This Encounter  Procedures  . Ambulatory referral to Podiatry    Referral Priority:   Routine  Referral Type:   Consultation    Referral Reason:   Specialty Services Required    Requested Specialty:   Podiatry    Number of Visits Requested:   1   No orders of the defined types were placed in this encounter.   Return in about 3 months (around 08/19/2016) for recheck.   Kristi Elayne Guerin, M.D. Primary Care at [facility] previously [facility] [address] [place], [place]  [address] [phone] phone [phone] fax

## 2016-05-23 ENCOUNTER — Encounter: Payer: Self-pay | Admitting: Family Medicine

## 2016-05-24 ENCOUNTER — Telehealth: Payer: Self-pay | Admitting: Family Medicine

## 2016-05-24 DIAGNOSIS — F4321 Adjustment disorder with depressed mood: Secondary | ICD-10-CM | POA: Diagnosis not present

## 2016-05-24 NOTE — Telephone Encounter (Signed)
PATIENT CALLED TO  LET DR. Tamala Julian KNOW THAT SHE IS VERY CONCERNED WITH HER WATERY STOOLS. SHE IS GOING NOW ABOUT 7 TO 8 TIMES A DAY. SHE DOES NOT WANT TO GET DEHYDRATED. DR. Jaynee Eagles (NEUROLOGIST) TOLD HER TO INCREASE HER DOSAGE OF LEVETIRACETAM  (KEPPRA) 1000 MG ON 05/15/16. SHE SAID WHEN SHE SAW DR. Tamala Julian LAST WEEK SHE TOLD HER TO CONTINUE TO TAKE IT. SHE WOULD LIKE TO KNOW WHAT SHE SHOULD DO? BEST PHONE [phone] (CELL) [facility]ICE [facility] [address] [place]

## 2016-05-24 NOTE — Telephone Encounter (Signed)
THIS MESSAGE IS FOR DR. Tamala Julian: PLEASE SEE PATIENT'S E-MAIL FROM Wednesday 05/23/16. SHE CALLED BACK TODAY TO GIVE SOME ADDITIONAL INFORMATION THAT SHE IS VERY CONCERNED ABOUT. (I JUST REALIZED THE E-MAIL SAID "NON-URGENT QUESTION" AND I WANT TO BE SURE DR. Tamala Julian SEES IT). BEST PHONE [phone] (CELL) [facility] IS [facility] [address] [place]

## 2016-05-25 ENCOUNTER — Encounter: Payer: Self-pay | Admitting: Neurology

## 2016-05-25 NOTE — Telephone Encounter (Signed)
My chart message addressed.

## 2016-05-28 ENCOUNTER — Encounter: Payer: Self-pay | Admitting: Neurology

## 2016-05-31 ENCOUNTER — Encounter: Payer: Self-pay | Admitting: Family Medicine

## 2016-05-31 DIAGNOSIS — M7732 Calcaneal spur, left foot: Principal | ICD-10-CM

## 2016-05-31 DIAGNOSIS — M7731 Calcaneal spur, right foot: Secondary | ICD-10-CM

## 2016-06-01 DIAGNOSIS — F4321 Adjustment disorder with depressed mood: Secondary | ICD-10-CM | POA: Diagnosis not present

## 2016-06-04 ENCOUNTER — Encounter: Payer: Self-pay | Admitting: Gastroenterology

## 2016-06-06 DIAGNOSIS — F4321 Adjustment disorder with depressed mood: Secondary | ICD-10-CM | POA: Diagnosis not present

## 2016-06-07 ENCOUNTER — Telehealth: Payer: Self-pay | Admitting: Gastroenterology

## 2016-06-07 ENCOUNTER — Other Ambulatory Visit: Payer: Self-pay

## 2016-06-07 MED ORDER — NA SULFATE-K SULFATE-MG SULF 17.5-3.13-1.6 GM/177ML PO SOLN: 1.0000 | Freq: Once | ORAL | 0 refills | Status: AC

## 2016-06-07 NOTE — Telephone Encounter (Signed)
Sup[facility] to [facility] [address].

## 2016-06-15 DIAGNOSIS — F4321 Adjustment disorder with depressed mood: Secondary | ICD-10-CM | POA: Diagnosis not present

## 2016-06-16 ENCOUNTER — Encounter: Payer: Self-pay | Admitting: Family Medicine

## 2016-06-18 ENCOUNTER — Encounter: Payer: Self-pay | Admitting: Gastroenterology

## 2016-06-18 ENCOUNTER — Ambulatory Visit (AMBULATORY_SURGERY_CENTER): Payer: BLUE CROSS/BLUE SHIELD | Admitting: Gastroenterology

## 2016-06-18 VITALS — BP 110/82 | HR 74 | Temp 98.0°F | Resp 14 | Ht 59.0 in | Wt 167.0 lb

## 2016-06-18 DIAGNOSIS — Z1212 Encounter for screening for malignant neoplasm of rectum: Secondary | ICD-10-CM

## 2016-06-18 DIAGNOSIS — Z1211 Encounter for screening for malignant neoplasm of colon: Secondary | ICD-10-CM | POA: Diagnosis not present

## 2016-06-18 MED ORDER — SODIUM CHLORIDE 0.9 % IV SOLN: 500.0000 mL | INTRAVENOUS | Status: DC

## 2016-06-18 NOTE — Patient Instructions (Signed)
Diverticulosis, hemorrhoids seen today. (handouts given) Repeat colonoscopy in 10 years. Resume current medications. Call us with any questions or concerns. Thank you!   YOU HAD AN ENDOSCOPIC PROCEDURE TODAY AT THE [facility]:   Refer to the procedure report that was given to you for any specific questions about what was found during the examination.  If the procedure report does not answer your questions, please call your gastroenterologist to clarify.  If you requested that your care partner not be given the details of your procedure findings, then the procedure report has been included in a sealed envelope for you to review at your convenience later.  YOU SHOULD EXPECT: Some feelings of bloating in the abdomen. Passage of more gas than usual.  Walking can help get rid of the air that was put into your GI tract during the procedure and reduce the bloating. If you had a lower endoscopy (such as a colonoscopy or flexible sigmoidoscopy) you may notice spotting of blood in your stool or on the toilet paper. If you underwent a bowel prep for your procedure, you may not have a normal bowel movement for a few days.  Please Note:  You might notice some irritation and congestion in your nose or some drainage.  This is from the oxygen used during your procedure.  There is no need for concern and it should clear up in a day or so.  SYMPTOMS TO REPORT IMMEDIATELY:   Following lower endoscopy (colonoscopy or flexible sigmoidoscopy):  Excessive amounts of blood in the stool  Significant tenderness or worsening of abdominal pains  Swelling of the abdomen that is new, acute  Fever of 100F or higher   For urgent or emergent issues, a gastroenterologist can be reached at any hour by calling [phone].   DIET:  We do recommend a small meal at first, but then you may proceed to your regular diet.  Drink plenty of fluids but you should avoid alcoholic beverages for 24 hours.  ACTIVITY:   You should plan to take it easy for the rest of today and you should NOT DRIVE or use heavy machinery until tomorrow (because of the sedation medicines used during the test).    FOLLOW UP: Our staff will call the number listed on your records the next business day following your procedure to check on you and address any questions or concerns that you may have regarding the information given to you following your procedure. If we do not reach you, we will leave a message.  However, if you are feeling well and you are not experiencing any problems, there is no need to return our call.  We will assume that you have returned to your regular daily activities without incident.  If any biopsies were taken you will be contacted by phone or by letter within the next 1-3 weeks.  Please call us at [phone] if you have not heard about the biopsies in 3 weeks.    SIGNATURES/CONFIDENTIALITY: You and/or your care partner have signed paperwork which will be entered into your electronic medical record.  These signatures attest to the fact that that the information above on your After Visit Summary has been reviewed and is understood.  Full responsibility of the confidentiality of this discharge information lies with you and/or your care-partner. 

## 2016-06-18 NOTE — Progress Notes (Signed)
A/ox3 pleased with MAC, report to Robbin RN 

## 2016-06-18 NOTE — Op Note (Signed)
[place] Patient Name: Rebekah [name] Procedure Date: 06/18/2016 9:17 AM MRN: [id number] Endoscopist: Remo Lipps P. Armbruster MD, MD Age: 61 Referring MD:  Date of Birth: 03/25/1955 Gender: Female Account #: [id number] Procedure:                Colonoscopy Indications:              Colon cancer screening in patient at increased                            risk: Family history of colorectal cancer in                            multiple 2nd degree relatives, This is the                            patient's first colonoscopy Medicines:                Monitored Anesthesia Care Procedure:                Pre-Anesthesia Assessment:                           - Prior to the procedure, a History and Physical                            was performed, and patient medications and                            allergies were reviewed. The patient's tolerance of                            previous anesthesia was also reviewed. The risks                            and benefits of the procedure and the sedation                            options and risks were discussed with the patient.                            All questions were answered, and informed consent                            was obtained. Prior Anticoagulants: The patient has                            taken no previous anticoagulant or antiplatelet                            agents. ASA Grade Assessment: II - A patient with                            mild systemic disease. After reviewing the risks  and benefits, the patient was deemed in                            satisfactory condition to undergo the procedure.                           After obtaining informed consent, the colonoscope                            was passed under direct vision. Throughout the                            procedure, the patient's blood pressure, pulse, and                            oxygen saturations were monitored  continuously. The                            Model PCF-H190DL [id number]) scope was introduced                            through the anus and advanced to the the cecum,                            identified by appendiceal orifice and ileocecal                            valve. The colonoscopy was performed without                            difficulty. The patient tolerated the procedure                            well. The quality of the bowel preparation was                            adequate. The ileocecal valve, appendiceal orifice,                            and rectum were photographed. Scope In: 9:26:03 AM Scope Out: 9:43:59 AM Scope Withdrawal Time: 0 hours 14 minutes 6 seconds  Total Procedure Duration: 0 hours 17 minutes 56 seconds  Findings:                 The perianal and digital rectal examinations were                            normal.                           Multiple medium-mouthed diverticula were found in                            the left colon.  Internal hemorrhoids were found during                            retroflexion. The hemorrhoids were small.                           The exam was otherwise without abnormality. No                            polyps. Complications:            No immediate complications. Estimated blood loss:                            None. Estimated Blood Loss:     Estimated blood loss: none. Impression:               - Diverticulosis in the left colon.                           - Internal hemorrhoids.                           - The examination was otherwise normal.                           - No specimens collected. Recommendation:           - Patient has a contact number available for                            emergencies. The signs and symptoms of potential                            delayed complications were discussed with the                            patient. Return to normal activities tomorrow.                             Written discharge instructions were provided to the                            patient.                           - Resume previous diet.                           - Continue present medications.                           - Repeat colonoscopy in 10 years for screening                            purposes. Remo Lipps P. Armbruster MD, MD 06/18/2016 9:47:42 AM This report has been signed electronically.

## 2016-06-19 ENCOUNTER — Telehealth: Payer: Self-pay | Admitting: *Deleted

## 2016-06-19 NOTE — Telephone Encounter (Signed)
No answer, message left for the patient. 

## 2016-06-19 NOTE — Telephone Encounter (Signed)
  Follow up Call-  Call back number 06/18/2016  Post procedure Call Back phone  # [phone]  Permission to leave phone message Yes     Patient questions:  Do you have a fever, pain , or abdominal swelling? No. Pain Score  0 *  Have you tolerated food without any problems? Yes.    Have you been able to return to your normal activities? Yes.    Do you have any questions about your discharge instructions: Diet   No. Medications  No. Follow up visit  No.  Do you have questions or concerns about your Care? No.  Actions: * If pain score is 4 or above: No action needed, pain <4.

## 2016-06-28 ENCOUNTER — Encounter: Payer: Self-pay | Admitting: Neurology

## 2016-06-28 DIAGNOSIS — F445 Conversion disorder with seizures or convulsions: Secondary | ICD-10-CM

## 2016-06-29 NOTE — Telephone Encounter (Signed)
30 Day Event Monitor  Quality: Fair.  Baseline artifact. Predominant rhythm: sinus rhythm No arrhythmias noted  Tiffany C. Oval Linsey, MD, [facility] 02/22/2016  Pt also had normal prolonged ambulatory 72 hr video EEG in Nov 2017.

## 2016-06-30 NOTE — Addendum Note (Signed)
Addended by: Monte Fantasia on: 06/30/2016 04:58 PM   Modules accepted: Orders

## 2016-07-02 ENCOUNTER — Ambulatory Visit: Payer: BLUE CROSS/BLUE SHIELD

## 2016-07-02 ENCOUNTER — Encounter: Payer: Self-pay | Admitting: Podiatry

## 2016-07-02 ENCOUNTER — Ambulatory Visit (INDEPENDENT_AMBULATORY_CARE_PROVIDER_SITE_OTHER): Payer: BLUE CROSS/BLUE SHIELD

## 2016-07-02 ENCOUNTER — Ambulatory Visit (INDEPENDENT_AMBULATORY_CARE_PROVIDER_SITE_OTHER): Payer: BLUE CROSS/BLUE SHIELD | Admitting: Podiatry

## 2016-07-02 DIAGNOSIS — M722 Plantar fascial fibromatosis: Secondary | ICD-10-CM

## 2016-07-02 DIAGNOSIS — F4321 Adjustment disorder with depressed mood: Secondary | ICD-10-CM | POA: Diagnosis not present

## 2016-07-02 MED ORDER — TRIAMCINOLONE ACETONIDE 10 MG/ML IJ SUSP
10.0000 mg | Freq: Once | INTRAMUSCULAR | Status: AC
  Administered 2016-07-02: 10 mg

## 2016-07-02 NOTE — Progress Notes (Signed)
   Subjective:    Patient ID: Rebekah [name], female    DOB: [birth year], 61 y.o.   MRN: [id number]  HPI  Chief Complaint  Patient presents with  . Foot Pain    BL; bottom of heel x "more than 10 years"        Review of Systems     Objective:   Physical Exam        Assessment & Plan:

## 2016-07-04 NOTE — Progress Notes (Signed)
Subjective:    Patient ID: Rebekah [name], female   DOB: 61 y.o.   MRN: [id number]   HPI patient presents stating she's had on and off problems with her heels for around 10 years and her left heel has become intensely discomforting over the last approximate 6 months. She's also had intense discomfort at time with the right heel    Review of Systems  All other systems reviewed and are negative.       Objective:  Physical Exam  Cardiovascular: Intact distal pulses.   Musculoskeletal: Normal range of motion.  Neurological: She is alert.  Skin: Skin is warm.  Nursing note and vitals reviewed.  neurovascular status intact muscle strength adequate range of motion within normal limits with patient found to have exquisite discomfort plantar aspect heel left over right with inflammation fluid around the medial band and moderate depression of the arch also noted. Patient's found to have good digital perfusion well oriented 3     Assessment:    Acute plantar fasciitis left over right heel with inflammation fluid around the medial band     Plan:    H&P condition reviewed and today injected the plantar fascial left 3 mg Kenalog 5 mg Xylocaine and applied fascial brace with instructions on usage and gave instructions on physical therapy and shoe gear modifications  X-rays indicate small spur with no indication of stress fracture

## 2016-07-16 ENCOUNTER — Encounter: Payer: Self-pay | Admitting: Family Medicine

## 2016-07-23 ENCOUNTER — Encounter: Payer: Self-pay | Admitting: Podiatry

## 2016-07-23 ENCOUNTER — Ambulatory Visit (INDEPENDENT_AMBULATORY_CARE_PROVIDER_SITE_OTHER): Payer: BLUE CROSS/BLUE SHIELD | Admitting: Podiatry

## 2016-07-23 DIAGNOSIS — F4321 Adjustment disorder with depressed mood: Secondary | ICD-10-CM | POA: Diagnosis not present

## 2016-07-23 DIAGNOSIS — M722 Plantar fascial fibromatosis: Secondary | ICD-10-CM | POA: Diagnosis not present

## 2016-07-23 MED ORDER — TRIAMCINOLONE ACETONIDE 10 MG/ML IJ SUSP
10.0000 mg | Freq: Once | INTRAMUSCULAR | Status: AC
Start: 2016-07-23 — End: 2016-07-23
  Administered 2016-07-23: 10 mg

## 2016-07-24 NOTE — Progress Notes (Signed)
Subjective:    Patient ID: Rebekah [name], female   DOB: 61 y.o.   MRN: [id number]   HPI patient states her heel is still bothering her quite a bit with discomfort that's worse when she gets up in the morning and after periods of sitting    ROS      Objective:  Physical Exam neurovascular status intact with continued inflammation pain of the plantar fascial left at the insertional point tendon into the calcaneus     Assessment:    Plantar fasciitis still noted left heel at the insertional point tendon calcaneus     Plan:    H&P condition reviewed and today I reinjected the plantar fascial left 3 [place] Kenalog 5 mg Xylocaine and dispensed night splint with all instructions on usage. Also instructed on heat ice therapy and proper wait to do this

## 2016-07-27 ENCOUNTER — Encounter: Payer: Self-pay | Admitting: Family Medicine

## 2016-07-27 ENCOUNTER — Ambulatory Visit (INDEPENDENT_AMBULATORY_CARE_PROVIDER_SITE_OTHER): Payer: BLUE CROSS/BLUE SHIELD | Admitting: Family Medicine

## 2016-07-27 ENCOUNTER — Ambulatory Visit (INDEPENDENT_AMBULATORY_CARE_PROVIDER_SITE_OTHER): Payer: BLUE CROSS/BLUE SHIELD

## 2016-07-27 VITALS — BP 126/88 | HR 87 | Temp 98.4°F | Resp 18 | Ht 60.24 in | Wt 165.8 lb

## 2016-07-27 DIAGNOSIS — R202 Paresthesia of skin: Secondary | ICD-10-CM | POA: Diagnosis not present

## 2016-07-27 DIAGNOSIS — R3129 Other microscopic hematuria: Secondary | ICD-10-CM | POA: Diagnosis not present

## 2016-07-27 DIAGNOSIS — M79641 Pain in right hand: Secondary | ICD-10-CM | POA: Diagnosis not present

## 2016-07-27 LAB — POCT URINALYSIS DIP (MANUAL ENTRY)
Bilirubin, UA: NEGATIVE
Glucose, UA: NEGATIVE mg/dL
Ketones, POC UA: NEGATIVE mg/dL
LEUKOCYTES UA: NEGATIVE
Nitrite, UA: NEGATIVE
PH UA: 5.5 (ref 5.0–8.0)
PROTEIN UA: NEGATIVE mg/dL
SPEC GRAV UA: 1.025 (ref 1.010–1.025)
UROBILINOGEN UA: 0.2 U/dL

## 2016-07-27 LAB — POC MICROSCOPIC URINALYSIS (UMFC): Mucus: ABSENT

## 2016-07-27 MED ORDER — MELOXICAM 7.5 MG PO TABS: 7.5000 mg | ORAL_TABLET | Freq: Every day | ORAL | 2 refills | Status: DC | PRN

## 2016-07-27 NOTE — Patient Instructions (Addendum)
Start M ELOXICAM daily for two weeks. Start a MULTIVITAMIN one daily for the next three months. Wear wrist splints during the day for the next two weeks. If no improvement in one month, recommend seeing an orthopedist for further evaluation.  Cubital Tunnel Syndrome Cubital tunnel syndrome is a condition that causes pain and weakness of the forearm and hand. This condition happens when one of the nerves (ulnar nerve) that runs alongside the elbow joint becomes irritated. What are the causes? Causes of this condition include:  Increased pressure on the ulnar nerve at the elbow, arm, or forearm. This can be caused by: ? Swollen tissues. ? Ligaments. ? Muscles. ? Poorly healed elbow fractures. ? Tumors in the elbow. These are usually noncancerous (benign). ? Scar tissue that develops in the elbow after an injury. ? Bony growths (spurs) near the ulnar nerve.  Stretching of the nerve due to loose elbow ligaments.  Trauma to the nerve at the elbow.  Repetitive elbow bending.  Certain medical conditions.  What increases the risk? This condition is more likely to develop in:  People who do manual labor that requires frequently bending the elbow.  People who play sports that include repeated or strenuous throwing motions, such as baseball.  People who play contact sports, such as football or lacrosse.  People who do not warm up properly before activities.  People who have diabetes.  People who have an underactive thyroid (hypothyroidism).  What are the signs or symptoms? Symptoms of this condition include:  Clumsiness or weakness of the hand.  Tenderness of the inner elbow.  Aching or soreness of the inner elbow, forearm, or fingers, especially the little finger or the ring finger.  Increased pain with forced elbow bending.  Reduced control when throwing.  Tingling, numbness, or burning inside the forearm, or in part of the hand or fingers, especially the little finger  or the ring finger.  Sharp pains that shoot from the elbow down to the wrist and hand.  The inability to grip or pinch hard.  How is this diagnosed? This condition is diagnosed with a medical history and physical exam. Your health care provider will ask about your symptoms and ask for details about any injury. You may also have other tests, including:  Electromyogram (EMG). This test checks how well the nerve is working.  X-ray.  How is this treated? Treatment starts by stopping the activities that are causing your symptoms to get worse. Treatment may include the use of icing and medicines to reduce pain and swelling. You may also be advised to wear a splint to prevent your elbow from bending or wear an elbow pad where the ulnar nerve is closest to the skin. In less severe cases, treatment may also include working with a physical therapist:  To help decrease your symptoms.  To improve the strength and range of motion of your elbow, forearm, and hand.  If the treatments described above do not help, surgery may be needed. Follow these instructions at home: If you have a splint:  Wear it as told by your health care provider. Remove it only as told by your health care provider.  Loosen the splint if your fingers become numb and tingle, or if they turn cold and blue.  Keep the splint clean and dry. Managing pain, stiffness, and swelling  If directed, apply ice to the injured area: ? Put ice in a plastic bag. ? Place a towel between your skin and the bag. ? Leave  the ice on for 20 minutes, 2-3 times per day.  Move your fingers often to avoid stiffness and to lessen swelling.  Raise (elevate) the injured area above the level of your heart while you are sitting or lying down. General instructions  Take over-the-counter and prescription medicines only as told by your health care provider.  Keep all follow-up visits as told by your health care provider. This is important.  Do any  exercise or physical therapy as told by your health care provider.  Do not drive or operate heavy machinery while taking prescription pain medicine.  If you were given an elbow pad, wear it as told by your health care provider. Contact a health care provider if:  Your symptoms get worse.  Your symptoms do not get better with treatment.  Your have new pain.  Your hand on the injured side feels numb or cold. This information is not intended to replace advice given to you by your health care provider. Make sure you discuss any questions you have with your health care provider. Document Released: 01/01/2005 Document Revised: 06/09/2015 Document Reviewed: 03/10/2014 Elsevier Interactive Patient Education  2018 [employer].     IF you received an x-ray today, you will receive an invoice from [facility]. Please contact [facility] at [phone] with questions or concerns regarding your invoice.   IF you received labwork today, you will receive an invoice from [place]. Please contact [facility] at [phone] with questions or concerns regarding your invoice.   Our billing staff will not be able to assist you with questions regarding bills from these companies.  You will be contacted with the lab results as soon as they are available. The fastest way to get your results is to activate your My Chart account. Instructions are located on the last page of this paperwork. If you have not heard from [place] regarding the results in 2 weeks, please contact this office.

## 2016-07-27 NOTE — Progress Notes (Signed)
Subjective:    Patient ID: Rebekah [name], female    DOB: 11/11/1955, 61 y.o.   MRN: [id number]  07/27/2016  Numbness (tingling and numbness in both hands)   HPI This 61 y.o. female presents for evaluation of numbness and tingling in both hands.  Numbness and tingling started two weeks ago.  Worried about Guillian Burre syndrome.  Applying for SSI; has appointment next month.   L hand numbness and tingling.  Having palm pain on R hand.  Has had surgery on both hands twice for carpal tunnel syndrome; surgeries  before 2005.  Crocheting little stuff.  Watch the grandchild; two months old; watching every day.  Staying home with granddaughter.  Does not hold baby too much.  Has R sided neck spasm; has chronic neck pain; admits to chronic neck pain. Neurologist is sending to another physician in [place]/Dr. Ernest Haber of neurology; had two seizures on 07/28/16 but different kind; occurred at night while holding granddaughter.  Watching television a lot during the day.  Does laundry and cleaning home.  R hand dominant.  Continues to talk with husband who moved to [place] with his daughter; still loves husband; pt is convinced he will eventually move back to [place] to be with pt.  BP Readings from Last 3 Encounters:  07/27/16 126/88  06/18/16 110/82  05/19/16 115/79   Wt Readings from Last 3 Encounters:  07/27/16 165 lb 12.8 oz (75.2 kg)  06/18/16 167 lb (75.8 kg)  05/19/16 167 lb (75.8 kg)    Review of Systems  Constitutional: Negative for chills, diaphoresis, fatigue and fever.  Eyes: Negative for visual disturbance.  Respiratory: Negative for cough and shortness of breath.   Cardiovascular: Negative for chest pain, palpitations and leg swelling.  Gastrointestinal: Negative for abdominal pain, constipation, diarrhea, nausea and vomiting.  Endocrine: Negative for cold intolerance, heat intolerance, polydipsia, polyphagia and polyuria.  Musculoskeletal: Positive for arthralgias,  myalgias, neck pain and neck stiffness.  Neurological: Positive for numbness. Negative for dizziness, tremors, seizures, syncope, facial asymmetry, speech difficulty, weakness, light-headedness and headaches.  Psychiatric/Behavioral: Positive for dysphoric mood. Negative for self-injury and sleep disturbance. The patient is nervous/anxious.     Past Medical History:  Diagnosis Date  . Anxiety   . Arthritis   . Asthma   . Depression   . Elevated blood pressure reading 01/09/2016  . Fibromyalgia   . Seizure disorder ([place])   . Seizures ([place])    Past Surgical History:  Procedure Laterality Date  . BREAST BIOPSY Right    biopsy in the 1990's  . BUNIONECTOMY Bilateral    2000's - R twice and L once  . CARPAL TUNNEL RELEASE Bilateral    2000's  . TUBAL LIGATION  1984   Allergies  Allergen Reactions  . Ambien [Zolpidem Tartrate] Other (See Comments)    Other reaction(s): Hallucination  . Soma [Carisoprodol] Other (See Comments)    chills    Social History   Social History  . Marital status: Married    Spouse name: N/A  . Number of children: 4  . 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 Smoking status: Never Smoker  . Smokeless tobacco: Never Used  . Alcohol use No  . Drug use: No  . Sexual activity: Not on file   Other Topics Concern  . Not on file   Social History Narrative   Lives with daughter, [name]  Caffeine use: coffee/tea/soda daily   Family History  Problem Relation Age of Onset  . Breast cancer Mother   . Diabetes Brother   . Hypertension Brother   . Stroke Brother   . Breast cancer Maternal Grandmother   . Breast cancer Maternal Aunt        x 3  . Colon cancer Paternal Aunt        x 2  . Colon cancer Paternal Uncle        Objective:    BP 126/88   Pulse 87   Temp 98.4 F (36.9 C) (Oral)   Resp 18   Ht 5' 0.24" (1.53 m)   Wt 165 lb 12.8 oz (75.2 kg)   SpO2 98%   BMI 32.13  kg/m  Physical Exam  Constitutional: She is oriented to person, place, and time. She appears well-developed and well-nourished. No distress.  HENT:  Head: Normocephalic and atraumatic.  Right Ear: External ear normal.  Left Ear: External ear normal.  Nose: Nose normal.  Mouth/Throat: Oropharynx is clear and moist.  Eyes: Pupils are equal, round, and reactive to light. Conjunctivae and EOM are normal.  Neck: Normal range of motion. Neck supple. Carotid bruit is not present. No thyromegaly present.  Cardiovascular: Normal rate, regular rhythm, normal heart sounds and intact distal pulses.  Exam reveals no gallop and no friction rub.   No murmur heard. Pulmonary/Chest: Effort normal and breath sounds normal. She has no wheezes. She has no rales.  Abdominal: Soft. Bowel sounds are normal. She exhibits no distension and no mass. There is no tenderness. There is no rebound and no guarding.  Musculoskeletal:       Right elbow: Normal.      Left elbow: Normal.       Right wrist: Normal.       Left wrist: Normal.       Cervical back: She exhibits pain. She exhibits normal range of motion, no tenderness, no bony tenderness, no spasm and normal pulse.       Right hand: She exhibits decreased range of motion and tenderness. Normal sensation noted. Normal strength noted.       Left hand: She exhibits tenderness. She exhibits normal range of motion.  Lymphadenopathy:    She has no cervical adenopathy.  Neurological: She is alert and oriented to person, place, and time. No cranial nerve deficit.  Skin: Skin is warm and dry. No rash noted. She is not diaphoretic. No erythema. No pallor.  Psychiatric: She has a normal mood and affect. Her behavior is normal.   Dg Hand Complete Right  Result Date: 07/27/2016 CLINICAL DATA:  Right hand pain without trauma. EXAM: RIGHT HAND - COMPLETE 3+ VIEW COMPARISON:  09/21/2015 FINDINGS: No acute fracture or dislocation. Suspect minimal osteoarthritis, including  osteophyte formation about the distal interphalangeal joints of the third and fourth digits. No periarticular erosions. IMPRESSION: No acute osseous abnormality. Electronically Signed   By: Abigail Miyamoto M.D.   On: 07/27/2016 09:40   Depression screen [facility] 2/9 07/27/2016 05/19/2016 04/18/2016 01/25/2016 12/02/2015  Decreased Interest 0 2 3 0 2  Down, Depressed, Hopeless 0 3 3 0 2  PHQ - 2 Score 0 5 6 0 4  Altered sleeping - 2 3 - 2  Tired, decreased energy - 3 3 - 2  Change in appetite - 3 3 - 2  Feeling bad or failure about yourself  - 3 3 - 2  Trouble concentrating - 0 3 - 2  Moving slowly or fidgety/restless - 1 1 - 2  Suicidal thoughts - 0 0 - 0  PHQ-9 Score - 17 22 - 16  Difficult doing work/chores - Not difficult at all Extremely dIfficult - Somewhat difficult  Some recent data might be hidden        Assessment & Plan:   1. Right hand pain   2. Paresthesias   3. Hematuria, microscopic    -new onset R hand pain with associated paresthesias.  History of B carpal tunnel release yet symptoms different; excessive use of hands during the day with knitting and caring for infant grandchild.  Recommend rest.  -rx for Mobic provided to take daily.  -refer to ortho for further evaluation.  -microscopic hematuria present at last visit; repeat today.  that Dr. Lorelei Pont referred to urology in 2016 for microscopic hematuria.  S/p CT urology that revealed being renal cyst and pt also underwent cystoscopy that was negative.  No further work up warranted at this time.   Orders Placed This Encounter  Procedures  . DG Hand Complete Right    Standing Status:   Future    Number of Occurrences:   1    Standing Expiration Date:   07/27/2017    Order Specific Question:   Reason for Exam (SYMPTOM  OR DIAGNOSIS REQUIRED)    Answer:   R hand pain; no trauma    Order Specific Question:   Is the patient pregnant?    Answer:   No    Order Specific Question:   Preferred imaging location?    Answer:   External   . AMB referral to orthopedics    Referral Priority:   Routine    Referral Type:   Consultation    Number of Visits Requested:   1  . POCT urinalysis dipstick  . POCT Microscopic Urinalysis ([facility])   Meds ordered this encounter  Medications  . meloxicam (MOBIC) 7.5 MG tablet    Sig: Take 1 tablet (7.5 mg total) by mouth daily as needed.    Dispense:  30 tablet    Refill:  2    No Follow-up on file.   Chritopher Coster Elayne Guerin, M.D. Primary Care at [facility] previously Ur[facility] [place] [address] [place], [place]  [address] [phone] phone [phone] fax

## 2016-07-28 ENCOUNTER — Encounter: Payer: Self-pay | Admitting: Neurology

## 2016-08-15 ENCOUNTER — Encounter: Payer: Self-pay | Admitting: Family Medicine

## 2016-08-15 DIAGNOSIS — M5412 Radiculopathy, cervical region: Secondary | ICD-10-CM | POA: Diagnosis not present

## 2016-08-22 ENCOUNTER — Encounter: Payer: Self-pay | Admitting: Family Medicine

## 2016-08-22 ENCOUNTER — Ambulatory Visit (INDEPENDENT_AMBULATORY_CARE_PROVIDER_SITE_OTHER): Payer: BLUE CROSS/BLUE SHIELD | Admitting: Family Medicine

## 2016-08-22 VITALS — BP 116/85 | HR 90 | Temp 98.0°F | Resp 18 | Ht 59.84 in | Wt 164.0 lb

## 2016-08-22 DIAGNOSIS — M797 Fibromyalgia: Secondary | ICD-10-CM | POA: Diagnosis not present

## 2016-08-22 DIAGNOSIS — F445 Conversion disorder with seizures or convulsions: Secondary | ICD-10-CM

## 2016-08-22 DIAGNOSIS — F418 Other specified anxiety disorders: Secondary | ICD-10-CM

## 2016-08-22 DIAGNOSIS — R030 Elevated blood-pressure reading, without diagnosis of hypertension: Secondary | ICD-10-CM

## 2016-08-22 DIAGNOSIS — R3129 Other microscopic hematuria: Secondary | ICD-10-CM

## 2016-08-22 DIAGNOSIS — Z62819 Personal history of unspecified abuse in childhood: Secondary | ICD-10-CM | POA: Diagnosis not present

## 2016-08-22 DIAGNOSIS — K591 Functional diarrhea: Secondary | ICD-10-CM

## 2016-08-22 DIAGNOSIS — M79673 Pain in unspecified foot: Secondary | ICD-10-CM

## 2016-08-22 DIAGNOSIS — M542 Cervicalgia: Secondary | ICD-10-CM | POA: Diagnosis not present

## 2016-08-22 NOTE — Patient Instructions (Addendum)
   Recommend exercising on ELLIPTICAL three days per week.    IF you received an x-ray today, you will receive an invoice from [facility]. Please contact [facility] at [phone] with questions or concerns regarding your invoice.   IF you received labwork today, you will receive an invoice from [place]. Please contact [facility] at [phone] with questions or concerns regarding your invoice.   Our billing staff will not be able to assist you with questions regarding bills from these companies.  You will be contacted with the lab results as soon as they are available. The fastest way to get your results is to activate your My Chart account. Instructions are located on the last page of this paperwork. If you have not heard from [place] regarding the results in 2 weeks, please contact this office.
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
## 2016-08-23 ENCOUNTER — Ambulatory Visit: Payer: BLUE CROSS/BLUE SHIELD | Admitting: Podiatry

## 2016-08-27 ENCOUNTER — Encounter: Payer: Self-pay | Admitting: Family Medicine

## 2016-08-27 DIAGNOSIS — M542 Cervicalgia: Secondary | ICD-10-CM | POA: Diagnosis not present

## 2016-08-29 DIAGNOSIS — G40219 Localization-related (focal) (partial) symptomatic epilepsy and epileptic syndromes with complex partial seizures, intractable, without status epilepticus: Secondary | ICD-10-CM | POA: Diagnosis not present

## 2016-09-10 ENCOUNTER — Encounter: Payer: Self-pay | Admitting: Family Medicine

## 2016-09-24 DIAGNOSIS — Z0271 Encounter for disability determination: Secondary | ICD-10-CM

## 2016-10-17 ENCOUNTER — Encounter: Payer: Self-pay | Admitting: Neurology

## 2016-10-18 ENCOUNTER — Encounter: Payer: Self-pay | Admitting: Family Medicine

## 2016-10-22 ENCOUNTER — Encounter: Payer: Self-pay | Admitting: Family Medicine

## 2016-10-26 ENCOUNTER — Encounter: Payer: Self-pay | Admitting: Family Medicine

## 2016-11-01 DIAGNOSIS — F4321 Adjustment disorder with depressed mood: Secondary | ICD-10-CM | POA: Diagnosis not present

## 2016-11-04 ENCOUNTER — Other Ambulatory Visit: Payer: Self-pay | Admitting: Family Medicine

## 2016-11-04 DIAGNOSIS — F330 Major depressive disorder, recurrent, mild: Secondary | ICD-10-CM

## 2016-11-04 DIAGNOSIS — M797 Fibromyalgia: Secondary | ICD-10-CM

## 2016-11-05 DIAGNOSIS — F4321 Adjustment disorder with depressed mood: Secondary | ICD-10-CM | POA: Diagnosis not present

## 2016-11-06 ENCOUNTER — Encounter: Payer: Self-pay | Admitting: Family Medicine

## 2016-11-07 ENCOUNTER — Encounter: Payer: Self-pay | Admitting: Family Medicine

## 2016-11-07 ENCOUNTER — Ambulatory Visit (INDEPENDENT_AMBULATORY_CARE_PROVIDER_SITE_OTHER): Payer: BLUE CROSS/BLUE SHIELD | Admitting: Family Medicine

## 2016-11-07 VITALS — BP 130/82 | HR 105 | Temp 98.0°F | Resp 16 | Ht 60.24 in | Wt 169.0 lb

## 2016-11-07 DIAGNOSIS — M797 Fibromyalgia: Secondary | ICD-10-CM

## 2016-11-07 DIAGNOSIS — G5603 Carpal tunnel syndrome, bilateral upper limbs: Secondary | ICD-10-CM | POA: Diagnosis not present

## 2016-11-07 DIAGNOSIS — R42 Dizziness and giddiness: Secondary | ICD-10-CM | POA: Diagnosis not present

## 2016-11-07 DIAGNOSIS — F330 Major depressive disorder, recurrent, mild: Secondary | ICD-10-CM | POA: Diagnosis not present

## 2016-11-07 DIAGNOSIS — R3129 Other microscopic hematuria: Secondary | ICD-10-CM | POA: Diagnosis not present

## 2016-11-07 DIAGNOSIS — Z23 Encounter for immunization: Secondary | ICD-10-CM

## 2016-11-07 MED ORDER — DULOXETINE HCL 60 MG PO CPEP: 60.0000 mg | ORAL_CAPSULE | Freq: Every day | ORAL | 1 refills | Status: DC

## 2016-11-07 MED ORDER — CITALOPRAM HYDROBROMIDE 20 MG PO TABS: 20.0000 mg | ORAL_TABLET | Freq: Every day | ORAL | 1 refills | Status: DC

## 2016-11-07 NOTE — Progress Notes (Signed)
Subjective:    Patient ID: Rebekah [name], female    DOB: 07/16/1955, 61 y.o.   MRN: [id number]  11/07/2016  Anxiety (x 2 months pt states her anxiety has been worst. Pt states it has been affecting her sleep and causing her fibromyalgia to act up. )    HPI This 61 y.o. female presents for evaluation of anxiety, dizziness, pseudoseizures, and fibromyalgia.  Anxiety is so high; anxiety is really building up.  Does not share with daughter because does not want to hurt daughter's feelings.  Two Sundays ago, entire back was really awful due to weather changes; every step pt took, felt a zap in face.  Unable to go shopping with daughter.  In fibromyalgia group, everyone suggesting that anxiety and zapping in face.  Irritable.  Taking two Cymbalta daily without benefit.  Really really tired; hurting in both arms, hands, and back.  Pain in hand waking up at night.  Needs new wrist splints.  Wants some that covers entire hand.  Palms and fingers hurting at night.  Has never popped fingers.     Only took two a day of 300mg  of Gabapentin prescribed; started by neurologist in [facility].Taking 800am and 800pm.   Worried about weight gain.  No hydroxyzine.  Afraid to gain weight with Gabapentin.  Has been talking with husband.  Has peace with that.  No crying any longer.  Can call anytime.  Plans to put on insurance in [place] and use it here.  Self esteem is better. Helped daughter with birthday this weekend.  Feeling better.  No SI.  Therapist one month ago. Daughter has returned back to work. Must work around [employer] schedule.   No previous medication for anxiety/depression.  Previous Zoloft; caused chills.  Took Lexapro years ago.    Did apply for disability.  Last seizure 10/22/16; wants to admit for five days to provoke a seizure.  Stopped Keppra for three weeks to see if would have seizure and did not.  Stopped it in July.  Restarted in August when saw neurologist in [facility].  Son in  law going to move in.  Intermittent dizziness but not today much.     BP Readings from Last 3 Encounters:  11/07/16 130/82  08/22/16 116/85  07/27/16 126/88   Wt Readings from Last 3 Encounters:  11/07/16 169 lb (76.7 kg)  08/22/16 164 lb (74.4 kg)  07/27/16 165 lb 12.8 oz (75.2 kg)   Immunization History  Administered Date(s) Administered  . Influenza,inj,Quad PF,6+ Mos 11/10/2014, 10/27/2015  . Pneumococcal Polysaccharide-23 04/18/2016  . Tdap 04/18/2016    Review of Systems  Constitutional: Positive for fatigue. Negative for chills, diaphoresis and fever.  Eyes: Negative for visual disturbance.  Respiratory: Negative for cough and shortness of breath.   Cardiovascular: Negative for chest pain, palpitations and leg swelling.  Gastrointestinal: Negative for abdominal pain, constipation, diarrhea, nausea and vomiting.  Endocrine: Negative for cold intolerance, heat intolerance, polydipsia, polyphagia and polyuria.  Musculoskeletal: Positive for arthralgias, back pain, neck pain and neck stiffness.  Neurological: Positive for dizziness. Negative for tremors, seizures, syncope, facial asymmetry, speech difficulty, weakness, light-headedness, numbness and headaches.  Psychiatric/Behavioral: Positive for dysphoric mood. Negative for sleep disturbance. The patient is nervous/anxious.     Past Medical History:  Diagnosis Date  . Anxiety   . Arthritis   . Asthma   . Depression   . Elevated blood pressure reading 01/09/2016  . Fibromyalgia   . Seizure disorder ([place])   .  Seizures ([place])    Past Surgical History:  Procedure Laterality Date  . BREAST BIOPSY Right    biopsy in the 1990's  . BUNIONECTOMY Bilateral    2000's - R twice and L once  . CARPAL TUNNEL RELEASE Bilateral    2000's  . TUBAL LIGATION  1984   Allergies  Allergen Reactions  . Ambien [Zolpidem Tartrate] Other (See Comments)    Other reaction(s): Hallucination  . Soma [Carisoprodol] Other (See Comments)      chills   Current Outpatient Prescriptions on File Prior to Visit  Medication Sig Dispense Refill  . levETIRAcetam (KEPPRA) 1000 MG tablet Take 1 tablet (1,000 mg total) by mouth 2 (two) times daily. 60 tablet 6  . meloxicam (MOBIC) 7.5 MG tablet Take 1 tablet (7.5 mg total) by mouth daily as needed. 30 tablet 2  . Vitamin D, Ergocalciferol, (DRISDOL) 50000 units CAPS capsule TAKE 1 CAPSULE (50,000 UNITS TOTAL) BY MOUTH EVERY 7 (SEVEN) DAYS. 4 capsule 2   Current Facility-Administered Medications on File Prior to Visit  Medication Dose Route Frequency Provider Last Rate Last Dose  . 0.9 %  sodium chloride infusion  500 mL Intravenous Continuous Armbruster, Carlota Raspberry, MD       Social History   Social History  . Marital status: Married    Spouse name: N/A  . Number of children: 4  . 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 Smoking status: Never Smoker  . Smokeless tobacco: Never Used  . Alcohol use No  . Drug use: No  . Sexual activity: Not on file   Other Topics Concern  . Not on file   Social History Narrative   Lives with daughter, [name]   Caffeine use: coffee/tea/soda daily   Family History  Problem Relation Age of Onset  . Breast cancer Mother   . Diabetes Brother   . Hypertension Brother   . Stroke Brother   . Breast cancer Maternal Grandmother   . Breast cancer Maternal Aunt        x 3  . Colon cancer Paternal Aunt        x 2  . Colon cancer Paternal Uncle        Objective:    BP 130/82   Pulse (!) 105   Temp 98 F (36.7 C) (Oral)   Resp 16   Ht 5' 0.24" (1.53 m)   Wt 169 lb (76.7 kg)   SpO2 95%   BMI 32.75 kg/m  Physical Exam  Constitutional: She is oriented to person, place, and time. She appears well-developed and well-nourished. No distress.  HENT:  Head: Normocephalic and atraumatic.  Right Ear: External ear normal.  Left Ear: External ear normal.  Nose: Nose normal.   Mouth/Throat: Oropharynx is clear and moist.  Eyes: Pupils are equal, round, and reactive to light. Conjunctivae and EOM are normal.  Neck: Normal range of motion. Neck supple. Carotid bruit is not present. No thyromegaly present.  Cardiovascular: Normal rate, regular rhythm, normal heart sounds and intact distal pulses.  Exam reveals no gallop and no friction rub.   No murmur heard. Pulmonary/Chest: Effort normal and breath sounds normal. She has no wheezes. She has no rales.  Abdominal: Soft. Bowel sounds are normal. She exhibits no distension and no mass. There is no tenderness. There is no rebound and no guarding.  Musculoskeletal:       Right shoulder:  Normal.       Left shoulder: Normal.       Right wrist: Normal.       Left wrist: Normal.       Cervical back: Normal.  Lymphadenopathy:    She has no cervical adenopathy.  Neurological: She is alert and oriented to person, place, and time. No cranial nerve deficit. She exhibits normal muscle tone. Coordination normal.  Skin: Skin is warm and dry. No rash noted. She is not diaphoretic. No erythema. No pallor.  Psychiatric: She has a normal mood and affect. Her behavior is normal. Judgment and thought content normal.   No results found. Depression screen [facility] 2/9 11/07/2016 08/22/2016 07/27/2016 05/19/2016 04/18/2016  Decreased Interest 0 1 0 2 3  Down, Depressed, Hopeless 0 1 0 3 3  PHQ - 2 Score 0 2 0 5 6  Altered sleeping - 1 - 2 3  Tired, decreased energy - 1 - 3 3  Change in appetite - 0 - 3 3  Feeling bad or failure about yourself  - 1 - 3 3  Trouble concentrating - - - 0 3  Moving slowly or fidgety/restless - 0 - 1 1  Suicidal thoughts - 0 - 0 0  PHQ-9 Score - 5 - 17 22  Difficult doing work/chores - - - Not difficult at all Extremely dIfficult  Some recent data might be hidden   Fall Risk  11/07/2016 08/22/2016 07/27/2016 05/19/2016 04/18/2016  Falls in the past year? No No No Yes No  Number falls in past yr: - - - 1 -         Assessment & Plan:   1. Fibromyalgia   2. Mild episode of recurrent major depressive disorder ([place])   3. Need for prophylactic vaccination and inoculation against influenza   4. Dizziness   5. Bilateral carpal tunnel syndrome   6. Microscopic hematuria    -worsening fibromyalgia with worsening anxiety; decrease Cymbalta to 60mg  daily; add Citalopram 20mg  daily.   -increase Gabapentin to 300mg  tid for fibromyalgia and anxiety.  -obtain labs due to recent dizziness and worsening anxiety and fibromyalgia.  -worsening B carpal tunnel syndrome; B wrist splints provided during visit; increase Gabapentin. -start MVI daily.   Orders Placed This Encounter  Procedures  . Flu Vaccine QUAD 36+ mos IM  . CBC with Differential/Platelet  . Comprehensive metabolic panel  . TSH  . Vitamin B12  . VITAMIN D 25 Hydroxy (Vit-D Deficiency, Fractures)  . Urine Microscopic  . Splint wrist    velcro style RIGHT  . Splint wrist    velcro style LEFT  . POCT urinalysis dipstick   Meds ordered this encounter  Medications  . citalopram (CELEXA) 20 MG tablet    Sig: Take 1 tablet (20 mg total) by mouth daily.    Dispense:  90 tablet    Refill:  1  . DULoxetine (CYMBALTA) 60 MG capsule    Sig: Take 1 capsule (60 mg total) by mouth daily.    Dispense:  60 capsule    Refill:  1    Please consider 90 day supplies to promote better adherence  . gabapentin (NEURONTIN) 300 MG capsule    Sig: 1 tab by mouth at bedtime for 1 week, then 1 tab twice a day for 1 week, then 1 tab three times a day    No Follow-up on file.   Shontelle Muska Elayne Guerin, M.D. Primary Care at [facility] previously [facility] [address] [place], [place]  [address] [phone] phone [phone] fax

## 2016-11-07 NOTE — Patient Instructions (Addendum)
  Decrease to Cymbalta 60mg  one every morning Start Citalopram 20mg  one at bedtime for depression/anxiety. Increase Gabapentin to 2 at bedtime. Continue sleep medication at bedtime. Please start a multivitamin.   IF you received an x-ray today, you will receive an invoice from [facility]. Please contact [facility] at [phone] with questions or concerns regarding your invoice.   IF you received labwork today, you will receive an invoice from [facility]. Please contact [facility] at [phone] with questions or concerns regarding your invoice.   Our billing staff will not be able to assist you with questions regarding bills from these companies.  You will be contacted with the lab results as soon as they are available. The fastest way to get your results is to activate your My Chart account. Instructions are located on the last page of this paperwork. If you have not heard from [place] regarding the results in 2 weeks, please contact this office.

## 2016-11-08 ENCOUNTER — Ambulatory Visit: Payer: BLUE CROSS/BLUE SHIELD | Admitting: Adult Health

## 2016-11-08 ENCOUNTER — Ambulatory Visit: Payer: BLUE CROSS/BLUE SHIELD | Admitting: Podiatry

## 2016-11-08 ENCOUNTER — Encounter: Payer: Self-pay | Admitting: Family Medicine

## 2016-11-09 LAB — CBC WITH DIFFERENTIAL/PLATELET
BASOS ABS: 0 10*3/uL (ref 0.0–0.2)
Basos: 0 %
EOS (ABSOLUTE): 0.1 10*3/uL (ref 0.0–0.4)
Eos: 2 %
Hematocrit: 43 % (ref 34.0–46.6)
Hemoglobin: 14.4 g/dL (ref 11.1–15.9)
Immature Grans (Abs): 0 10*3/uL (ref 0.0–0.1)
Immature Granulocytes: 0 %
LYMPHS ABS: 2 10*3/uL (ref 0.7–3.1)
Lymphs: 35 %
MCH: 32.3 pg (ref 26.6–33.0)
MCHC: 33.5 g/dL (ref 31.5–35.7)
MCV: 96 fL (ref 79–97)
Monocytes Absolute: 0.6 10*3/uL (ref 0.1–0.9)
Monocytes: 10 %
NEUTROS ABS: 3 10*3/uL (ref 1.4–7.0)
Neutrophils: 53 %
PLATELETS: 309 10*3/uL (ref 150–379)
RBC: 4.46 x10E6/uL (ref 3.77–5.28)
RDW: 13.7 % (ref 12.3–15.4)
WBC: 5.7 10*3/uL (ref 3.4–10.8)

## 2016-11-09 LAB — COMPREHENSIVE METABOLIC PANEL
A/G RATIO: 1.9 (ref 1.2–2.2)
ALBUMIN: 4.4 g/dL (ref 3.5–5.5)
ALT: 19 IU/L (ref 0–32)
AST: 24 IU/L (ref 0–40)
Alkaline Phosphatase: 79 IU/L (ref 39–117)
BILIRUBIN TOTAL: 0.3 mg/dL (ref 0.0–1.2)
BUN / CREAT RATIO: 16 (ref 9–23)
BUN: 11 mg/dL (ref 6–24)
CHLORIDE: 102 mmol/L (ref 96–106)
CO2: 23 mmol/L (ref 20–29)
Calcium: 9 mg/dL (ref 8.7–10.2)
Creatinine, Ser: 0.68 mg/dL (ref 0.57–1.00)
GFR calc non Af Amer: 97 mL/min/{1.73_m2} (ref >59)
GFR, EST AFRICAN AMERICAN: 112 mL/min/{1.73_m2} (ref >59)
GLUCOSE: 88 mg/dL (ref 65–99)
Globulin, Total: 2.3 g/dL (ref 1.5–4.5)
POTASSIUM: 4.1 mmol/L (ref 3.5–5.2)
Sodium: 140 mmol/L (ref 134–144)
TOTAL PROTEIN: 6.7 g/dL (ref 6.0–8.5)

## 2016-11-09 LAB — URINALYSIS, MICROSCOPIC ONLY
Bacteria, UA: NONE SEEN
CASTS: NONE SEEN /LPF

## 2016-11-09 LAB — TSH: TSH: 1.03 u[IU]/mL (ref 0.450–4.500)

## 2016-11-09 LAB — VITAMIN D 25 HYDROXY (VIT D DEFICIENCY, FRACTURES): Vit D, 25-Hydroxy: 35.4 ng/mL (ref 30.0–100.0)

## 2016-11-09 LAB — VITAMIN B12: Vitamin B-12: 228 pg/mL — ABNORMAL LOW (ref 232–1245)

## 2016-11-12 DIAGNOSIS — M722 Plantar fascial fibromatosis: Secondary | ICD-10-CM | POA: Diagnosis not present

## 2016-11-16 DIAGNOSIS — F4321 Adjustment disorder with depressed mood: Secondary | ICD-10-CM | POA: Diagnosis not present

## 2016-11-19 ENCOUNTER — Ambulatory Visit: Payer: BLUE CROSS/BLUE SHIELD | Admitting: Podiatry

## 2016-11-19 ENCOUNTER — Encounter: Payer: Self-pay | Admitting: Podiatry

## 2016-11-19 DIAGNOSIS — M722 Plantar fascial fibromatosis: Secondary | ICD-10-CM | POA: Diagnosis not present

## 2016-11-19 MED ORDER — TRIAMCINOLONE ACETONIDE 10 MG/ML IJ SUSP: 10.0000 mg | Freq: Once | INTRAMUSCULAR | Status: DC

## 2016-11-21 NOTE — Progress Notes (Signed)
Subjective:    Patient ID: Rebekah [name], female   DOB: 61 y.o.   MRN: [id number]   HPI patient presents stating I'm still getting pain in my heel and I admit I should've been back earlier and it feels like I do not have enough support and I have not been using my splint as indicated    ROS      Objective:  Physical Exam neurovascular status intact with patient's plantar heel left showing inflammation that's improved but still present     Assessment:  Inflammatory fasciitis left heel with inflammation fluid still noted with depression of the arch       Plan:   H&P condition reviewed and recommended orthotics at this time. Patient is scanned for custom orthotic devices to lift up the arch and take pressure off the heels. I did reinject the plantar fascia today 3 mg Kenalog 5 mg Xylocaine to reduce the inflammatory process

## 2016-11-26 DIAGNOSIS — G40219 Localization-related (focal) (partial) symptomatic epilepsy and epileptic syndromes with complex partial seizures, intractable, without status epilepticus: Secondary | ICD-10-CM | POA: Diagnosis not present

## 2016-11-28 ENCOUNTER — Encounter: Payer: BLUE CROSS/BLUE SHIELD | Admitting: Family Medicine

## 2016-12-05 ENCOUNTER — Encounter: Payer: Self-pay | Admitting: Family Medicine

## 2016-12-05 DIAGNOSIS — M79641 Pain in right hand: Secondary | ICD-10-CM

## 2016-12-05 DIAGNOSIS — G5603 Carpal tunnel syndrome, bilateral upper limbs: Secondary | ICD-10-CM

## 2016-12-05 DIAGNOSIS — M79642 Pain in left hand: Principal | ICD-10-CM

## 2016-12-10 ENCOUNTER — Encounter: Payer: BLUE CROSS/BLUE SHIELD | Admitting: Orthotics

## 2016-12-12 DIAGNOSIS — Z0271 Encounter for disability determination: Secondary | ICD-10-CM

## 2016-12-14 ENCOUNTER — Ambulatory Visit: Payer: BLUE CROSS/BLUE SHIELD

## 2016-12-14 DIAGNOSIS — M722 Plantar fascial fibromatosis: Secondary | ICD-10-CM

## 2016-12-14 NOTE — Patient Instructions (Signed)

## 2016-12-23 ENCOUNTER — Other Ambulatory Visit: Payer: Self-pay | Admitting: Neurology

## 2016-12-31 DIAGNOSIS — R52 Pain, unspecified: Secondary | ICD-10-CM | POA: Diagnosis not present

## 2016-12-31 DIAGNOSIS — M79641 Pain in right hand: Secondary | ICD-10-CM | POA: Diagnosis not present

## 2016-12-31 DIAGNOSIS — M79642 Pain in left hand: Secondary | ICD-10-CM | POA: Diagnosis not present

## 2016-12-31 NOTE — Progress Notes (Signed)
Patient presents for orthotic pick up.  Verbal and written break in and wear instructions given.  Patient will follow up in 4 weeks if symptoms worsen or fail to improve. 

## 2017-01-07 ENCOUNTER — Telehealth: Payer: Self-pay | Admitting: Neurology

## 2017-01-07 ENCOUNTER — Telehealth: Payer: Self-pay | Admitting: Family Medicine

## 2017-01-07 DIAGNOSIS — F330 Major depressive disorder, recurrent, mild: Secondary | ICD-10-CM

## 2017-01-07 DIAGNOSIS — M797 Fibromyalgia: Secondary | ICD-10-CM

## 2017-01-07 NOTE — Telephone Encounter (Signed)
The pharmacy called, the patient had been traveling, forgot to take her Keppra medication with her.  I have called in 20 tablets of the 1000 mg Keppra tablets taking 1 twice a day.  Dr. Jaynee Eagles gave a 90-day supply on 25 December 2016.  From the records, it does not look like our office is currently following her for her seizures or pseudoseizures.  She is currently followed by Dr. Ernest Haber from [facility].  Our office should not be continuing to prescribe Keppra.

## 2017-01-07 NOTE — Telephone Encounter (Signed)
See request below for 10 day supply / LOV 11/07/16 with Dr. Tamala Julian

## 2017-01-07 NOTE — Telephone Encounter (Signed)
Copied from [place]. Topic: Quick Communication - Rx Refill/Question >> Jan 07, 2017 10:01 AM Scherrie Gerlach wrote: Has the patient contacted their pharmacy? Yes Pt went to [place] and forgot all her meds.  Pt will be gone for 10 days. Pt is requesting 10 days citalopram (CELEXA) 20 MG tablet DULoxetine (CYMBALTA) 60 MG capsule   rx sent to [facility] [address], [place] Phone  [phone] Fax: [phone]

## 2017-01-08 ENCOUNTER — Other Ambulatory Visit: Payer: Self-pay | Admitting: Neurology

## 2017-01-09 ENCOUNTER — Other Ambulatory Visit: Payer: Self-pay | Admitting: Neurology

## 2017-01-09 NOTE — Telephone Encounter (Signed)
Rebekah [name], please let patient know since Dr. Amalia Hailey is changing her meds then she can call him for a refill but will give her 3 months worth until her next appointment with him thanks

## 2017-01-09 NOTE — Telephone Encounter (Signed)
I called pt. I advised her that since Dr. Amalia Hailey is changing her meds then pt should call him for a refill. Pt says that she has keppra left and will call Dr. Amalia Hailey office.

## 2017-01-10 NOTE — Telephone Encounter (Signed)
Patient said to please send the prescriptions to [facility] in [place] where she is.  The telephone number is [phone].

## 2017-01-11 MED ORDER — CITALOPRAM HYDROBROMIDE 20 MG PO TABS: 20.0000 mg | ORAL_TABLET | Freq: Every day | ORAL | 0 refills | Status: DC

## 2017-01-11 MED ORDER — DULOXETINE HCL 60 MG PO CPEP: 60.0000 mg | ORAL_CAPSULE | Freq: Every day | ORAL | 0 refills | Status: DC

## 2017-01-11 NOTE — Telephone Encounter (Signed)
Call --- Rx sent to [facility] in [place], [place].

## 2017-01-11 NOTE — Telephone Encounter (Signed)
Pt notified and verbalized understanding.

## 2017-01-30 DIAGNOSIS — M79642 Pain in left hand: Secondary | ICD-10-CM | POA: Diagnosis not present

## 2017-01-30 DIAGNOSIS — M79641 Pain in right hand: Secondary | ICD-10-CM | POA: Diagnosis not present

## 2017-01-30 DIAGNOSIS — Z9889 Other specified postprocedural states: Secondary | ICD-10-CM | POA: Diagnosis not present

## 2017-01-30 DIAGNOSIS — M542 Cervicalgia: Secondary | ICD-10-CM | POA: Diagnosis not present

## 2017-02-04 ENCOUNTER — Encounter: Payer: Self-pay | Admitting: Family Medicine

## 2017-02-04 ENCOUNTER — Other Ambulatory Visit: Payer: Self-pay | Admitting: Family Medicine

## 2017-02-04 DIAGNOSIS — Z1231 Encounter for screening mammogram for malignant neoplasm of breast: Secondary | ICD-10-CM

## 2017-02-06 ENCOUNTER — Ambulatory Visit: Payer: BLUE CROSS/BLUE SHIELD | Admitting: Adult Health

## 2017-02-10 ENCOUNTER — Encounter: Payer: Self-pay | Admitting: Family Medicine

## 2017-02-13 ENCOUNTER — Ambulatory Visit: Payer: BLUE CROSS/BLUE SHIELD | Admitting: Family Medicine

## 2017-02-13 ENCOUNTER — Encounter: Payer: Self-pay | Admitting: Family Medicine

## 2017-02-13 ENCOUNTER — Ambulatory Visit: Payer: Self-pay

## 2017-02-13 ENCOUNTER — Other Ambulatory Visit: Payer: Self-pay

## 2017-02-13 VITALS — BP 111/83 | HR 99 | Temp 98.6°F | Resp 16 | Ht 60.24 in | Wt 171.0 lb

## 2017-02-13 DIAGNOSIS — N950 Postmenopausal bleeding: Secondary | ICD-10-CM | POA: Diagnosis not present

## 2017-02-13 LAB — POCT WET + KOH PREP
TRICH BY WET PREP: ABSENT
YEAST BY KOH: ABSENT
YEAST BY WET PREP: ABSENT

## 2017-02-13 NOTE — Telephone Encounter (Signed)
Pt calling with vaginal spotting with passing of small clots. Bleeding started 2 months ago. States she is post menopausal with her LMP 9-10 years ago. States last sexual partner was 1.5 years ago. States the only pain was 3-4 episodes of a "sharp" pain that came and went away quickly. Denies pain now. Appt made with Dr Nolon Rod today at 3:20.  Reason for Disposition . Postmenopausal vaginal bleeding  Answer Assessment - Initial Assessment Questions 1. AMOUNT: "Describe the bleeding that you are having." "How much bleeding is there?"    - SPOTTING: spotting, or pinkish / brownish mucous discharge; does not fill panti-liner or pad    - MILD:  less than 1 pad / hour; less than patient's  menstrual bleeding when she still had menstrual periods   - MODERATE: 1-2 pads / hour; small-medium blood clots (e.g., pea, grape, small coin)    - SEVERE: soaking 2 or more pads/hour for 2 or more hours; bleeding not contained by pads or continuous red blood from vagina; large blood clots (e.g., golf ball, large coin)      Spotting and passing clots the size of a fingernail 2. ONSET: "When did the bleeding begin?" "Is it continuing now?"     2 months ago and is present now 3. MENOPAUSE: "When was your last menstrual period?"      10 years ago 4. ABDOMINAL PAIN: "Do you have any pain?" "How bad is the pain?"  (e.g., Scale 1-10; mild, moderate, or severe)   - MILD (1-3): doesn't interfere with normal activities, abdomen soft and not tender to touch    - MODERATE (4-7): interferes with normal activities or awakens from sleep, tender to touch    - SEVERE (8-10): excruciating pain, doubled over, unable to do any normal activities    Last week 3-4 times had sharp left side of navel then went away  5. BLOOD THINNERS: "Do you take any blood thinners?" (e.g., Coumadin/warfarin, Pradaxa/dabigatran, aspirin)     no 6. HORMONES: "Are you taking any hormone medications, prescription or OTC?" (e.g., birth control pills,  estrogen)     no 7. CAUSE: "What do you think is causing the bleeding?" (e.g., recent gyn surgery, recent gyn procedure; known bleeding disorder, uterine cancer)       "I have no idea" 8. HEMODYNAMIC STATUS: "Are you weak or feeling lightheaded?" If so, ask: "Can you stand and walk normally?"       No  9. OTHER SYMPTOMS: "What other symptoms are you having with the bleeding?" (e.g., back pain, burning with urination, fever)       none  Protocols used: VAGINAL BLEEDING - POSTMENOPAUSAL-A-AH

## 2017-02-13 NOTE — Progress Notes (Signed)
Chief Complaint  Patient presents with  . Vaginal Bleeding    x couple weeks, sharp pain on left side 3-4 times but faded away-this was last week, noticed after shower when urinating she saw slimy reddish blood, per pt she could see clots floating in the toilet.  No pain    HPI   Patient reports that she has been having vaginal bleeding first started a few weeks ago  She noticed it blood in the toilet that looks like slimy clots She reports that yesterday when she cleaned herself in the shower and saw bright red blood and it was mucus  She denies lightheadedness She denies fevers or chills  Reports that pap smear was a year ago and was normal and std was negative Last intercourse was 17 months ago  She used condoms with intercourse  Menopause was 82 yo  Mother had breast cancer Maternal aunt with breast cancer  Colon cancer in her father's side of the family Her colonoscopy was in 06/18/16 and was showing diverticulosis and internal hemorrhoids    Past Medical History:  Diagnosis Date  . Anxiety   . Arthritis   . Asthma   . Depression   . Elevated blood pressure reading 01/09/2016  . Fibromyalgia   . Seizure disorder ([place])   . Seizures ([place])     Current Outpatient Medications  Medication Sig Dispense Refill  . citalopram (CELEXA) 20 MG tablet Take 1 tablet (20 mg total) by mouth daily. 10 tablet 0  . DULoxetine (CYMBALTA) 60 MG capsule Take 1 capsule (60 mg total) by mouth daily. 10 capsule 0  . Vitamin D, Ergocalciferol, (DRISDOL) 50000 units CAPS capsule TAKE 1 CAPSULE (50,000 UNITS TOTAL) BY MOUTH EVERY 7 (SEVEN) DAYS. 4 capsule 2  . DOXYLAMINE SUCCINATE, SLEEP, PO Take by mouth.    . levETIRAcetam (KEPPRA) 1000 MG tablet TAKE 1 TABLET BY MOUTH TWICE DAILY    . LYRICA 75 MG capsule   0   Current Facility-Administered Medications  Medication Dose Route Frequency Provider Last Rate Last Dose  . 0.9 %  sodium chloride infusion  500 mL Intravenous Continuous  Armbruster, Carlota Raspberry, MD      . triamcinolone acetonide (KENALOG) 10 MG/ML injection 10 mg  10 mg Other Once Wallene Huh, DPM        Allergies:  Allergies  Allergen Reactions  . Ambien [Zolpidem Tartrate] Other (See Comments)    Other reaction(s): Hallucination  . Soma [Carisoprodol] Other (See Comments)    chills    Past Surgical History:  Procedure Laterality Date  . BREAST BIOPSY Right    biopsy in the 1990's  . BUNIONECTOMY Bilateral    2000's - R twice and L once  . CARPAL TUNNEL RELEASE Bilateral    2000's  . TUBAL LIGATION  1984    Social History   Socioeconomic History  . Marital status: Married    Spouse name: None  . Number of children: 4  . Years of education: 8  . Highest education level: None  Social Needs  . Financial resource strain: None  . Food insecurity - worry: None  . Food insecurity - inability: None  . Transportation needs - medical: None  . Transportation needs - non-medical: None  Occupational History  . 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 Smoking status: Never Smoker  . Smokeless tobacco: Never Used  Substance and Sexual Activity  . Alcohol use: No    Alcohol/week: 0.0 oz  . Drug  use: No  . Sexual activity: None  Other Topics Concern  . None  Social History Narrative   Lives with daughter, [name]   Caffeine use: coffee/tea/soda daily    Family History  Problem Relation Age of Onset  . Breast cancer Mother   . Diabetes Brother   . Hypertension Brother   . Stroke Brother   . Breast cancer Maternal Grandmother   . Breast cancer Maternal Aunt        x 3  . Colon cancer Paternal Aunt        x 2  . Colon cancer Paternal Uncle      ROS Review of Systems See HPI Constitution: No fevers or chills No malaise No diaphoresis Skin: No rash or itching Eyes: no blurry vision, no double vision GU: no dysuria or hematuria Neuro: no dizziness or headaches all others reviewed and negative   Objective: Vitals:    02/13/17 1535  BP: 111/83  Pulse: 99  Resp: 16  Temp: 98.6 F (37 C)  TempSrc: Oral  SpO2: 93%  Weight: 171 lb (77.6 kg)  Height: 5' 0.24" (1.53 m)    Physical Exam  Constitutional: She is oriented to person, place, and time. She appears well-developed and well-nourished.  HENT:  Head: Normocephalic and atraumatic.  Pulmonary/Chest: Effort normal.  Neurological: She is alert and oriented to person, place, and time.  Psychiatric: She has a normal mood and affect. Her behavior is normal. Judgment and thought content normal.   Vaginal exam Labia normal bilaterally without skin lesions Urethral meatus atrophy  normal appearing without erythema Vagina with watery clear discharge No CMT, ovaries small and not palpable Uterus midline, nontender  Assessment and Plan [place] was seen today for vaginal bleeding.  Diagnoses and all orders for this visit:  Postmenopausal bleeding- advised to follow up with GYN for endometrial biopsy asap Discussed that cancer has to be ruled out -     POCT Wet + KOH Prep -     Ambulatory referral to Gynecology     [place]

## 2017-02-13 NOTE — Patient Instructions (Addendum)

## 2017-02-15 HISTORY — PX: ENDOMETRIAL BIOPSY: SHX622

## 2017-02-20 ENCOUNTER — Other Ambulatory Visit: Payer: Self-pay

## 2017-02-20 ENCOUNTER — Encounter: Payer: Self-pay | Admitting: Obstetrics and Gynecology

## 2017-02-20 ENCOUNTER — Ambulatory Visit: Payer: BLUE CROSS/BLUE SHIELD | Admitting: Obstetrics and Gynecology

## 2017-02-20 ENCOUNTER — Other Ambulatory Visit (HOSPITAL_COMMUNITY)
Admission: RE | Admit: 2017-02-20 | Discharge: 2017-02-20 | Disposition: A | Discharge status: Home / Self Care | Payer: BLUE CROSS/BLUE SHIELD | Source: Ambulatory Visit | Attending: Obstetrics and Gynecology | Admitting: Obstetrics and Gynecology

## 2017-02-20 VITALS — BP 112/80 | HR 80 | Resp 16 | Ht 59.0 in | Wt 170.0 lb

## 2017-02-20 DIAGNOSIS — Z124 Encounter for screening for malignant neoplasm of cervix: Secondary | ICD-10-CM | POA: Diagnosis not present

## 2017-02-20 DIAGNOSIS — R102 Pelvic and perineal pain: Secondary | ICD-10-CM | POA: Diagnosis not present

## 2017-02-20 DIAGNOSIS — N760 Acute vaginitis: Secondary | ICD-10-CM | POA: Diagnosis not present

## 2017-02-20 DIAGNOSIS — B9689 Other specified bacterial agents as the cause of diseases classified elsewhere: Secondary | ICD-10-CM | POA: Diagnosis not present

## 2017-02-20 DIAGNOSIS — N950 Postmenopausal bleeding: Secondary | ICD-10-CM | POA: Insufficient documentation

## 2017-02-20 DIAGNOSIS — R109 Unspecified abdominal pain: Secondary | ICD-10-CM | POA: Diagnosis not present

## 2017-02-20 DIAGNOSIS — C541 Malignant neoplasm of endometrium: Secondary | ICD-10-CM | POA: Diagnosis not present

## 2017-02-20 DIAGNOSIS — N898 Other specified noninflammatory disorders of vagina: Secondary | ICD-10-CM

## 2017-02-20 MED ORDER — METRONIDAZOLE 500 MG PO TABS: 500.0000 mg | ORAL_TABLET | Freq: Two times a day (BID) | ORAL | 0 refills | Status: DC

## 2017-02-20 NOTE — Progress Notes (Signed)
62 y.o. [id number] MarriedCaucasianF here for a consultation from Dr Nolon Rod for PMB.   She went through menopause at age 63. No bleeding until the last few weeks. In the last few weeks she has had some spotting after she voids or in the toilet. She is sure it is coming from her vagina. She had a little d/c last night, not sure what color it was, when she wiped she saw blood. Since last night she has had some intermittent sharp pain in her RLQ. Reports normal BM's.  Not sexually active. No urinary frequency, urgency or pain, no frank hematuria.  Recent testing with her primary returned with BV, on questioning the patient does c/o an increase in vaginal d/c with an odor. She has noticed this for the last few weeks. She hasn't been started on medications yet.     No LMP recorded. Patient is postmenopausal.          Sexually active: No.  The current method of family planning is post menopausal status.    Exercising: No.  The patient does not participate in regular exercise at present. Smoker:  no  Health Maintenance: Pap:  03-09-15 WNL NEG HR HPV  History of abnormal Pap:  no MMG:  02-20-16 WNL  Colonoscopy:  06-18-16 Normal  BMD:   Never TDaP:  04-18-16 Gardasil: N/A   reports that  has never smoked. she has never used smokeless tobacco. She reports that she does not drink alcohol or use drugs.  Past Medical History:  Diagnosis Date  . Anxiety   . Arthritis   . Asthma   . Depression   . Elevated blood pressure reading 01/09/2016  . Fibromyalgia   . Seizure disorder ([place])   . Seizures ([place])     Past Surgical History:  Procedure Laterality Date  . BREAST BIOPSY Right    biopsy in the 1990's  . BUNIONECTOMY Bilateral    2000's - R twice and L once  . CARPAL TUNNEL RELEASE Bilateral    2000's  . TUBAL LIGATION  1984    Current Outpatient Medications  Medication Sig Dispense Refill  . citalopram (CELEXA) 20 MG tablet Take 1 tablet (20 mg total) by mouth daily. 10 tablet 0  .  DULoxetine (CYMBALTA) 60 MG capsule Take 1 capsule (60 mg total) by mouth daily. 10 capsule 0  . levETIRAcetam (KEPPRA) 1000 MG tablet TAKE 1 TABLET BY MOUTH TWICE DAILY     Current Facility-Administered Medications  Medication Dose Route Frequency Provider Last Rate Last Dose  . 0.9 %  sodium chloride infusion  500 mL Intravenous Continuous Armbruster, Carlota Raspberry, MD      . triamcinolone acetonide (KENALOG) 10 MG/ML injection 10 mg  10 mg Other Once Wallene Huh, DPM        Family History  Problem Relation Age of Onset  . Breast cancer Mother   . Diabetes Brother   . Hypertension Brother   . Stroke Brother   . Breast cancer Maternal Grandmother   . Breast cancer Maternal Aunt        x 3  . Colon cancer Paternal Aunt        x 2  . Colon cancer Paternal Uncle     Review of Systems  Constitutional: Negative.   HENT: Negative.   Eyes: Negative.   Respiratory: Negative.   Cardiovascular: Negative.   Gastrointestinal: Negative.   Endocrine: Negative.   Genitourinary: Negative.        Postmenopause bleeding  Musculoskeletal: Negative.   Skin: Negative.   Allergic/Immunologic: Negative.   Neurological: Negative.   Psychiatric/Behavioral: Negative.     Exam:   BP 112/80 (BP Location: Right Arm, Patient Position: Sitting, Cuff Size: Normal)   Pulse 80   Resp 16   Ht 4\' 11"  (1.499 m)   Wt 170 lb (77.1 kg)   BMI 34.34 kg/m   Weight change: @WEIGHTCHANGE @ Height:   Height: 4\' 11"  (149.9 cm)  Ht Readings from Last 3 Encounters:  02/20/17 4\' 11"  (1.499 m)  02/13/17 5' 0.24" (1.53 m)  11/07/16 5' 0.24" (1.53 m)    General appearance: alert, cooperative and appears stated age Head: Normocephalic, without obvious abnormality, atraumatic Neck: no adenopathy, supple, symmetrical, trachea midline and thyroid normal to inspection and palpation Lungs: clear to auscultation bilaterally Cardiovascular: regular rate and rhythm Abdomen: soft, mildly tender in the RLQ, no rebound,  no guarding, non distended,  no masses,  no organomegaly Extremities: extremities normal, atraumatic, no cyanosis or edema Skin: Skin color, texture, turgor normal. No rashes or lesions Lymph nodes: Cervical, supraclavicular, and axillary nodes normal. No abnormal inguinal nodes palpated Neurologic: Grossly normal   Pelvic: External genitalia:  no lesions              Urethra:  normal appearing urethra with no masses, tenderness or lesions              Bartholins and Skenes: normal                 Vagina: atrophic appearing vagina with a small amount of blood noted, no lesions              Cervix: no lesions               Bimanual Exam:  Uterus:  normal size, contour, position, consistency, mobility, non-tender              Adnexa:no masses, tender in the right adnexa               Rectovaginal: Confirms               Anus:  normal sphincter tone, no lesions  The risks of endometrial biopsy were reviewed and a consent was obtained.  A speculum was placed in the vagina and the cervix was cleansed with betadine. A mini-pipelle was placed into the endometrial cavity. The uterus sounded to 7 cm. The endometrial biopsy was performed, moderate to large amount of tissue was obtained. The tenaculum and speculum were removed. There were no complications.    Chaperone was present for exam.  A:  Postmenopausal bleeding  RLQ abdominal/pelvic pain  Recent wet prep with BV, patient does c/o an intermittent increase in vaginal d/c with odor  P:   Pap with hpv  Endometrial biopsy done  Return for ultrasound, possible sonohysterogram  Discussed wet prep results from primary, the patient requests antibiotics, flagyl sent (no ETOH)  Further plan depending on results  CC: Dr Delia Chimes Note sent

## 2017-02-20 NOTE — Patient Instructions (Signed)

## 2017-02-25 ENCOUNTER — Telehealth: Payer: Self-pay

## 2017-02-25 DIAGNOSIS — C55 Malignant neoplasm of uterus, part unspecified: Secondary | ICD-10-CM

## 2017-02-25 NOTE — Telephone Encounter (Signed)
Spoke with patient. Appointment scheduled for 02/27/2017 at 11:30 am with Dr.Jertson. Patient declines all earlier appointments as she does not have transportation. Call to [facility] and spoke with Sharyn Lull. Appointment held for 03/11/2017 at 9 am with Dr.Rossi. Will need to call [facility] after patient is seen here for patient to be placed in slot.  Routing to provider for final review. Patient agreeable to disposition. Will close encounter.

## 2017-02-25 NOTE — Telephone Encounter (Signed)
Left message for patient to call [place] at [phone]. Patient needs to be seen to discuss recent biopsy results with Dr.Jertson.  Spoke with Sharyn Lull at [facility]. Appointment on hold for tomorrow 02/25/2017 at 12:15 pm with Dr.Clarke-Pearson.

## 2017-02-25 NOTE — Telephone Encounter (Signed)
Patient returned call to Kaitlyn. °

## 2017-02-25 NOTE — Telephone Encounter (Signed)
Spoke with patient. Patient states she is unable to drive and will have to speak with her daughter and return call regarding scheduling appointment.

## 2017-02-26 ENCOUNTER — Telehealth: Payer: Self-pay | Admitting: Obstetrics and Gynecology

## 2017-02-26 LAB — CYTOLOGY - PAP
Diagnosis: NEGATIVE
HPV: NOT DETECTED

## 2017-02-26 NOTE — Telephone Encounter (Signed)
Call placed to patient to review benefits for recommended ultrasound with possible sonohysterogram. Patient advised she has an appointment with Dr Talbert Nan tomorrow, 02/27/17 and will schedule at that time.  Routing to Dr Talbert Nan  cc: Tye Savoy, RN

## 2017-02-26 NOTE — Telephone Encounter (Signed)
Please cancel the imaging

## 2017-02-27 ENCOUNTER — Other Ambulatory Visit: Payer: Self-pay

## 2017-02-27 ENCOUNTER — Encounter: Payer: Self-pay | Admitting: Obstetrics and Gynecology

## 2017-02-27 ENCOUNTER — Ambulatory Visit: Payer: BLUE CROSS/BLUE SHIELD | Admitting: Obstetrics and Gynecology

## 2017-02-27 ENCOUNTER — Telehealth: Payer: Self-pay | Admitting: Obstetrics and Gynecology

## 2017-02-27 VITALS — BP 128/80 | HR 88 | Resp 16 | Wt 167.0 lb

## 2017-02-27 DIAGNOSIS — N950 Postmenopausal bleeding: Secondary | ICD-10-CM

## 2017-02-27 DIAGNOSIS — C541 Malignant neoplasm of endometrium: Secondary | ICD-10-CM | POA: Diagnosis not present
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
## 2017-02-27 NOTE — Telephone Encounter (Signed)
Call placed to Dr. Serita Grit office to schedule appointment

## 2017-03-04 ENCOUNTER — Ambulatory Visit
Admission: RE | Admit: 2017-03-04 | Discharge: 2017-03-04 | Disposition: A | Discharge status: Home / Self Care | Payer: BLUE CROSS/BLUE SHIELD | Source: Ambulatory Visit | Attending: Family Medicine | Admitting: Family Medicine

## 2017-03-04 DIAGNOSIS — Z1231 Encounter for screening mammogram for malignant neoplasm of breast: Secondary | ICD-10-CM

## 2017-03-06 ENCOUNTER — Ambulatory Visit: Payer: BLUE CROSS/BLUE SHIELD

## 2017-03-06 DIAGNOSIS — G40219 Localization-related (focal) (partial) symptomatic epilepsy and epileptic syndromes with complex partial seizures, intractable, without status epilepticus: Secondary | ICD-10-CM | POA: Diagnosis not present

## 2017-03-08 ENCOUNTER — Encounter: Payer: Self-pay | Admitting: Family Medicine

## 2017-03-08 DIAGNOSIS — F330 Major depressive disorder, recurrent, mild: Secondary | ICD-10-CM

## 2017-03-08 DIAGNOSIS — M797 Fibromyalgia: Secondary | ICD-10-CM

## 2017-03-10 MED ORDER — DULOXETINE HCL 60 MG PO CPEP: 120.0000 mg | ORAL_CAPSULE | Freq: Every day | ORAL | 0 refills | Status: DC

## 2017-03-11 ENCOUNTER — Telehealth: Payer: Self-pay | Admitting: Gynecologic Oncology

## 2017-03-11 ENCOUNTER — Encounter: Payer: Self-pay | Admitting: Gynecologic Oncology

## 2017-03-11 ENCOUNTER — Inpatient Hospital Stay: Payer: BLUE CROSS/BLUE SHIELD | Attending: Gynecologic Oncology | Admitting: Gynecologic Oncology

## 2017-03-11 VITALS — BP 140/80 | HR 82 | Temp 98.1°F | Resp 20 | Wt 170.3 lb

## 2017-03-11 DIAGNOSIS — G40909 Epilepsy, unspecified, not intractable, without status epilepticus: Secondary | ICD-10-CM | POA: Insufficient documentation

## 2017-03-11 DIAGNOSIS — C530 Malignant neoplasm of endocervix: Secondary | ICD-10-CM | POA: Diagnosis not present

## 2017-03-11 NOTE — Telephone Encounter (Signed)
Dr. Rebekah Chesterfield RN, Angela Nevin returned call to the office.  Stating Dr. Amalia Hailey is aware of the patient's cancer diagnosis.  Stating her Keppra was increased at her last visit since she was unable to afford lyrica.  She states the anesthesiologist would need to be informed of her seizure history.  No concerns voiced about proceeding with scheduled surgery.

## 2017-03-11 NOTE — Progress Notes (Signed)
Consult Note: Gyn-Onc  Consult was requested by Dr. Talbert Nan for the evaluation of Rebekah [name] 62 y.o. female  CC:  Chief Complaint  Patient presents with  . Endocervical adenocarcinoma [facility])    Assessment/Plan:  [name]  is a 62 y.o.  year old with grade 1 endometrial cancer.  A detailed discussion was held with the patient and her family with regard to to her endometrial cancer diagnosis. We discussed the standard management options for uterine cancer which includes surgery followed possibly by adjuvant therapy depending on the results of surgery. The options for surgical management include a hysterectomy and removal of the tubes and ovaries possibly with removal of pelvic and para-aortic lymph nodes.If feasible, a minimally invasive approach including a robotic hysterectomy or laparoscopic hysterectomy have benefits including shorter hospital stay, recovery time and better wound healing than with open surgery. The patient has been counseled about these surgical options and the risks of surgery in general including infection, bleeding, damage to surrounding structures (including bowel, bladder, ureters, nerves or vessels), and the postoperative risks of PE/ DVT, and lymphedema. I extensively reviewed the additional risks of robotic hysterectomy including possible need for conversion to open laparotomy.  I discussed positioning during surgery of trendelenberg and risks of minor facial swelling and care we take in preoperative positioning.  After counseling and consideration of her options, she desires to proceed with robotic assisted total hysterectomy with bilateral sapingo-oophorectomy and SLN biopsy.   She will be seen by anesthesia for preoperative clearance and discussion of postoperative pain management.  She was given the opportunity to ask questions, which were answered to her satisfaction, and she is agreement with the above mentioned plan of care.  She has a  seizure disorder which is fairly well controlled on keppra. I explained the importance of staying on her meds around the time of surgery as surgery can precipitate seizures.   HPI: Rebekah [name] is a 62 year old woman who is seen in consultation at the request of Dr Talbert Nan for endometrial cancer (grade 1).  The patient reports a 1 month history of vaginal bleeding (light). She was seen by Dr Talbert Nan on 02/20/17 and endometrial pipelle was performed. It revealed a grade 1 endometrial cancer.  The patient has family history of breast cancer on the maternal side. She also has a family history of colon cancer on the paternal family. She has a seizure disorder for which she takes Keppra. She has seizures "every other month". She has had a tubal ligation but no other surgeries on her abdomen. She has had 4 vaginal deliveries.    Current Meds:  Outpatient Encounter Medications as of 03/11/2017  Medication Sig  . citalopram (CELEXA) 20 MG tablet Take 1 tablet (20 mg total) by mouth daily.  . DULoxetine (CYMBALTA) 60 MG capsule Take 2 capsules (120 mg total) by mouth daily.  Marland Kitchen levETIRAcetam (KEPPRA) 1000 MG tablet TAKE 1 TABLET BY MOUTH TWICE DAILY  . Multiple Vitamin (MULTIVITAMIN) capsule Take 1 capsule by mouth daily.  Marland Kitchen OVER THE COUNTER MEDICATION Sleep Aide each night if needed.  . ranitidine (ZANTAC) 150 MG capsule Take 150 mg by mouth daily.  . [DISCONTINUED] metroNIDAZOLE (FLAGYL) 500 MG tablet Take 1 tablet (500 mg total) by mouth 2 (two) times daily.   Facility-Administered Encounter Medications as of 03/11/2017  Medication  . 0.9 %  sodium chloride infusion  . triamcinolone acetonide (KENALOG) 10 MG/ML injection 10 mg    Allergy:  Allergies  Allergen Reactions  .  Ambien [Zolpidem Tartrate] Other (See Comments)    Other reaction(s): Hallucination  . Soma [Carisoprodol] Other (See Comments)    chills    Social Hx:   Social History   Socioeconomic History  . Marital  status: Married    Spouse name: Not on file  . Number of children: 4  . Years of education: 8  . Highest education level: Not on file  Social Needs  . Financial resource strain: Not on file  . Food insecurity - worry: Not on file  . Food insecurity - inability: Not on file  . Transportation needs - medical: Not on file  . Transportation needs - non-medical: Not on file  Occupational History  . 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 Smoking status: Never Smoker  . Smokeless tobacco: Never Used  Substance and Sexual Activity  . Alcohol use: No    Alcohol/week: 0.0 oz  . Drug use: No  . Sexual activity: Not Currently    Partners: Male    Birth control/protection: Post-menopausal  Other Topics Concern  . Not on file  Social History Narrative   Lives with daughter, [name]   Caffeine use: coffee/tea/soda daily    Past Surgical Hx:  Past Surgical History:  Procedure Laterality Date  . BREAST BIOPSY Right    biopsy in the 1990's  . BUNIONECTOMY Bilateral    2000's - R twice and L once  . CARPAL TUNNEL RELEASE Bilateral    2000's  . TUBAL LIGATION  1984    Past Medical Hx:  Past Medical History:  Diagnosis Date  . Anxiety   . Arthritis   . Asthma   . Depression   . Elevated blood pressure reading 01/09/2016  . Fibromyalgia   . Seizure disorder ([place])   . Seizures ([place])     Past Gynecological History:  SVD x 4 No LMP recorded. Patient is postmenopausal.  Family Hx:  Family History  Problem Relation Age of Onset  . Breast cancer Mother   . Diabetes Brother   . Hypertension Brother   . Stroke Brother   . Breast cancer Maternal Grandmother   . Breast cancer Maternal Aunt        x 3  . Colon cancer Paternal Aunt        x 2  . Colon cancer Paternal Uncle     Review of Systems:  Constitutional  Feels well,    ENT Normal appearing ears and nares bilaterally Skin/Breast  No rash, sores, jaundice, itching, dryness Cardiovascular  No chest pain,  shortness of breath, or edema  Pulmonary  No cough or wheeze.  Gastro Intestinal  No nausea, vomitting, or diarrhoea. No bright red blood per rectum, no abdominal pain, change in bowel movement, or constipation.  Genito Urinary  No frequency, urgency, dysuria, + postmenopausal bleeding Musculo Skeletal  No myalgia, arthralgia, joint swelling or pain  Neurologic  No weakness, numbness, change in gait,  Psychology  No depression, anxiety, insomnia.   Vitals:  Blood pressure 140/80, pulse 82, temperature 98.1 F (36.7 C), temperature source Oral, resp. rate 20, weight 170 lb 4.8 oz (77.2 kg), SpO2 96 %.  Physical Exam: WD in NAD Neck  Supple NROM, without any enlargements.  Lymph Node Survey No cervical supraclavicular or inguinal adenopathy Cardiovascular  Pulse normal rate, regularity and rhythm. S1 and S2 normal.  Lungs  Clear to auscultation bilateraly, without wheezes/crackles/rhonchi. Good air movement.  Skin  No rash/lesions/breakdown  Psychiatry  Alert and oriented  to person, place, and time  Abdomen  Normoactive bowel sounds, abdomen soft, non-tender and overweight but not obese without evidence of hernia.  Back No CVA tenderness Genito Urinary  Vulva/vagina: Normal external female genitalia.  No lesions. No discharge or bleeding.  Bladder/urethra:  No lesions or masses, well supported bladder  Vagina: normal  Cervix: Normal appearing, no lesions.  Uterus:  Small, retroverted, mobile, no parametrial involvement or nodularity.  Adnexa: no masses. Rectal  deferred  Extremities  No bilateral cyanosis, clubbing or edema.   Donaciano Eva, MD  03/11/2017, 9:36 AM

## 2017-03-11 NOTE — Telephone Encounter (Signed)
Left message for Dr. Rebekah Chesterfield RN about patient's upcoming surgery on March 12 for endometrial cancer.  Wanted to touch base with Dr. Rebekah Chesterfield about upcoming surgery to see if anything needed to be done from a neuro standpoint.

## 2017-03-11 NOTE — Patient Instructions (Signed)
Preparing for your Surgery  Plan for surgery on March 26, 2017 with Dr. Everitt Amber at [place] will be scheduled for a robotic assisted total hysterectomy, bilateral salpingo-oophorectomy, sentinel lymph node biopsy.  Pre-operative Testing -You will receive a phone call from presurgical testing at [facility] to arrange for a pre-operative testing appointment before your surgery.  This appointment normally occurs one to two weeks before your scheduled surgery.   -Bring your insurance card, copy of an advanced directive if applicable, medication list  -At that visit, you will be asked to sign a consent for a possible blood transfusion in case a transfusion becomes necessary during surgery.  The need for a blood transfusion is rare but having consent is a necessary part of your care.     -You should not be taking blood thinners or aspirin at least ten days prior to surgery unless instructed by your surgeon.  Day Before Surgery at [place] will be asked to take in a light diet the day before surgery.  Avoid carbonated beverages.  You will be advised to have nothing to eat or drink after midnight the evening before.    Eat a light diet the day before surgery.  Examples including soups, broths, toast, yogurt, mashed potatoes.  Things to avoid include carbonated beverages (fizzy beverages), raw fruits and raw vegetables, or beans.   If your bowels are filled with gas, your surgeon will have difficulty visualizing your pelvic organs which increases your surgical risks.  Your role in recovery Your role is to become active as soon as directed by your doctor, while still giving yourself time to heal.  Rest when you feel tired. You will be asked to do the following in order to speed your recovery:  - Cough and breathe deeply. This helps toclear and expand your lungs and can prevent pneumonia. You may be given a spirometer to practice deep breathing. A staff  member will show you how to use the spirometer. - Do mild physical activity. Walking or moving your legs help your circulation and body functions return to normal. A staff member will help you when you try to walk and will provide you with simple exercises. Do not try to get up or walk alone the first time. - Actively manage your pain. Managing your pain lets you move in comfort. We will ask you to rate your pain on a scale of zero to 10. It is your responsibility to tell your doctor or nurse where and how much you hurt so your pain can be treated.  Special Considerations -If you are diabetic, you may be placed on insulin after surgery to have closer control over your blood sugars to promote healing and recovery.  This does not mean that you will be discharged on insulin.  If applicable, your oral antidiabetics will be resumed when you are tolerating a solid diet.  -Your final pathology results from surgery should be available by the Friday after surgery and the results will be relayed to you when available.  -Dr. Lahoma Crocker is the Surgeon that assists your GYN Oncologist with surgery.  The next day after your surgery you will either see your GYN Oncologist or Dr. Lahoma Crocker.   Blood Transfusion Information WHAT IS A BLOOD TRANSFUSION? A transfusion is the replacement of blood or some of its parts. Blood is made up of multiple cells which provide different functions.  Red blood cells carry oxygen and are used for blood  loss replacement.  White blood cells fight against infection.  Platelets control bleeding.  Plasma helps clot blood.  Other blood products are available for specialized needs, such as hemophilia or other clotting disorders. BEFORE THE TRANSFUSION  Who gives blood for transfusions?   You may be able to donate blood to be used at a later date on yourself (autologous donation).  Relatives can be asked to donate blood. This is generally not any safer than if  you have received blood from a stranger. The same precautions are taken to ensure safety when a relative's blood is donated.  Healthy volunteers who are fully evaluated to make sure their blood is safe. This is blood bank blood. Transfusion therapy is the safest it has ever been in the practice of medicine. Before blood is taken from a donor, a complete history is taken to make sure that person has no history of diseases nor engages in risky social behavior (examples are intravenous drug use or sexual activity with multiple partners). The donor's travel history is screened to minimize risk of transmitting infections, such as malaria. The donated blood is tested for signs of infectious diseases, such as HIV and hepatitis. The blood is then tested to be sure it is compatible with you in order to minimize the chance of a transfusion reaction. If you or a relative donates blood, this is often done in anticipation of surgery and is not appropriate for emergency situations. It takes many days to process the donated blood. RISKS AND COMPLICATIONS Although transfusion therapy is very safe and saves many lives, the main dangers of transfusion include:   Getting an infectious disease.  Developing a transfusion reaction. This is an allergic reaction to something in the blood you were given. Every precaution is taken to prevent this. The decision to have a blood transfusion has been considered carefully by your caregiver before blood is given. Blood is not given unless the benefits outweigh the risks.

## 2017-03-11 NOTE — H&P (View-Only) (Signed)
Consult Note: Gyn-Onc  Consult was requested by Dr. Talbert Nan for the evaluation of Rebekah [name] 62 y.o. female  CC:  Chief Complaint  Patient presents with  . Endocervical adenocarcinoma [facility])    Assessment/Plan:  Rebekah [name]  is a 62 y.o.  year old with grade 1 endometrial cancer.  A detailed discussion was held with the patient and her family with regard to to her endometrial cancer diagnosis. We discussed the standard management options for uterine cancer which includes surgery followed possibly by adjuvant therapy depending on the results of surgery. The options for surgical management include a hysterectomy and removal of the tubes and ovaries possibly with removal of pelvic and para-aortic lymph nodes.If feasible, a minimally invasive approach including a robotic hysterectomy or laparoscopic hysterectomy have benefits including shorter hospital stay, recovery time and better wound healing than with open surgery. The patient has been counseled about these surgical options and the risks of surgery in general including infection, bleeding, damage to surrounding structures (including bowel, bladder, ureters, nerves or vessels), and the postoperative risks of PE/ DVT, and lymphedema. I extensively reviewed the additional risks of robotic hysterectomy including possible need for conversion to open laparotomy.  I discussed positioning during surgery of trendelenberg and risks of minor facial swelling and care we take in preoperative positioning.  After counseling and consideration of her options, she desires to proceed with robotic assisted total hysterectomy with bilateral sapingo-oophorectomy and SLN biopsy.   She will be seen by anesthesia for preoperative clearance and discussion of postoperative pain management.  She was given the opportunity to ask questions, which were answered to her satisfaction, and she is agreement with the above mentioned plan of care.  She has a  seizure disorder which is fairly well controlled on keppra. I explained the importance of staying on her meds around the time of surgery as surgery can precipitate seizures.   HPI: Rebekah [name] is a 62 year old woman who is seen in consultation at the request of Dr Talbert Nan for endometrial cancer (grade 1).  The patient reports a 1 month history of vaginal bleeding (light). She was seen by Dr Talbert Nan on 02/20/17 and endometrial pipelle was performed. It revealed a grade 1 endometrial cancer.  The patient has family history of breast cancer on the maternal side. She also has a family history of colon cancer on the paternal family. She has a seizure disorder for which she takes Keppra. She has seizures "every other month". She has had a tubal ligation but no other surgeries on her abdomen. She has had 4 vaginal deliveries.    Current Meds:  Outpatient Encounter Medications as of 03/11/2017  Medication Sig  . citalopram (CELEXA) 20 MG tablet Take 1 tablet (20 mg total) by mouth daily.  . DULoxetine (CYMBALTA) 60 MG capsule Take 2 capsules (120 mg total) by mouth daily.  Marland Kitchen levETIRAcetam (KEPPRA) 1000 MG tablet TAKE 1 TABLET BY MOUTH TWICE DAILY  . Multiple Vitamin (MULTIVITAMIN) capsule Take 1 capsule by mouth daily.  Marland Kitchen OVER THE COUNTER MEDICATION Sleep Aide each night if needed.  . ranitidine (ZANTAC) 150 MG capsule Take 150 mg by mouth daily.  . [DISCONTINUED] metroNIDAZOLE (FLAGYL) 500 MG tablet Take 1 tablet (500 mg total) by mouth 2 (two) times daily.   Facility-Administered Encounter Medications as of 03/11/2017  Medication  . 0.9 %  sodium chloride infusion  . triamcinolone acetonide (KENALOG) 10 MG/ML injection 10 mg    Allergy:  Allergies  Allergen Reactions  .  Ambien [Zolpidem Tartrate] Other (See Comments)    Other reaction(s): Hallucination  . Soma [Carisoprodol] Other (See Comments)    chills    Social Hx:   Social History   Socioeconomic History  . Marital  status: Married    Spouse name: Not on file  . Number of children: 4  . Years of education: 8  . Highest education level: Not on file  Social Needs  . Financial resource strain: Not on file  . Food insecurity - worry: Not on file  . Food insecurity - inability: Not on file  . Transportation needs - medical: Not on file  . Transportation needs - non-medical: Not on file  Occupational History  . 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 Smoking status: Never Smoker  . Smokeless tobacco: Never Used  Substance and Sexual Activity  . Alcohol use: No    Alcohol/week: 0.0 oz  . Drug use: No  . Sexual activity: Not Currently    Partners: Male    Birth control/protection: Post-menopausal  Other Topics Concern  . Not on file  Social History Narrative   Lives with daughter, [name]   Caffeine use: coffee/tea/soda daily    Past Surgical Hx:  Past Surgical History:  Procedure Laterality Date  . BREAST BIOPSY Right    biopsy in the 1990's  . BUNIONECTOMY Bilateral    2000's - R twice and L once  . CARPAL TUNNEL RELEASE Bilateral    2000's  . TUBAL LIGATION  1984    Past Medical Hx:  Past Medical History:  Diagnosis Date  . Anxiety   . Arthritis   . Asthma   . Depression   . Elevated blood pressure reading 01/09/2016  . Fibromyalgia   . Seizure disorder ([place])   . Seizures ([place])     Past Gynecological History:  SVD x 4 No LMP recorded. Patient is postmenopausal.  Family Hx:  Family History  Problem Relation Age of Onset  . Breast cancer Mother   . Diabetes Brother   . Hypertension Brother   . Stroke Brother   . Breast cancer Maternal Grandmother   . Breast cancer Maternal Aunt        x 3  . Colon cancer Paternal Aunt        x 2  . Colon cancer Paternal Uncle     Review of Systems:  Constitutional  Feels well,    ENT Normal appearing ears and nares bilaterally Skin/Breast  No rash, sores, jaundice, itching, dryness Cardiovascular  No chest pain,  shortness of breath, or edema  Pulmonary  No cough or wheeze.  Gastro Intestinal  No nausea, vomitting, or diarrhoea. No bright red blood per rectum, no abdominal pain, change in bowel movement, or constipation.  Genito Urinary  No frequency, urgency, dysuria, + postmenopausal bleeding Musculo Skeletal  No myalgia, arthralgia, joint swelling or pain  Neurologic  No weakness, numbness, change in gait,  Psychology  No depression, anxiety, insomnia.   Vitals:  Blood pressure 140/80, pulse 82, temperature 98.1 F (36.7 C), temperature source Oral, resp. rate 20, weight 170 lb 4.8 oz (77.2 kg), SpO2 96 %.  Physical Exam: WD in NAD Neck  Supple NROM, without any enlargements.  Lymph Node Survey No cervical supraclavicular or inguinal adenopathy Cardiovascular  Pulse normal rate, regularity and rhythm. S1 and S2 normal.  Lungs  Clear to auscultation bilateraly, without wheezes/crackles/rhonchi. Good air movement.  Skin  No rash/lesions/breakdown  Psychiatry  Alert and oriented  to person, place, and time  Abdomen  Normoactive bowel sounds, abdomen soft, non-tender and overweight but not obese without evidence of hernia.  Back No CVA tenderness Genito Urinary  Vulva/vagina: Normal external female genitalia.  No lesions. No discharge or bleeding.  Bladder/urethra:  No lesions or masses, well supported bladder  Vagina: normal  Cervix: Normal appearing, no lesions.  Uterus:  Small, retroverted, mobile, no parametrial involvement or nodularity.  Adnexa: no masses. Rectal  deferred  Extremities  No bilateral cyanosis, clubbing or edema.   Donaciano Eva, MD  03/11/2017, 9:36 AM

## 2017-03-13 ENCOUNTER — Encounter: Payer: Self-pay | Admitting: Family Medicine

## 2017-03-14 DIAGNOSIS — Z0289 Encounter for other administrative examinations: Secondary | ICD-10-CM

## 2017-03-15 NOTE — Progress Notes (Signed)
Patient is light diet day before surgery per [facility] np

## 2017-03-18 NOTE — Patient Instructions (Addendum)
Rebekah [name]  03/18/2017   Your procedure is scheduled on: Tuesday 03-26-17  Report to [facility] Main  Entrance  Report to admitting at 930 AM   Call this number if you have problems the morning of surgery [phone]   Remember: Do not eat food :After Midnight Monday NIGHT   NO SOLID FOOD AFTER MIDNIGHT THE NIGHT PRIOR TO SURGERY. NOTHING BY MOUTH EXCEPT CLEAR LIQUIDS UNTIL 3 HOURS PRIOR TO [place] SURGERY. PLEASE FINISH ENSURE DRINK PER SURGEON ORDER 3 HOURS PRIOR TO SCHEDULED SURGERY TIME WHICH NEEDS TO BE COMPLETED AT 830 AM.  LIGHT DIET DAY BEFORE SURGERY Monday 03-25-17 SEE INSTRUCTIONS BELOW.  CLEAR LIQUID DIET   Foods Allowed                                                                     Foods Excluded  Coffee and tea, regular and decaf                             liquids that you cannot  Plain Jell-O in any flavor                                             see through such as: Fruit ices (not with fruit pulp)                                     milk, soups, orange juice  Iced Popsicles                                    All solid food                                 Cranberry, grape and apple juices Sports drinks like Gatorade Lightly seasoned clear broth or consume(fat free) Sugar, honey syrup  Sample Menu Breakfast                                Lunch                                     Supper Cranberry juice                    Beef broth                            Chicken broth Jell-O                                     Grape juice  Apple juice Coffee or tea                        Jell-O                                      Popsicle                                                Coffee or tea                        Coffee or tea  _____________________________________________________________________ Eat a light diet the day before surgery.  Examples including soups, broths, toast, yogurt, mashed potatoes.   Things to avoid include carbonated beverages (fizzy beverages), raw fruits and raw vegetables, or beans.   If your bowels are filled with gas, your surgeon will have difficulty visualizing your pelvic organs which increases your surgical risks.   Take these medicines the morning of surgery with A SIP OF WATER: KEPPRA, CITALOPRAM (CELEXA), DULOXETINE (CYMBALTA), RANITIDINE (ZANTAC)                               You may not have any metal on your body including hair pins and              piercings  Do not wear jewelry, make-up, lotions, powders or perfumes, deodorant             Do not wear nail polish.  Do not shave  48 hours prior to surgery.              Men may shave face and neck.   Do not bring valuables to the hospital. [place].  Contacts, dentures or bridgework may not be worn into surgery.  Leave suitcase in the car. After surgery it may be brought to your room.                  Please read over the following fact sheets you were given: _____________________________________________________________________  [facility] - Preparing for Surgery Before surgery, you can play an important role.  Because skin is not sterile, your skin needs to be as free of germs as possible.  You can reduce the number of germs on your skin by washing with CHG (chlorahexidine gluconate) soap before surgery.  CHG is an antiseptic cleaner which kills germs and bonds with the skin to continue killing germs even after washing. Please DO NOT use if you have an allergy to CHG or antibacterial soaps.  If your skin becomes reddened/irritated stop using the CHG and inform your nurse when you arrive at Short Stay. Do not shave (including legs and underarms) for at least 48 hours prior to the first CHG shower.  You may shave your face/neck. Please follow these instructions carefully:  1.  Shower with CHG Soap the night before surgery and the  morning of Surgery.  2.   If you choose to wash your hair, wash your hair first as usual with your  normal  shampoo.  3.  After you shampoo, rinse your hair and body thoroughly to remove the  shampoo.                           4.  Use CHG as you would any other liquid soap.  You can apply chg directly  to the skin and wash                       Gently with a scrungie or clean washcloth.  5.  Apply the CHG Soap to your body ONLY FROM THE NECK DOWN.   Do not use on face/ open                           Wound or open sores. Avoid contact with eyes, ears mouth and genitals (private parts).                       Wash face,  Genitals (private parts) with your normal soap.             6.  Wash thoroughly, paying special attention to the area where your surgery  will be performed.  7.  Thoroughly rinse your body with warm water from the neck down.  8.  DO NOT shower/wash with your normal soap after using and rinsing off  the CHG Soap.                9.  Pat yourself dry with a clean towel.            10.  Wear clean pajamas.            11.  Place clean sheets on your bed the night of your first shower and do not  sleep with pets. Day of Surgery : Do not apply any lotions/deodorants the morning of surgery.  Please wear clean clothes to the hospital/surgery center.  FAILURE TO FOLLOW THESE INSTRUCTIONS MAY RESULT IN THE CANCELLATION OF YOUR SURGERY PATIENT SIGNATURE_________________________________  NURSE SIGNATURE__________________________________  ________________________________________________________________________   Rebekah [name]  An incentive spirometer is a tool that can help keep your lungs clear and active. This tool measures how well you are filling your lungs with each breath. Taking long deep breaths may help reverse or decrease the chance of developing breathing (pulmonary) problems (especially infection) following:  A long period of time when you are unable to move or be active. BEFORE THE PROCEDURE    If the spirometer includes an indicator to show your best effort, your nurse or respiratory therapist will set it to a desired goal.  If possible, sit up straight or lean slightly forward. Try not to slouch.  Hold the incentive spirometer in an upright position. INSTRUCTIONS FOR USE  1. Sit on the edge of your bed if possible, or sit up as far as you can in bed or on a chair. 2. Hold the incentive spirometer in an upright position. 3. Breathe out normally. 4. Place the mouthpiece in your mouth and seal your lips tightly around it. 5. Breathe in slowly and as deeply as possible, raising the piston or the ball toward the top of the column. 6. Hold your breath for 3-5 seconds or for as long as possible. Allow the piston or ball to fall to the bottom of the column. 7. Remove the mouthpiece from your mouth and breathe out normally. 8. Rest for a  few seconds and repeat Steps 1 through 7 at least 10 times every 1-2 hours when you are awake. Take your time and take a few normal breaths between deep breaths. 9. The spirometer may include an indicator to show your best effort. Use the indicator as a goal to work toward during each repetition. 10. After each set of 10 deep breaths, practice coughing to be sure your lungs are clear. If you have an incision (the cut made at the time of surgery), support your incision when coughing by placing a pillow or rolled up towels firmly against it. Once you are able to get out of bed, walk around indoors and cough well. You may stop using the incentive spirometer when instructed by your caregiver.  RISKS AND COMPLICATIONS  Take your time so you do not get dizzy or light-headed.  If you are in pain, you may need to take or ask for pain medication before doing incentive spirometry. It is harder to take a deep breath if you are having pain. AFTER USE  Rest and breathe slowly and easily.  It can be helpful to keep track of a log of your progress. Your caregiver  can provide you with a simple table to help with this. If you are using the spirometer at home, follow these instructions: [place] IF:   You are having difficultly using the spirometer.  You have trouble using the spirometer as often as instructed.  Your pain medication is not giving enough relief while using the spirometer.  You develop fever of 100.5 F (38.1 C) or higher. SEEK IMMEDIATE MEDICAL CARE IF:   You cough up bloody sputum that had not been present before.  You develop fever of 102 F (38.9 C) or greater.  You develop worsening pain at or near the incision site. MAKE SURE YOU:   Understand these instructions.  Will watch your condition.  Will get help right away if you are not doing well or get worse. Document Released: 05/14/2006 Document Revised: 03/26/2011 Document Reviewed: 07/15/2006 Exit[employer] Patient Information 2014 Exit[employer], [place].   ________________________________________________________________________  WHAT IS A BLOOD TRANSFUSION? Blood Transfusion Information  A transfusion is the replacement of blood or some of its parts. Blood is made up of multiple cells which provide different functions.  Red blood cells carry oxygen and are used for blood loss replacement.  White blood cells fight against infection.  Platelets control bleeding.  Plasma helps clot blood.  Other blood products are available for specialized needs, such as hemophilia or other clotting disorders. BEFORE THE TRANSFUSION  Who gives blood for transfusions?   Healthy volunteers who are fully evaluated to make sure their blood is safe. This is blood bank blood. Transfusion therapy is the safest it has ever been in the practice of medicine. Before blood is taken from a donor, a complete history is taken to make sure that person has no history of diseases nor engages in risky social behavior (examples are intravenous drug use or sexual activity with multiple partners). The  donor's travel history is screened to minimize risk of transmitting infections, such as malaria. The donated blood is tested for signs of infectious diseases, such as HIV and hepatitis. The blood is then tested to be sure it is compatible with you in order to minimize the chance of a transfusion reaction. If you or a relative donates blood, this is often done in anticipation of surgery and is not appropriate for emergency situations. It takes many days to  process the donated blood. RISKS AND COMPLICATIONS Although transfusion therapy is very safe and saves many lives, the main dangers of transfusion include:   Getting an infectious disease.  Developing a transfusion reaction. This is an allergic reaction to something in the blood you were given. Every precaution is taken to prevent this. The decision to have a blood transfusion has been considered carefully by your caregiver before blood is given. Blood is not given unless the benefits outweigh the risks. AFTER THE TRANSFUSION  Right after receiving a blood transfusion, you will usually feel much better and more energetic. This is especially true if your red blood cells have gotten low (anemic). The transfusion raises the level of the red blood cells which carry oxygen, and this usually causes an energy increase.  The nurse administering the transfusion will monitor you carefully for complications. HOME CARE INSTRUCTIONS  No special instructions are needed after a transfusion. You may find your energy is better. Speak with your caregiver about any limitations on activity for underlying diseases you may have. SEEK MEDICAL CARE IF:   Your condition is not improving after your transfusion.  You develop redness or irritation at the intravenous (IV) site. SEEK IMMEDIATE MEDICAL CARE IF:  Any of the following symptoms occur over the next 12 hours:  Shaking chills.  You have a temperature by mouth above 102 F (38.9 C), not controlled by  medicine.  Chest, back, or muscle pain.  People around you feel you are not acting correctly or are confused.  Shortness of breath or difficulty breathing.  Dizziness and fainting.  You get a rash or develop hives.  You have a decrease in urine output.  Your urine turns a dark color or changes to pink, red, or brown. Any of the following symptoms occur over the next 10 days:  You have a temperature by mouth above 102 F (38.9 C), not controlled by medicine.  Shortness of breath.  Weakness after normal activity.  The white part of the eye turns yellow (jaundice).  You have a decrease in the amount of urine or are urinating less often.  Your urine turns a dark color or changes to pink, red, or brown. Document Released: 12/30/1999 Document Revised: 03/26/2011 Document Reviewed: 08/18/2007 [facility] Patient Information 2014 [place], [place].  _______________________________________________________________________

## 2017-03-20 ENCOUNTER — Encounter (HOSPITAL_COMMUNITY)
Admission: RE | Admit: 2017-03-20 | Discharge: 2017-03-20 | Disposition: A | Discharge status: Home / Self Care | Payer: BLUE CROSS/BLUE SHIELD | Source: Ambulatory Visit | Attending: Gynecologic Oncology | Admitting: Gynecologic Oncology

## 2017-03-20 ENCOUNTER — Encounter (HOSPITAL_COMMUNITY): Payer: Self-pay

## 2017-03-20 ENCOUNTER — Other Ambulatory Visit: Payer: Self-pay

## 2017-03-20 DIAGNOSIS — Z01812 Encounter for preprocedural laboratory examination: Secondary | ICD-10-CM | POA: Insufficient documentation

## 2017-03-20 DIAGNOSIS — Z01810 Encounter for preprocedural cardiovascular examination: Secondary | ICD-10-CM | POA: Diagnosis not present

## 2017-03-20 HISTORY — DX: Gastro-esophageal reflux disease without esophagitis: K21.9

## 2017-03-20 HISTORY — DX: Malignant (primary) neoplasm, unspecified: C80.1

## 2017-03-20 HISTORY — DX: Insomnia, unspecified: G47.00

## 2017-03-20 LAB — URINALYSIS, ROUTINE W REFLEX MICROSCOPIC
Bacteria, UA: NONE SEEN
Bilirubin Urine: NEGATIVE
Glucose, UA: NEGATIVE mg/dL
Ketones, ur: NEGATIVE mg/dL
Nitrite: NEGATIVE
PH: 5 (ref 5.0–8.0)
Protein, ur: NEGATIVE mg/dL
SPECIFIC GRAVITY, URINE: 1.021 (ref 1.005–1.030)

## 2017-03-20 LAB — COMPREHENSIVE METABOLIC PANEL
ALBUMIN: 4.1 g/dL (ref 3.5–5.0)
ALK PHOS: 69 U/L (ref 38–126)
ALT: 21 U/L (ref 14–54)
ANION GAP: 9 (ref 5–15)
AST: 26 U/L (ref 15–41)
BILIRUBIN TOTAL: 0.6 mg/dL (ref 0.3–1.2)
BUN: 17 mg/dL (ref 6–20)
CALCIUM: 8.8 mg/dL — AB (ref 8.9–10.3)
CO2: 25 mmol/L (ref 22–32)
Chloride: 104 mmol/L (ref 101–111)
Creatinine, Ser: 0.84 mg/dL (ref 0.44–1.00)
GFR calc Af Amer: >60 mL/min (ref >60)
GFR calc non Af Amer: >60 mL/min (ref >60)
GLUCOSE: 100 mg/dL — AB (ref 65–99)
Potassium: 3.9 mmol/L (ref 3.5–5.1)
SODIUM: 138 mmol/L (ref 135–145)
Total Protein: 7.2 g/dL (ref 6.5–8.1)

## 2017-03-20 LAB — CBC
HEMATOCRIT: 43.5 % (ref 36.0–46.0)
HEMOGLOBIN: 14.4 g/dL (ref 12.0–15.0)
MCH: 32.4 pg (ref 26.0–34.0)
MCHC: 33.1 g/dL (ref 30.0–36.0)
MCV: 97.8 fL (ref 78.0–100.0)
Platelets: 244 10*3/uL (ref 150–400)
RBC: 4.45 MIL/uL (ref 3.87–5.11)
RDW: 12.9 % (ref 11.5–15.5)
WBC: 5.1 10*3/uL (ref 4.0–10.5)

## 2017-03-20 LAB — ABO/RH: ABO/RH(D): O POS

## 2017-03-20 NOTE — Progress Notes (Signed)
LOV DR [facility] 03-06-17 Rebekah [name]

## 2017-03-21 LAB — URINE CULTURE

## 2017-03-21 NOTE — Progress Notes (Signed)
Urine culture results routed to [facility] cross np and left message with louise

## 2017-03-25 NOTE — Anesthesia Preprocedure Evaluation (Signed)
Anesthesia Evaluation  Patient identified by MRN, date of birth, ID band Patient awake    Reviewed: Allergy & Precautions, NPO status , Patient's Chart, lab work & pertinent test results  Airway Mallampati: II  TM Distance: >3 FB Neck ROM: Full    Dental no notable dental hx.    Pulmonary neg pulmonary ROS,    Pulmonary exam normal breath sounds clear to auscultation       Cardiovascular negative cardio ROS Normal cardiovascular exam Rhythm:Regular Rate:Normal     Neuro/Psych Seizures -,  PSYCHIATRIC DISORDERS Anxiety Depression Seizure disorder  LAST SEIZURE FEBRUARY 2019 SEES DR  [facility]    Neuromuscular disease    GI/Hepatic Neg liver ROS, GERD  ,  Endo/Other  negative endocrine ROS  Renal/GU negative Renal ROS  negative genitourinary   Musculoskeletal  (+) Arthritis , Osteoarthritis,  Fibromyalgia -  Abdominal   Peds negative pediatric ROS (+)  Hematology negative hematology ROS (+)   Anesthesia Other Findings   Reproductive/Obstetrics negative OB ROS                             Anesthesia Physical Anesthesia Plan  ASA: III  Anesthesia Plan: General   Post-op Pain Management:    Induction: Intravenous  PONV Risk Score and Plan: 3 and Ondansetron, Treatment may vary due to age or medical condition, Dexamethasone and Midazolam  Airway Management Planned: Oral ETT  Additional Equipment:   Intra-op Plan:   Post-operative Plan: Extubation in OR  Informed Consent: I have reviewed the patients History and Physical, chart, labs and discussed the procedure including the risks, benefits and alternatives for the proposed anesthesia with the patient or authorized representative who has indicated his/her understanding and acceptance.     Plan Discussed with: Anesthesiologist and CRNA  Anesthesia Plan Comments: (  )        Anesthesia Quick Evaluation

## 2017-03-26 ENCOUNTER — Other Ambulatory Visit (HOSPITAL_COMMUNITY)
Admission: RE | Admit: 2017-03-26 | Discharge: 2017-03-26 | Disposition: A | Discharge status: Home / Self Care | Payer: BLUE CROSS/BLUE SHIELD | Source: Ambulatory Visit | Attending: Hematology and Oncology | Admitting: Hematology and Oncology

## 2017-03-26 ENCOUNTER — Encounter (HOSPITAL_COMMUNITY): Payer: Self-pay | Admitting: Anesthesiology

## 2017-03-26 ENCOUNTER — Ambulatory Visit (HOSPITAL_COMMUNITY): Payer: BLUE CROSS/BLUE SHIELD | Admitting: Anesthesiology

## 2017-03-26 ENCOUNTER — Other Ambulatory Visit: Payer: Self-pay

## 2017-03-26 ENCOUNTER — Ambulatory Visit (HOSPITAL_COMMUNITY)
Admission: RE | Admit: 2017-03-26 | Discharge: 2017-03-27 | Disposition: A | Discharge status: Home / Self Care | Payer: BLUE CROSS/BLUE SHIELD | Source: Ambulatory Visit | Attending: Gynecologic Oncology | Admitting: Gynecologic Oncology

## 2017-03-26 ENCOUNTER — Encounter (HOSPITAL_COMMUNITY)
Admission: RE | Disposition: A | Discharge status: Home / Self Care | Payer: Self-pay | Source: Ambulatory Visit | Attending: Gynecologic Oncology

## 2017-03-26 DIAGNOSIS — F329 Major depressive disorder, single episode, unspecified: Secondary | ICD-10-CM | POA: Diagnosis not present

## 2017-03-26 DIAGNOSIS — Z803 Family history of malignant neoplasm of breast: Secondary | ICD-10-CM | POA: Insufficient documentation

## 2017-03-26 DIAGNOSIS — F419 Anxiety disorder, unspecified: Secondary | ICD-10-CM | POA: Diagnosis not present

## 2017-03-26 DIAGNOSIS — G40909 Epilepsy, unspecified, not intractable, without status epilepticus: Secondary | ICD-10-CM | POA: Insufficient documentation

## 2017-03-26 DIAGNOSIS — C779 Secondary and unspecified malignant neoplasm of lymph node, unspecified: Secondary | ICD-10-CM | POA: Diagnosis not present

## 2017-03-26 DIAGNOSIS — M1990 Unspecified osteoarthritis, unspecified site: Secondary | ICD-10-CM | POA: Diagnosis not present

## 2017-03-26 DIAGNOSIS — C5701 Malignant neoplasm of right fallopian tube: Secondary | ICD-10-CM | POA: Insufficient documentation

## 2017-03-26 DIAGNOSIS — Z79899 Other long term (current) drug therapy: Secondary | ICD-10-CM | POA: Diagnosis not present

## 2017-03-26 DIAGNOSIS — C542 Malignant neoplasm of myometrium: Secondary | ICD-10-CM | POA: Insufficient documentation

## 2017-03-26 DIAGNOSIS — C775 Secondary and unspecified malignant neoplasm of intrapelvic lymph nodes: Secondary | ICD-10-CM | POA: Diagnosis not present

## 2017-03-26 DIAGNOSIS — C541 Malignant neoplasm of endometrium: Secondary | ICD-10-CM | POA: Diagnosis not present

## 2017-03-26 DIAGNOSIS — C530 Malignant neoplasm of endocervix: Secondary | ICD-10-CM | POA: Insufficient documentation

## 2017-03-26 DIAGNOSIS — M797 Fibromyalgia: Secondary | ICD-10-CM | POA: Diagnosis not present

## 2017-03-26 DIAGNOSIS — F418 Other specified anxiety disorders: Secondary | ICD-10-CM | POA: Diagnosis not present

## 2017-03-26 HISTORY — PX: ROBOTIC ASSISTED TOTAL HYSTERECTOMY WITH BILATERAL SALPINGO OOPHERECTOMY: SHX6086

## 2017-03-26 LAB — TYPE AND SCREEN
ABO/RH(D): O POS
ANTIBODY SCREEN: NEGATIVE

## 2017-03-26 SURGERY — HYSTERECTOMY, TOTAL, ROBOT-ASSISTED, LAPAROSCOPIC, WITH BILATERAL SALPINGO-OOPHORECTOMY
Anesthesia: General | Laterality: Bilateral

## 2017-03-26 MED ORDER — OXYCODONE HCL 5 MG/5ML PO SOLN
5.0000 mg | Freq: Once | ORAL | Status: DC | PRN
  Filled 2017-03-26: qty 5

## 2017-03-26 MED ORDER — KCL IN DEXTROSE-NACL 20-5-0.45 MEQ/L-%-% IV SOLN
INTRAVENOUS | Status: DC
  Administered 2017-03-26: 1000 mL via INTRAVENOUS
  Filled 2017-03-26: qty 1000

## 2017-03-26 MED ORDER — LIDOCAINE 2% (20 MG/ML) 5 ML SYRINGE
INTRAMUSCULAR | Status: AC
  Filled 2017-03-26: qty 5

## 2017-03-26 MED ORDER — LACTATED RINGERS IR SOLN
Status: DC | PRN
  Administered 2017-03-26: 1000 mL

## 2017-03-26 MED ORDER — OXYCODONE HCL 5 MG PO TABS: 5.0000 mg | ORAL_TABLET | Freq: Once | ORAL | Status: DC | PRN

## 2017-03-26 MED ORDER — OXYCODONE HCL 5 MG PO TABS: 5.0000 mg | ORAL_TABLET | ORAL | Status: DC | PRN

## 2017-03-26 MED ORDER — IBUPROFEN 800 MG PO TABS: 800.0000 mg | ORAL_TABLET | Freq: Four times a day (QID) | ORAL | Status: DC

## 2017-03-26 MED ORDER — ONDANSETRON HCL 4 MG/2ML IJ SOLN: 4.0000 mg | Freq: Once | INTRAMUSCULAR | Status: DC | PRN

## 2017-03-26 MED ORDER — KETOROLAC TROMETHAMINE 30 MG/ML IJ SOLN
15.0000 mg | Freq: Four times a day (QID) | INTRAMUSCULAR | Status: DC
  Administered 2017-03-26 – 2017-03-27 (×3): 15 mg via INTRAVENOUS
  Filled 2017-03-26 (×3): qty 1

## 2017-03-26 MED ORDER — FAMOTIDINE 20 MG PO TABS
20.0000 mg | ORAL_TABLET | Freq: Two times a day (BID) | ORAL | Status: DC
  Administered 2017-03-26 – 2017-03-27 (×2): 20 mg via ORAL
  Filled 2017-03-26 (×2): qty 1

## 2017-03-26 MED ORDER — KETOROLAC TROMETHAMINE 30 MG/ML IJ SOLN: 30.0000 mg | Freq: Once | INTRAMUSCULAR | Status: DC | PRN

## 2017-03-26 MED ORDER — ONDANSETRON HCL 4 MG/2ML IJ SOLN
INTRAMUSCULAR | Status: AC
  Filled 2017-03-26: qty 2

## 2017-03-26 MED ORDER — FENTANYL CITRATE (PF) 250 MCG/5ML IJ SOLN
INTRAMUSCULAR | Status: AC
  Filled 2017-03-26: qty 5

## 2017-03-26 MED ORDER — MEPERIDINE HCL 50 MG/ML IJ SOLN: 6.2500 mg | INTRAMUSCULAR | Status: DC | PRN

## 2017-03-26 MED ORDER — ENOXAPARIN SODIUM 40 MG/0.4ML ~~LOC~~ SOLN
40.0000 mg | SUBCUTANEOUS | Status: AC
  Administered 2017-03-26: 40 mg via SUBCUTANEOUS
  Filled 2017-03-26: qty 0.4

## 2017-03-26 MED ORDER — GABAPENTIN 300 MG PO CAPS
600.0000 mg | ORAL_CAPSULE | Freq: Every day | ORAL | Status: DC
  Filled 2017-03-26: qty 2

## 2017-03-26 MED ORDER — ONDANSETRON HCL 4 MG PO TABS: 4.0000 mg | ORAL_TABLET | Freq: Four times a day (QID) | ORAL | Status: DC | PRN

## 2017-03-26 MED ORDER — FENTANYL CITRATE (PF) 100 MCG/2ML IJ SOLN
INTRAMUSCULAR | Status: AC
  Filled 2017-03-26: qty 2

## 2017-03-26 MED ORDER — ACETAMINOPHEN 325 MG PO TABS: 325.0000 mg | ORAL_TABLET | ORAL | Status: DC | PRN

## 2017-03-26 MED ORDER — DEXAMETHASONE SODIUM PHOSPHATE 10 MG/ML IJ SOLN
INTRAMUSCULAR | Status: AC
  Filled 2017-03-26: qty 1

## 2017-03-26 MED ORDER — PHENYLEPHRINE 40 MCG/ML (10ML) SYRINGE FOR IV PUSH (FOR BLOOD PRESSURE SUPPORT)
PREFILLED_SYRINGE | INTRAVENOUS | Status: AC
  Filled 2017-03-26: qty 10

## 2017-03-26 MED ORDER — SUGAMMADEX SODIUM 200 MG/2ML IV SOLN
INTRAVENOUS | Status: AC
  Filled 2017-03-26: qty 2

## 2017-03-26 MED ORDER — LEVETIRACETAM 500 MG PO TABS
1500.0000 mg | ORAL_TABLET | Freq: Two times a day (BID) | ORAL | Status: DC
  Administered 2017-03-26 – 2017-03-27 (×2): 1500 mg via ORAL
  Filled 2017-03-26 (×2): qty 3

## 2017-03-26 MED ORDER — FENTANYL CITRATE (PF) 100 MCG/2ML IJ SOLN
25.0000 ug | INTRAMUSCULAR | Status: DC | PRN
  Administered 2017-03-26 (×2): 50 ug via INTRAVENOUS

## 2017-03-26 MED ORDER — ACETAMINOPHEN 500 MG PO TABS
1000.0000 mg | ORAL_TABLET | ORAL | Status: AC
  Administered 2017-03-26: 1000 mg via ORAL
  Filled 2017-03-26: qty 2

## 2017-03-26 MED ORDER — CITALOPRAM HYDROBROMIDE 20 MG PO TABS
20.0000 mg | ORAL_TABLET | Freq: Every day | ORAL | Status: DC
  Administered 2017-03-27: 20 mg via ORAL
  Filled 2017-03-26: qty 1

## 2017-03-26 MED ORDER — STERILE WATER FOR INJECTION IJ SOLN
INTRAMUSCULAR | Status: AC
  Filled 2017-03-26: qty 10

## 2017-03-26 MED ORDER — SCOPOLAMINE 1 MG/3DAYS TD PT72
1.0000 | MEDICATED_PATCH | TRANSDERMAL | Status: DC
  Administered 2017-03-26: 1.5 mg via TRANSDERMAL
  Filled 2017-03-26: qty 1

## 2017-03-26 MED ORDER — LACTATED RINGERS IV SOLN
INTRAVENOUS | Status: DC
  Administered 2017-03-26 (×2): via INTRAVENOUS

## 2017-03-26 MED ORDER — DEXAMETHASONE SODIUM PHOSPHATE 4 MG/ML IJ SOLN
INTRAMUSCULAR | Status: DC | PRN
  Administered 2017-03-26: 10 mg via INTRAVENOUS

## 2017-03-26 MED ORDER — ACETAMINOPHEN 500 MG PO TABS
1000.0000 mg | ORAL_TABLET | Freq: Four times a day (QID) | ORAL | Status: DC
  Administered 2017-03-26 – 2017-03-27 (×3): 1000 mg via ORAL
  Filled 2017-03-26 (×3): qty 2

## 2017-03-26 MED ORDER — DEXAMETHASONE SODIUM PHOSPHATE 4 MG/ML IJ SOLN: 4.0000 mg | INTRAMUSCULAR | Status: DC

## 2017-03-26 MED ORDER — SENNOSIDES-DOCUSATE SODIUM 8.6-50 MG PO TABS
2.0000 | ORAL_TABLET | Freq: Every day | ORAL | Status: DC
  Administered 2017-03-26: 2 via ORAL
  Filled 2017-03-26: qty 2

## 2017-03-26 MED ORDER — ACETAMINOPHEN 160 MG/5ML PO SOLN: 325.0000 mg | ORAL | Status: DC | PRN

## 2017-03-26 MED ORDER — DULOXETINE HCL 60 MG PO CPEP
120.0000 mg | ORAL_CAPSULE | Freq: Every day | ORAL | Status: DC
  Administered 2017-03-27: 120 mg via ORAL
  Filled 2017-03-26: qty 2

## 2017-03-26 MED ORDER — CELECOXIB 200 MG PO CAPS
400.0000 mg | ORAL_CAPSULE | ORAL | Status: AC
  Administered 2017-03-26: 400 mg via ORAL
  Filled 2017-03-26: qty 2

## 2017-03-26 MED ORDER — GLYCOPYRROLATE 0.2 MG/ML IV SOSY
PREFILLED_SYRINGE | INTRAVENOUS | Status: DC | PRN
  Administered 2017-03-26: .3 mg via INTRAVENOUS

## 2017-03-26 MED ORDER — ROCURONIUM BROMIDE 100 MG/10ML IV SOLN
INTRAVENOUS | Status: DC | PRN
  Administered 2017-03-26: 50 mg via INTRAVENOUS

## 2017-03-26 MED ORDER — STERILE WATER FOR IRRIGATION IR SOLN
Status: DC | PRN
  Administered 2017-03-26: 1000 mL

## 2017-03-26 MED ORDER — ONDANSETRON HCL 4 MG/2ML IJ SOLN: 4.0000 mg | Freq: Four times a day (QID) | INTRAMUSCULAR | Status: DC | PRN

## 2017-03-26 MED ORDER — HYDROMORPHONE HCL 1 MG/ML IJ SOLN: 0.2000 mg | INTRAMUSCULAR | Status: DC | PRN

## 2017-03-26 MED ORDER — ENOXAPARIN SODIUM 40 MG/0.4ML ~~LOC~~ SOLN
40.0000 mg | SUBCUTANEOUS | Status: DC
  Administered 2017-03-27: 40 mg via SUBCUTANEOUS
  Filled 2017-03-26: qty 0.4

## 2017-03-26 MED ORDER — PROPOFOL 10 MG/ML IV BOLUS
INTRAVENOUS | Status: DC | PRN
  Administered 2017-03-26: 120 mg via INTRAVENOUS

## 2017-03-26 MED ORDER — CEFAZOLIN SODIUM-DEXTROSE 2-4 GM/100ML-% IV SOLN
2.0000 g | INTRAVENOUS | Status: AC
  Administered 2017-03-26: 2 g via INTRAVENOUS
  Filled 2017-03-26: qty 100

## 2017-03-26 MED ORDER — PHENYLEPHRINE 40 MCG/ML (10ML) SYRINGE FOR IV PUSH (FOR BLOOD PRESSURE SUPPORT)
PREFILLED_SYRINGE | INTRAVENOUS | Status: DC | PRN
  Administered 2017-03-26 (×4): 80 ug via INTRAVENOUS

## 2017-03-26 MED ORDER — FENTANYL CITRATE (PF) 100 MCG/2ML IJ SOLN
INTRAMUSCULAR | Status: DC | PRN
  Administered 2017-03-26 (×2): 50 ug via INTRAVENOUS

## 2017-03-26 MED ORDER — MIDAZOLAM HCL 2 MG/2ML IJ SOLN
INTRAMUSCULAR | Status: AC
  Filled 2017-03-26: qty 2

## 2017-03-26 MED ORDER — STERILE WATER FOR INJECTION IJ SOLN
INTRAMUSCULAR | Status: DC | PRN
  Administered 2017-03-26: 10 mL

## 2017-03-26 MED ORDER — ROCURONIUM BROMIDE 10 MG/ML (PF) SYRINGE
PREFILLED_SYRINGE | INTRAVENOUS | Status: AC
  Filled 2017-03-26: qty 5

## 2017-03-26 MED ORDER — ONDANSETRON HCL 4 MG/2ML IJ SOLN
INTRAMUSCULAR | Status: DC | PRN
  Administered 2017-03-26: 4 mg via INTRAVENOUS

## 2017-03-26 MED ORDER — LIDOCAINE 2% (20 MG/ML) 5 ML SYRINGE
INTRAMUSCULAR | Status: DC | PRN
  Administered 2017-03-26: 60 mg via INTRAVENOUS

## 2017-03-26 MED ORDER — SUGAMMADEX SODIUM 200 MG/2ML IV SOLN
INTRAVENOUS | Status: DC | PRN
  Administered 2017-03-26: 300 mg via INTRAVENOUS

## 2017-03-26 MED ORDER — MIDAZOLAM HCL 5 MG/5ML IJ SOLN
INTRAMUSCULAR | Status: DC | PRN
  Administered 2017-03-26: 2 mg via INTRAVENOUS

## 2017-03-26 MED ORDER — GABAPENTIN 300 MG PO CAPS
300.0000 mg | ORAL_CAPSULE | ORAL | Status: AC
  Administered 2017-03-26: 300 mg via ORAL
  Filled 2017-03-26: qty 1

## 2017-03-26 SURGICAL SUPPLY — 43 items
APPLICATOR SURGIFLO ENDO (HEMOSTASIS) IMPLANT
BAG LAPAROSCOPIC 12 15 PORT 16 (BASKET) IMPLANT
BAG RETRIEVAL 12/15 (BASKET)
COVER BACK TABLE 60X90IN (DRAPES) ×2 IMPLANT
COVER TIP SHEARS 8 DVNC (MISCELLANEOUS) ×1 IMPLANT
COVER TIP SHEARS 8MM DA VINCI (MISCELLANEOUS) ×1
DERMABOND ADVANCED (GAUZE/BANDAGES/DRESSINGS) ×1
DERMABOND ADVANCED .7 DNX12 (GAUZE/BANDAGES/DRESSINGS) ×1 IMPLANT
DRAPE ARM DVNC X/XI (DISPOSABLE) ×4 IMPLANT
DRAPE COLUMN DVNC XI (DISPOSABLE) ×1 IMPLANT
DRAPE DA VINCI XI ARM (DISPOSABLE) ×4
DRAPE DA VINCI XI COLUMN (DISPOSABLE) ×1
DRAPE SHEET LG 3/4 BI-LAMINATE (DRAPES) ×2 IMPLANT
DRAPE SURG IRRIG POUCH 19X23 (DRAPES) ×2 IMPLANT
ELECT REM PT RETURN 15FT ADLT (MISCELLANEOUS) ×2 IMPLANT
GLOVE BIO SURGEON STRL SZ 6 (GLOVE) ×8 IMPLANT
GLOVE BIO SURGEON STRL SZ 6.5 (GLOVE) ×4 IMPLANT
GOWN STRL REUS W/ TWL LRG LVL3 (GOWN DISPOSABLE) ×3 IMPLANT
GOWN STRL REUS W/TWL LRG LVL3 (GOWN DISPOSABLE) ×3
HOLDER FOLEY CATH W/STRAP (MISCELLANEOUS) ×2 IMPLANT
IRRIG SUCT STRYKERFLOW 2 WTIP (MISCELLANEOUS) ×2
IRRIGATION SUCT STRKRFLW 2 WTP (MISCELLANEOUS) ×1 IMPLANT
KIT PROCEDURE DA VINCI SI (MISCELLANEOUS) ×1
KIT PROCEDURE DVNC SI (MISCELLANEOUS) ×1 IMPLANT
MANIPULATOR UTERINE 4.5 ZUMI (MISCELLANEOUS) ×2 IMPLANT
NEEDLE SPNL 18GX3.5 QUINCKE PK (NEEDLE) ×2 IMPLANT
OBTURATOR OPTICAL STANDARD 8MM (TROCAR) ×1
OBTURATOR OPTICAL STND 8 DVNC (TROCAR) ×1
OBTURATOR OPTICALSTD 8 DVNC (TROCAR) ×1 IMPLANT
PACK ROBOT GYN CUSTOM WL (TRAY / TRAY PROCEDURE) ×2 IMPLANT
PAD POSITIONING PINK XL (MISCELLANEOUS) ×2 IMPLANT
POUCH SPECIMEN RETRIEVAL 10MM (ENDOMECHANICALS) IMPLANT
SEAL CANN UNIV 5-8 DVNC XI (MISCELLANEOUS) ×4 IMPLANT
SEAL XI 5MM-8MM UNIVERSAL (MISCELLANEOUS) ×4
SET TRI-LUMEN FLTR TB AIRSEAL (TUBING) ×2 IMPLANT
SURGIFLO W/THROMBIN 8M KIT (HEMOSTASIS) IMPLANT
SUT VIC AB 0 CT1 27 (SUTURE)
SUT VIC AB 0 CT1 27XBRD ANTBC (SUTURE) IMPLANT
SYR 10ML LL (SYRINGE) ×2 IMPLANT
TOWEL OR NON WOVEN STRL DISP B (DISPOSABLE) ×2 IMPLANT
TRAP SPECIMEN MUCOUS 40CC (MISCELLANEOUS) IMPLANT
TRAY FOLEY W/METER SILVER 16FR (SET/KITS/TRAYS/PACK) ×2 IMPLANT
UNDERPAD 30X30 (UNDERPADS AND DIAPERS) ×2 IMPLANT

## 2017-03-26 NOTE — Anesthesia Procedure Notes (Signed)
Procedure Name: Intubation Date/Time: 03/26/2017 11:02 AM Performed by: Lieutenant Diego, CRNA Pre-anesthesia Checklist: Patient identified, Emergency Drugs available, Suction available and Patient being monitored Patient Re-evaluated:Patient Re-evaluated prior to induction Oxygen Delivery Method: Circle system utilized Preoxygenation: Pre-oxygenation with 100% oxygen Induction Type: IV induction Ventilation: Mask ventilation without difficulty Laryngoscope Size: Miller and 2 Grade View: Grade I Tube type: Oral Tube size: 7.0 mm Number of attempts: 1 Airway Equipment and Method: Stylet and Oral airway Placement Confirmation: ETT inserted through vocal cords under direct vision,  positive ETCO2 and breath sounds checked- equal and bilateral Secured at: 22 cm Tube secured with: Tape Dental Injury: Teeth and Oropharynx as per pre-operative assessment

## 2017-03-26 NOTE — Discharge Instructions (Signed)
03/26/2017  Return to work: 4 weeks  Activity: 1. Be up and out of the bed during the day.  Take a nap if needed.  You may walk up steps but be careful and use the hand rail.  Stair climbing will tire you more than you think, you may need to stop part way and rest.   2. No lifting or straining for 6 weeks.  3. No driving for 1 weeks.  Do Not drive if you are taking narcotic pain medicine.  4. Shower daily.  Use soap and water on your incision and pat dry; don't rub.   5. No sexual activity and nothing in the vagina for 8 weeks.  Medications:  - Take ibuprofen and tylenol first line for pain control. Take these regularly (every 6 hours) to decrease the build up of pain.  - If necessary, for severe pain not relieved by ibuprofen, take percocet.  - While taking percocet you should take sennakot every night to reduce the likelihood of constipation. If this causes diarrhea, stop its use.  Diet: 1. Low sodium Heart Healthy Diet is recommended.  2. It is safe to use a laxative if you have difficulty moving your bowels.   Wound Care: 1. Keep clean and dry.  Shower daily.  Reasons to call the Doctor:   Fever - Oral temperature greater than 100.4 degrees Fahrenheit  Foul-smelling vaginal discharge  Difficulty urinating  Nausea and vomiting  Increased pain at the site of the incision that is unrelieved with pain medicine.  Difficulty breathing with or without chest pain  New calf pain especially if only on one side  Sudden, continuing increased vaginal bleeding with or without clots.   Follow-up: 1. See Everitt Amber in 3 weeks.  Contacts: For questions or concerns you should contact:  Dr. Everitt Amber at [phone] After hours and on week-ends call [phone] and ask to speak to the physician on call for Gynecologic Oncology

## 2017-03-26 NOTE — Addendum Note (Signed)
Addendum  created 03/26/17 1323 by Sharlette Dense, CRNA   Intraprocedure Attestations filed

## 2017-03-26 NOTE — Transfer of Care (Signed)
Immediate Anesthesia Transfer of Care Note  Patient: Rebekah [name]  Procedure(s) Performed: XI ROBOTIC ASSISTED TOTAL HYSTERECTOMY WITH BILATERAL SALPINGO OOPHORECTOMY AND SENTINAL LYMPH NODES (Bilateral )  Patient Location: PACU  Anesthesia Type:General  Level of Consciousness: awake  Airway & Oxygen Therapy: Patient Spontanous Breathing and Patient connected to face mask oxygen  Post-op Assessment: Report given to RN and Post -op Vital signs reviewed and stable  Post vital signs: Reviewed and stable  Last Vitals:  Vitals:   03/26/17 0900  BP: 127/84  Pulse: (!) 101  Resp: 16  Temp: 36.9 C  SpO2: 92%    Last Pain:  Vitals:   03/26/17 0900  TempSrc: Oral         Complications: No apparent anesthesia complications

## 2017-03-26 NOTE — Op Note (Signed)
OPERATIVE NOTE 03/26/17  Surgeon: Donaciano Eva   Assistants: Dr Lahoma Crocker (an MD assistant was necessary for tissue manipulation, management of robotic instrumentation, retraction and positioning due to the complexity of the case and hospital policies).   Anesthesia: General endotracheal anesthesia  ASA Class: 3   Pre-operative Diagnosis: endometrial cancer grade 1  Post-operative Diagnosis: same,   Operation: Robotic-assisted laparoscopic total hysterectomy with bilateral salpingoophorectomy, SLN biopsy   Surgeon: Donaciano Eva  Assistant Surgeon: Lahoma Crocker MD  Anesthesia: GET  Urine Output: 300cc  Operative Findings:  : normal 7cm uterus, tubes and ovaries, normal appearing nodes, no apparent extrauterine disease.  Estimated Blood Loss:  <20cc      Total IV Fluids: 800 ml         Specimens: uterus, cervix, bilateral tubes and ovaries, left and right SLN          Complications:  None; patient tolerated the procedure well.         Disposition: PACU - hemodynamically stable.  Procedure Details  The patient was seen in the Holding Room. The risks, benefits, complications, treatment options, and expected outcomes were discussed with the patient.  The patient concurred with the proposed plan, giving informed consent.  The site of surgery properly noted/marked. The patient was identified as Rebekah [name] and the procedure verified as a Robotic-assisted hysterectomy with bilateral salpingo oophorectomy with SLN biopsy. A Time Out was held and the above information confirmed.  After induction of anesthesia, the patient was draped and prepped in the usual sterile manner. Pt was placed in supine position after anesthesia and draped and prepped in the usual sterile manner. The abdominal drape was placed after the CholoraPrep had been allowed to dry for 3 minutes.  Her arms were tucked to her side with all appropriate precautions.  The shoulders  were stabilized with padded shoulder blocks applied to the acromium processes.  The patient was placed in the semi-lithotomy position in [place].  The perineum was prepped with Betadine. The patient was then prepped. Foley catheter was placed.  A sterile speculum was placed in the vagina.  The cervix was grasped with a single-tooth tenaculum. 2mg  total of ICG was injected into the cervical stroma at 2 and 9 o'clock with 1cc injected at a 1cm and 109mm depth (concentration 0.5mg /ml) in all locations. The cervix was dilated with Kennon Rounds dilators.  The ZUMI uterine manipulator with a medium colpotomizer ring was placed without difficulty.  A pneum occluder balloon was placed over the manipulator.  OG tube placement was confirmed and to suction.   Next, a 5 mm skin incision was made 1 cm below the subcostal margin in the midclavicular line.  The 5 mm Optiview port and scope was used for direct entry.  Opening pressure was under 10 mm CO2.  The abdomen was insufflated and the findings were noted as above.   At this point and all points during the procedure, the patient's intra-abdominal pressure did not exceed 15 mmHg. Next, a 10 mm skin incision was made in the umbilicus and a right and left port was placed about 10 cm lateral to the robot port on the right and left side.  A fourth arm was placed in the left lower quadrant 2 cm above and superior and medial to the anterior superior iliac spine.  All ports were placed under direct visualization.  The patient was placed in steep Trendelenburg.  Bowel was folded away into the upper abdomen.  The  robot was docked in the normal manner.  The right and left peritoneum were opened parallel to the IP ligament to open the retroperitoneal spaces bilaterally. The SLN mapping was performed in bilateral pelvic basins. The para rectal and paravesical spaces were opened up entirely with careful dissection below the level of the ureters bilaterally and to the depth of the uterine  artery origin in order to skeletonize the uterine "web" and ensure visualization of all parametrial channels. The para-aortic basins were carefully exposed and evaluated for isolated para-aortic SLN's. Lymphatic channels were identified travelling to the following visualized sentinel lymph node's: left and right external iliac SLN. These SLN's were separated from their surrounding lymphatic tissue, removed and sent for permanent pathology.  The hysterectomy was started after the round ligament on the right side was incised and the retroperitoneum was entered and the pararectal space was developed.  The ureter was noted to be on the medial leaf of the broad ligament.  The peritoneum above the ureter was incised and stretched and the infundibulopelvic ligament was skeletonized, cauterized and cut.  The posterior peritoneum was taken down to the level of the KOH ring.  The anterior peritoneum was also taken down.  The bladder flap was created to the level of the KOH ring.  The uterine artery on the right side was skeletonized, cauterized and cut in the normal manner.  A similar procedure was performed on the left.  The colpotomy was made and the uterus, cervix, bilateral ovaries and tubes were amputated and delivered through the vagina.  Pedicles were inspected and excellent hemostasis was achieved.    The colpotomy at the vaginal cuff was closed with Vicryl on a CT1 needle in a running manner.  Irrigation was used and excellent hemostasis was achieved.  At this point in the procedure was completed.  Robotic instruments were removed under direct visulaization.  The robot was undocked. The 10 mm ports were closed with Vicryl on a UR-5 needle and the fascia was closed with 0 Vicryl on a UR-5 needle.  The skin was closed with 4-0 Vicryl in a subcuticular manner.  Dermabond was applied.  Sponge, lap and needle counts correct x 2.  The patient was taken to the recovery room in stable condition.  The vagina was swabbed  with  minimal bleeding noted.   All instrument and needle counts were correct x  3.   The patient was transferred to the recovery room in a stable condition.  Donaciano Eva, MD

## 2017-03-26 NOTE — Interval H&P Note (Signed)
History and Physical Interval Note:  03/26/2017 10:12 AM  Rebekah [name]  has presented today for surgery, with the diagnosis of endometrial cancer  The various methods of treatment have been discussed with the patient and family. After consideration of risks, benefits and other options for treatment, the patient has consented to  Procedure(s): XI ROBOTIC ASSISTED TOTAL HYSTERECTOMY WITH BILATERAL SALPINGO OOPHORECTOMY AND SENTINAL LYMPH NODES (Bilateral) as a surgical intervention .  The patient's history has been reviewed, patient examined, no change in status, stable for surgery.  I have reviewed the patient's chart and labs.  Questions were answered to the patient's satisfaction.     Thereasa Solo

## 2017-03-26 NOTE — Anesthesia Postprocedure Evaluation (Signed)
Anesthesia Post Note  Patient: Rebekah [name]  Procedure(s) Performed: XI ROBOTIC ASSISTED TOTAL HYSTERECTOMY WITH BILATERAL SALPINGO OOPHORECTOMY AND SENTINAL LYMPH NODES (Bilateral )     Patient location during evaluation: PACU Anesthesia Type: General Level of consciousness: awake and alert Pain management: pain level controlled Vital Signs Assessment: post-procedure vital signs reviewed and stable Respiratory status: spontaneous breathing, nonlabored ventilation, respiratory function stable and patient connected to nasal cannula oxygen Cardiovascular status: blood pressure returned to baseline and stable Postop Assessment: no apparent nausea or vomiting Anesthetic complications: no    Last Vitals:  Vitals:   03/26/17 0900 03/26/17 1237  BP: 127/84 116/72  Pulse: (!) 101 84  Resp: 16 11  Temp: 36.9 C 36.9 C  SpO2: 92% 97%    Last Pain:  Vitals:   03/26/17 1237  TempSrc:   PainSc: 0-No pain                 [name]

## 2017-03-27 ENCOUNTER — Ambulatory Visit: Payer: Self-pay | Admitting: Gynecologic Oncology

## 2017-03-27 DIAGNOSIS — C530 Malignant neoplasm of endocervix: Secondary | ICD-10-CM | POA: Diagnosis not present

## 2017-03-27 DIAGNOSIS — F329 Major depressive disorder, single episode, unspecified: Secondary | ICD-10-CM | POA: Diagnosis not present

## 2017-03-27 DIAGNOSIS — C5701 Malignant neoplasm of right fallopian tube: Secondary | ICD-10-CM | POA: Diagnosis not present

## 2017-03-27 DIAGNOSIS — G40909 Epilepsy, unspecified, not intractable, without status epilepticus: Secondary | ICD-10-CM | POA: Diagnosis not present

## 2017-03-27 DIAGNOSIS — C542 Malignant neoplasm of myometrium: Secondary | ICD-10-CM | POA: Diagnosis not present

## 2017-03-27 DIAGNOSIS — C779 Secondary and unspecified malignant neoplasm of lymph node, unspecified: Secondary | ICD-10-CM | POA: Diagnosis not present

## 2017-03-27 DIAGNOSIS — Z803 Family history of malignant neoplasm of breast: Secondary | ICD-10-CM | POA: Diagnosis not present

## 2017-03-27 DIAGNOSIS — Z79899 Other long term (current) drug therapy: Secondary | ICD-10-CM | POA: Diagnosis not present

## 2017-03-27 DIAGNOSIS — F419 Anxiety disorder, unspecified: Secondary | ICD-10-CM | POA: Diagnosis not present

## 2017-03-27 LAB — CBC
HEMATOCRIT: 39.4 % (ref 36.0–46.0)
HEMOGLOBIN: 13 g/dL (ref 12.0–15.0)
MCH: 32.3 pg (ref 26.0–34.0)
MCHC: 33 g/dL (ref 30.0–36.0)
MCV: 97.8 fL (ref 78.0–100.0)
Platelets: 248 10*3/uL (ref 150–400)
RBC: 4.03 MIL/uL (ref 3.87–5.11)
RDW: 12.7 % (ref 11.5–15.5)
WBC: 7.4 10*3/uL (ref 4.0–10.5)

## 2017-03-27 LAB — BASIC METABOLIC PANEL
Anion gap: 8 (ref 5–15)
BUN: 8 mg/dL (ref 6–20)
CHLORIDE: 103 mmol/L (ref 101–111)
CO2: 27 mmol/L (ref 22–32)
Calcium: 8.8 mg/dL — ABNORMAL LOW (ref 8.9–10.3)
Creatinine, Ser: 0.85 mg/dL (ref 0.44–1.00)
GFR calc Af Amer: >60 mL/min (ref >60)
GFR calc non Af Amer: >60 mL/min (ref >60)
GLUCOSE: 129 mg/dL — AB (ref 65–99)
POTASSIUM: 3.9 mmol/L (ref 3.5–5.1)
Sodium: 138 mmol/L (ref 135–145)

## 2017-03-27 MED ORDER — IBUPROFEN 800 MG PO TABS: 800.0000 mg | ORAL_TABLET | Freq: Four times a day (QID) | ORAL | 0 refills | Status: DC

## 2017-03-27 MED ORDER — SENNOSIDES-DOCUSATE SODIUM 8.6-50 MG PO TABS
2.0000 | ORAL_TABLET | Freq: Every day | ORAL | 1 refills | Status: DC
Start: 2017-03-27 — End: 2017-04-19

## 2017-03-27 MED ORDER — OXYCODONE HCL 5 MG PO TABS: 5.0000 mg | ORAL_TABLET | ORAL | 0 refills | Status: DC | PRN

## 2017-03-27 NOTE — Discharge Summary (Signed)
Physician Discharge Summary  Patient ID: Rebekah [name] MRN: [id number] DOB/AGE: 1955-05-19 62 y.o.  Admit date: 03/26/2017 Discharge date: 03/27/2017  Admission Diagnoses: Endocervical adenocarcinoma [facility])  Discharge Diagnoses:  Principal Problem:   Endocervical adenocarcinoma [facility]) Active Problems:   Endometrial cancer [facility])   Discharged Condition: good  Hospital Course:  1/ patient was admitted on 03/26/17 for a robotic hysterectomy, BSO, SLN biopsy for endoemtrial cancer 2/ surgery was uncomplicated  3/ on postoperative day 1 the patient was meeting discharge criteria: tolerating PO, voiding urine, ambulating, pain well controlled on oral medications.  4/ new medications on discharge include motrin, percocet, senakot.    Consults: None  Significant Diagnostic Studies: labs:  CBC    Component Value Date/Time   WBC 7.4 03/27/2017 0509   RBC 4.03 03/27/2017 0509   HGB 13.0 03/27/2017 0509   HGB 14.4 11/07/2016 1551   HCT 39.4 03/27/2017 0509   HCT 43.0 11/07/2016 1551   PLT 248 03/27/2017 0509   PLT 309 11/07/2016 1551   MCV 97.8 03/27/2017 0509   MCV 96 11/07/2016 1551   MCH 32.3 03/27/2017 0509   MCHC 33.0 03/27/2017 0509   RDW 12.7 03/27/2017 0509   RDW 13.7 11/07/2016 1551   LYMPHSABS 2.0 11/07/2016 1551   MONOABS 517 09/21/2015 1026   EOSABS 0.1 11/07/2016 1551   BASOSABS 0.0 11/07/2016 1551     Treatments: surgery: see above  Discharge Exam: Blood pressure 94/65, pulse 81, temperature 98 F (36.7 C), temperature source Oral, resp. rate 16, height 4\' 11"  (1.499 m), weight 169 lb (76.7 kg), SpO2 96 %. GI: soft, non-tender; bowel sounds normal; no masses,  no organomegaly Incision/Wound: clean, dry and in tact with dermabond x 5  Disposition: Final discharge disposition not confirmed   Allergies as of 03/27/2017      Reactions   Ambien [zolpidem Tartrate] Other (See Comments)   Other reaction(s): Hallucination   Soma [carisoprodol] Other  (See Comments)   chills      Medication List    TAKE these medications   citalopram 20 MG tablet Commonly known as:  CELEXA Take 1 tablet (20 mg total) by mouth daily.   diphenhydramine-acetaminophen 25-500 MG Tabs tablet Commonly known as:  TYLENOL PM Take 1 tablet by mouth at bedtime.   DULoxetine 60 MG capsule Commonly known as:  CYMBALTA Take 2 capsules (120 mg total) by mouth daily. What changed:    how much to take  when to take this   ibuprofen 800 MG tablet Commonly known as:  ADVIL,MOTRIN Take 1 tablet (800 mg total) by mouth every 6 (six) hours.   levETIRAcetam 1000 MG tablet Commonly known as:  KEPPRA Take 1,500 mg by mouth 2 (two) times daily.   multivitamin capsule Take 1 capsule by mouth daily.   oxyCODONE 5 MG immediate release tablet Commonly known as:  Oxy IR/ROXICODONE Take 1 tablet (5 mg total) by mouth every 4 (four) hours as needed for severe pain or breakthrough pain.   ranitidine 150 MG capsule Commonly known as:  ZANTAC Take 150 mg by mouth daily.   senna-docusate 8.6-50 MG tablet Commonly known as:  Senokot-S Take 2 tablets by mouth at bedtime.      Follow-up Information    Everitt Amber, MD Follow up.   Specialty:  Obstetrics and Gynecology Contact information: [address] [place] [address] [phone]           Signed: Thereasa Solo 03/27/2017, 8:55 AM

## 2017-03-27 NOTE — Progress Notes (Signed)
Discharge instructions given. Pt verbalized understanding and all questions were answered.  

## 2017-04-01 ENCOUNTER — Other Ambulatory Visit: Payer: Self-pay | Admitting: Neurology

## 2017-04-01 ENCOUNTER — Encounter: Payer: Self-pay | Admitting: Family Medicine

## 2017-04-02 ENCOUNTER — Telehealth: Payer: Self-pay | Admitting: *Deleted

## 2017-04-02 DIAGNOSIS — Z0271 Encounter for disability determination: Secondary | ICD-10-CM

## 2017-04-02 NOTE — Telephone Encounter (Addendum)
Phone call to pharmacy, spoke with [place]. He states medication had been transferred to [facility] in [place], but has not been refilled. Medication pulled back to [facility] on [address].

## 2017-04-02 NOTE — Telephone Encounter (Signed)
Returned the patient's call. Patient stated that " I am having pressure when I stand up, and after I sit down. It is a pinching/stab pain. The pain is in the front pelvic are all across." Patient did state that " I am lifting my grand baby up just to put her in the carrier or the swing. I'm not carrying her or the baby carrier."  Patient denies any odor, consisted pain, fever, spotting, bleeding or discharge.  Advised the patient that we will need to have her come in the office to be seen to check the surgery sites, and that I will call her tomorrow with an appt. Patient verbalized understanding.

## 2017-04-02 NOTE — Telephone Encounter (Signed)
Attempted to return the patient's call, left a message to call the office back. Patient left a message stating " Is it normal to have a pressure feeling in the belly after surgery."

## 2017-04-03 ENCOUNTER — Telehealth: Payer: Self-pay | Admitting: *Deleted

## 2017-04-03 NOTE — Telephone Encounter (Signed)
Called pt and discussed our receipt of refill request for Keppra. Pt stated that she has enough left for the week. She is aware that refills are to be given by Dr. Amalia Hailey' office. She will call his office today to request a refill of Keppra. She verbalized appreciation & understanding.

## 2017-04-03 NOTE — Telephone Encounter (Signed)
Called and left the patient a message with the appt time/date of March 25th at 2pm

## 2017-04-05 ENCOUNTER — Telehealth: Payer: Self-pay | Admitting: Gynecologic Oncology

## 2017-04-05 DIAGNOSIS — C541 Malignant neoplasm of endometrium: Secondary | ICD-10-CM

## 2017-04-05 NOTE — Telephone Encounter (Signed)
Informed patient and her daughter of stage IIIC1 endometrial cancer. REcommendation is for chemotherapy x 6 cycles and vaginal brachytherapy. Will order CT chest/abd/pelvis. We will facilitate referral to medical oncology and radiation oncology. All questions answered.  Thereasa Solo, MD

## 2017-04-08 ENCOUNTER — Encounter: Payer: Self-pay | Admitting: Gynecology

## 2017-04-08 ENCOUNTER — Encounter: Payer: Self-pay | Admitting: Radiation Oncology

## 2017-04-08 ENCOUNTER — Inpatient Hospital Stay: Payer: BLUE CROSS/BLUE SHIELD | Attending: Gynecologic Oncology | Admitting: Gynecology

## 2017-04-08 VITALS — BP 138/96 | HR 95 | Temp 98.4°F | Resp 20 | Wt 167.5 lb

## 2017-04-08 DIAGNOSIS — N898 Other specified noninflammatory disorders of vagina: Secondary | ICD-10-CM | POA: Insufficient documentation

## 2017-04-08 DIAGNOSIS — C541 Malignant neoplasm of endometrium: Secondary | ICD-10-CM

## 2017-04-08 DIAGNOSIS — Z90722 Acquired absence of ovaries, bilateral: Secondary | ICD-10-CM | POA: Diagnosis not present

## 2017-04-08 DIAGNOSIS — Z90710 Acquired absence of both cervix and uterus: Secondary | ICD-10-CM | POA: Insufficient documentation

## 2017-04-08 NOTE — Progress Notes (Signed)
Consult Note: Gyn-Onc   Rebekah [name] 62 y.o. female  Chief Complaint  Patient presents with  . Post-op Problem    Pain    Assessment : Vaginal spotting secondary to suture dissolving along the vaginal cuff.  Plan: Silver nitrate is applied to the vaginal cuff and bleeding has stopped.  Patient is reassured regarding this issue.  She will return to see Dr. Denman George as previously scheduled.  HPI: Patient presents today now approximately 2 weeks since undergoing robotic assisted hysterectomy bilateral salpingo-nephrectomy and pelvic lymphadenectomy.  Over the last several days she has had a vaginal discharge which is somewhat pink.  She denies any fever or chills.  She has no other GI or GU symptoms.  Review of Systems:10 point review of systems is negative except as noted in interval history.   Vitals: Blood pressure (!) 138/96, pulse 95, temperature 98.4 F (36.9 C), temperature source Oral, resp. rate 20, weight 167 lb 8 oz (76 kg), SpO2 98 %.  Physical Exam: General : The patient is a healthy woman in no acute distress.  HEENT: normocephalic, extraoccular movements normal; neck is supple without thyromegally  Lynphnodes: Supraclavicular and inguinal nodes not enlarged  Abdomen: Soft, non-tender, no ascites, no organomegally, no masses, no hernias  Pelvic:  EGBUS: Normal female  Vagina: Normal, no lesions, sutures are identified at the vaginal cuff.  There is some areas that are bleeding slightly and silver nitrate is applied.  Lower extremities: No edema or varicosities. Normal range of motion      Allergies  Allergen Reactions  . Ambien [Zolpidem Tartrate] Other (See Comments)    Other reaction(s): Hallucination  . Soma [Carisoprodol] Other (See Comments)    chills    Past Medical History:  Diagnosis Date  . Anxiety   . Arthritis   . Asthma    NO INHALERS USED  . Cancer ([place])    ENDOMETRIAL  . Depression   . Fibromyalgia   . GERD (gastroesophageal reflux  disease)   . Insomnia   . Seizure disorder ([place])    LAST SEIZURE FEBRUARY 2019 SEES [facility]   . Seizures ([place])     Past Surgical History:  Procedure Laterality Date  . BREAST BIOPSY Right    biopsy in the 1990's  . BUNIONECTOMY Bilateral    2000's - R ONCE and L once  . CARPAL TUNNEL RELEASE Bilateral    2000's R X2 LEFT X 1  . COLONSCOPY    . ENDOMETRIAL BIOPSY  02/2017  . ROBOTIC ASSISTED TOTAL HYSTERECTOMY WITH BILATERAL SALPINGO OOPHERECTOMY Bilateral 03/26/2017   Procedure: XI ROBOTIC ASSISTED TOTAL HYSTERECTOMY WITH BILATERAL SALPINGO OOPHORECTOMY AND SENTINAL LYMPH NODES;  Surgeon: Everitt Amber, MD;  Location: [facility] ORS;  Service: Gynecology;  Laterality: Bilateral;  . TUBAL LIGATION  1984    Current Outpatient Medications  Medication Sig Dispense Refill  . citalopram (CELEXA) 20 MG tablet Take 1 tablet (20 mg total) by mouth daily. 10 tablet 0  . diphenhydramine-acetaminophen (TYLENOL PM) 25-500 MG TABS tablet Take 1 tablet by mouth at bedtime.    . DULoxetine (CYMBALTA) 60 MG capsule Take 2 capsules (120 mg total) by mouth daily. (Patient taking differently: Take 60 mg by mouth 2 (two) times daily. ) 180 capsule 0  . ibuprofen (ADVIL,MOTRIN) 800 MG tablet Take 1 tablet (800 mg total) by mouth every 6 (six) hours. 30 tablet 0  . levETIRAcetam (KEPPRA) 1000 MG tablet Take 1,500 mg by mouth 2 (two) times daily.    Marland Kitchen  Multiple Vitamin (MULTIVITAMIN) capsule Take 1 capsule by mouth daily.    Marland Kitchen oxyCODONE (OXY IR/ROXICODONE) 5 MG immediate release tablet Take 1 tablet (5 mg total) by mouth every 4 (four) hours as needed for severe pain or breakthrough pain. 20 tablet 0  . ranitidine (ZANTAC) 150 MG capsule Take 150 mg by mouth daily.    Marland Kitchen senna-docusate (SENOKOT-S) 8.6-50 MG tablet Take 2 tablets by mouth at bedtime. 30 tablet 1   No current facility-administered medications for this visit.     Social History   Socioeconomic History  . Marital status: Married     Spouse name: Not on file  . Number of children: 4  . Years of education: 8  . Highest education level: Not on file  Occupational History  . 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 Financial resource strain: Not on file  . Food insecurity:    Worry: Not on file    Inability: Not on file  . Transportation needs:    Medical: Not on file    Non-medical: Not on file  Tobacco Use  . Smoking status: Never Smoker  . Smokeless tobacco: Never Used  Substance and Sexual Activity  . Alcohol use: No    Alcohol/week: 0.0 oz  . Drug use: No  . Sexual activity: Not Currently    Partners: Male    Birth control/protection: Post-menopausal  Lifestyle  . Physical activity:    Days per week: Not on file    Minutes per session: Not on file  . Stress: Not on file  Relationships  . Social connections:    Talks on phone: Not on file    Gets together: Not on file    Attends religious service: Not on file    Active member of club or organization: Not on file    Attends meetings of clubs or organizations: Not on file    Relationship status: Not on file  . Intimate partner violence:    Fear of current or ex partner: Not on file    Emotionally abused: Not on file    Physically abused: Not on file    Forced sexual activity: Not on file  Other Topics Concern  . Not on file  Social History Narrative   Lives with daughter, [name]   Caffeine use: coffee/tea/soda daily    Family History  Problem Relation Age of Onset  . Breast cancer Mother   . Diabetes Brother   . Hypertension Brother   . Stroke Brother   . Breast cancer Maternal Grandmother   . Breast cancer Maternal Aunt        x 3  . Colon cancer Paternal Aunt        x 2  . Colon cancer Paternal Uncle       Marti Sleigh, MD 04/08/2017, 2:41 PM

## 2017-04-08 NOTE — Patient Instructions (Signed)
You may have some grayish discharge from the medication used at the top of vagina today.  Plan to follow up as scheduled or sooner if needed.  Please call for any needs.

## 2017-04-09 ENCOUNTER — Encounter: Payer: Self-pay | Admitting: Gynecologic Oncology

## 2017-04-09 NOTE — Progress Notes (Signed)
Gynecologic Oncology Multi-Disciplinary Disposition Conference Note  Date of the Conference: April 08, 2017  Patient Name: Rebekah [name]  Referring Provider: Dr. Talbert Nan  Primary GYN Oncologist: Dr. Everitt Amber  Stage/Disposition:  Stage IIIC endometrioid adenocarcinoma. Disposition is to chemotherapy with vaginal brachytherapy per Dr. Denman George. CT CAP pending.   This Multidisciplinary conference took place involving physicians from [place], [place], Radiation Oncology, Pathology, Radiology along with the Gynecologic Oncology Nurse Practitioner and RN.  Comprehensive assessment of the patient's malignancy, staging, need for surgery, chemotherapy, radiation therapy, and need for further testing were reviewed. Supportive measures, both inpatient and following discharge were also discussed. The recommended plan of care is documented. Greater than 35 minutes were spent correlating and coordinating this patient's care.

## 2017-04-10 ENCOUNTER — Encounter: Payer: Self-pay | Admitting: Family Medicine

## 2017-04-11 ENCOUNTER — Telehealth: Payer: Self-pay | Admitting: *Deleted

## 2017-04-11 ENCOUNTER — Other Ambulatory Visit: Payer: Self-pay

## 2017-04-11 ENCOUNTER — Encounter: Payer: Self-pay | Admitting: Gynecologic Oncology

## 2017-04-11 NOTE — Progress Notes (Signed)
[facility]I ordered on surgical case with Dr. Denman George with Suanne Marker at [facility].

## 2017-04-11 NOTE — Telephone Encounter (Signed)
Called and spoke with the patient, gave the CT scan appt date/time. Patient may have to reschedule, gave her the phone number to scheduling

## 2017-04-12 NOTE — Progress Notes (Addendum)
GYN Location of Tumor / Histology:  Endocervical adenocarcinoma - grade 1 endometrial cancer  Rebekah [name] presented with symptoms of: 1 month history of vaginal bleeding (light)  Biopsies revealed:   03/26/17 Diagnosis 1. Lymph node, sentinel, biopsy, right external iliac - LYMPH NODE, NEGATIVE FOR CARCINOMA (0/1). SEE NOTE 2. Lymph node, sentinel, biopsy, left external iliac - FOCUS OF METASTATIC CARCINOMA TO ONE LYMPH NODE, 0.3 MM (1/1). SEE NOTE 3. Uterus +/- tubes/ovaries, neoplastic, cervix - ENDOMETRIOID ADENOCARCINOMA WITH FOCAL SQUAMOUS DIFFERENTIATION, 3.5 CM, FIGO GRADE I, INVOLVING ANTERIOR AND POSTERIOR ENDOMETRIUM - TUMOR INVADES MORE THAN 90% OF MYOMETRIAL THICKNESS - LYMPHOVASCULAR INVASION IS IDENTIFIED - TUMOR INTRALUMINALLY EXTENDS INTO THE RIGHT FALLOPIAN TUBE; LEFT FALLOPIAN TUBE AND BILATERAL OVARIES ARE UNREMARKABLE - SEE ONCOLOGY TABLE  02/20/17 Diagnosis Endometrium, biopsy - ENDOMETRIOID ADENOCARCINOMA, FIGO GRADE I, WITH SQUAMOUS DIFFERENTIATION.  Past/Anticipated interventions by Gyn/Onc surgery, if any: 03/26/17 - Procedure: XI ROBOTIC ASSISTED TOTAL HYSTERECTOMY WITH BILATERAL SALPINGO OOPHORECTOMY AND SENTINAL LYMPH NODES;  Surgeon: Everitt Amber, MD  Past/Anticipated interventions by medical oncology, if any: chemotherapy to start 04/25/17.  Weight changes, if any: yes - has lost about 5 lbs since surgery.  Bowel/Bladder complaints, if any: No.,   Nausea/Vomiting, if any: no  Pain issues, if any:  no  SAFETY ISSUES:  Prior radiation? no  Pacemaker/ICD? no  Possible current pregnancy? no  Is the patient on methotrexate? no  Current Complaints / other details:  Dr. Denman George is recommending chemotherapy x 6 cycles and vaginal brachytherapy.  Patient reports having a small amount of pink vaginal discharge.  She has a family history of colon and breast cancer in her family.  She reports having seizures every other month and said she is due for  one this month.  She is not able to drive because of the seizures.  She is here with her daughter and granddaughter.    BP (!) 133/92 (Patient Position: Sitting)   Pulse (!) 104   Temp 97.8 F (36.6 C) (Oral)   Ht 4\' 11"  (1.499 m)   Wt 167 lb 6.4 oz (75.9 kg)   SpO2 96%   BMI 33.81 kg/m    Wt Readings from Last 3 Encounters:  04/17/17 167 lb 6.4 oz (75.9 kg)  04/16/17 166 lb 4.8 oz (75.4 kg)  04/08/17 167 lb 8 oz (76 kg)

## 2017-04-16 ENCOUNTER — Inpatient Hospital Stay: Payer: BLUE CROSS/BLUE SHIELD | Attending: Gynecologic Oncology | Admitting: Hematology and Oncology

## 2017-04-16 ENCOUNTER — Other Ambulatory Visit: Payer: Self-pay | Admitting: Hematology and Oncology

## 2017-04-16 ENCOUNTER — Encounter (HOSPITAL_COMMUNITY): Payer: Self-pay

## 2017-04-16 ENCOUNTER — Ambulatory Visit (HOSPITAL_COMMUNITY)
Admission: RE | Admit: 2017-04-16 | Discharge: 2017-04-16 | Disposition: A | Discharge status: Home / Self Care | Payer: BLUE CROSS/BLUE SHIELD | Source: Ambulatory Visit | Attending: Gynecologic Oncology | Admitting: Gynecologic Oncology

## 2017-04-16 ENCOUNTER — Encounter: Payer: Self-pay | Admitting: Hematology and Oncology

## 2017-04-16 VITALS — BP 131/96 | HR 97 | Temp 98.7°F | Resp 18 | Ht 59.0 in | Wt 166.3 lb

## 2017-04-16 DIAGNOSIS — Z90710 Acquired absence of both cervix and uterus: Secondary | ICD-10-CM | POA: Diagnosis not present

## 2017-04-16 DIAGNOSIS — Z5111 Encounter for antineoplastic chemotherapy: Secondary | ICD-10-CM | POA: Diagnosis not present

## 2017-04-16 DIAGNOSIS — C541 Malignant neoplasm of endometrium: Secondary | ICD-10-CM

## 2017-04-16 DIAGNOSIS — Z800 Family history of malignant neoplasm of digestive organs: Secondary | ICD-10-CM | POA: Insufficient documentation

## 2017-04-16 DIAGNOSIS — Z90722 Acquired absence of ovaries, bilateral: Secondary | ICD-10-CM | POA: Insufficient documentation

## 2017-04-16 DIAGNOSIS — F418 Other specified anxiety disorders: Secondary | ICD-10-CM | POA: Diagnosis not present

## 2017-04-16 DIAGNOSIS — Z803 Family history of malignant neoplasm of breast: Secondary | ICD-10-CM

## 2017-04-16 DIAGNOSIS — Z79899 Other long term (current) drug therapy: Secondary | ICD-10-CM | POA: Diagnosis not present

## 2017-04-16 DIAGNOSIS — Z809 Family history of malignant neoplasm, unspecified: Secondary | ICD-10-CM | POA: Insufficient documentation

## 2017-04-16 MED ORDER — IOPAMIDOL (ISOVUE-300) INJECTION 61%
INTRAVENOUS | Status: AC
  Filled 2017-04-16: qty 100

## 2017-04-16 MED ORDER — IOPAMIDOL (ISOVUE-300) INJECTION 61%
100.0000 mL | Freq: Once | INTRAVENOUS | Status: AC | PRN
  Administered 2017-04-16: 100 mL via INTRAVENOUS

## 2017-04-16 MED ORDER — CITALOPRAM HYDROBROMIDE 20 MG PO TABS: 20.0000 mg | ORAL_TABLET | Freq: Every day | ORAL | 0 refills | Status: DC

## 2017-04-16 MED FILL — CITALOPRAM HBR 20 MG TABLET: 20 | 90 days supply | Qty: 90 | Fill #0

## 2017-04-16 NOTE — Assessment & Plan Note (Addendum)
We discussed the role of adjuvant treatment I have reviewed the CT imaging with the patient and her daughter I plan to schedule chemotherapy to start next week, combination treatment with carboplatin and Taxol Due to her young age, she does not need G-CSF support We discussed port placement before treatment I will schedule blood work, chemo education class and chemotherapy consent before we start treatment

## 2017-04-16 NOTE — Assessment & Plan Note (Signed)
Both her mother and grandmother died from breast cancer There is also strong family history of colon cancer in her paternal side of the family I will inquire whether genetic counseling referral is appropriate She is interested to be refer if needed

## 2017-04-16 NOTE — Progress Notes (Signed)
START ON PATHWAY REGIMEN - Uterine     A cycle is every 21 days:     Paclitaxel      Carboplatin   **Always confirm dose/schedule in your pharmacy ordering system**  Patient Characteristics: Endometrioid Histology, Newly Diagnosed, Resected, Stage IIIC1/IIIC2 - Grade 1, 2, or 3 Histology: Endometrioid Histology Therapeutic Status: Newly Diagnosed AJCC T Category: T3 AJCC N Category: N1 AJCC M Category: M0 AJCC 8 Stage Grouping: IIIC1 Surgical Status: Resected Intent of Therapy: Curative Intent, Discussed with Patient 

## 2017-04-16 NOTE — Assessment & Plan Note (Signed)
The patient has ran out of her prescription of citalopram I refilled the prescription for the patient but recommend she continues close follow-up with her primary care doctor for management

## 2017-04-16 NOTE — Progress Notes (Signed)
[facility] NOTE  Patient Care Team: Wardell Honour, MD as PCP - General (Family Medicine)  ASSESSMENT & PLAN:  Endometrial cancer [facility]) We discussed the role of adjuvant treatment I have reviewed the CT imaging with the patient and her daughter I plan to schedule chemotherapy to start next week, combination treatment with carboplatin and Taxol Due to her young age, she does not need G-CSF support We discussed port placement before treatment I will schedule blood work, chemo education class and chemotherapy consent before we start treatment  Anxiety associated with depression The patient has ran out of her prescription of citalopram I refilled the prescription for the patient but recommend she continues close follow-up with her primary care doctor for management  Family history of cancer Both her mother and grandmother died from breast cancer There is also strong family history of colon cancer in her paternal side of the family I will inquire whether genetic counseling referral is appropriate She is interested to be refer if needed   Orders Placed This Encounter  Procedures  . IR FLUORO GUIDE PORT INSERTION RIGHT    Standing Status:   Future    Standing Expiration Date:   06/17/2018    Order Specific Question:   Reason for Exam (SYMPTOM  OR DIAGNOSIS REQUIRED)    Answer:   need port for chemo    Order Specific Question:   Preferred Imaging Location?    Answer:   [facility]  . CBC with Differential ([place] Only)    Standing Status:   Standing    Number of Occurrences:   20    Standing Expiration Date:   04/17/2018  . CMP ([place] only)    Standing Status:   Standing    Number of Occurrences:   20    Standing Expiration Date:   04/17/2018     CHIEF COMPLAINTS/PURPOSE OF CONSULTATION:  Endometrial cancer status post surgery, for adjuvant treatment  HISTORY OF PRESENTING ILLNESS:  Rebekah [name] 62 y.o. female is here because  of recent diagnosis of uterine cancer.  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 I have reviewed her chart and materials related to her cancer extensively and collaborated history with the patient. Summary of oncologic history is as follows: Oncology History   Endometrioid with focal squamous  MSI stable     Endometrial cancer ([place])   02/20/2017 Pathology Results    Endometrium, biopsy - ENDOMETRIOID ADENOCARCINOMA, FIGO GRADE I, WITH SQUAMOUS DIFFERENTIATION. - SEE COMMENT      02/20/2017 Initial Diagnosis    Initially presented with PMB      03/26/2017 Pathology Results    1. Lymph node, sentinel, biopsy, right external iliac - LYMPH NODE, NEGATIVE FOR CARCINOMA (0/1). SEE NOTE 2. Lymph node, sentinel, biopsy, left external iliac - FOCUS OF METASTATIC CARCINOMA TO ONE LYMPH NODE, 0.3 MM (1/1). SEE NOTE 3. Uterus +/- tubes/ovaries, neoplastic, cervix - ENDOMETRIOID ADENOCARCINOMA WITH FOCAL SQUAMOUS DIFFERENTIATION, 3.5 CM, FIGO GRADE I, INVOLVING ANTERIOR AND POSTERIOR ENDOMETRIUM - TUMOR INVADES MORE THAN 90%  OF MYOMETRIAL THICKNESS - LYMPHOVASCULAR INVASION IS IDENTIFIED - TUMOR INTRALUMINALLY EXTENDS INTO THE RIGHT FALLOPIAN TUBE; LEFT FALLOPIAN TUBE AND BILATERAL OVARIES ARE UNREMARKABLE - SEE ONCOLOGY TABLE Microscopic Comment 3. ONCOLOGY TABLE-UTERUS, CARCINOMA OR CARCINOSARCOMA  Specimen: Uterus, tubes, ovaries and cervix Procedure: Total hysterectomy and bilateral  salpingo-oophorectomy Lymph node sampling performed: Yes Specimen integrity: Intact Maximum tumor size: 3.5 cm Histologic type: Endometrioid adenocarcinoma with focal squamous differentiation Grade: I Myometrial invasion: 1.4 cm where myometrium is 1.5 cm in thickness Uterine Serosa Involvement: Not identified Cervical stromal involvement: Not identified Extent of involvement of other organs: Right fallopian tube Lymph - vascular invasion: Present Lymph nodes: Examined: 2 Sentinel 0 Non-sentinel 2 Total Lymph nodes with metastasis: 1 Isolated tumor cells (< 0.2 mm): 0 TNM code: pT1b, pN72m FIGO Stage (based on pathologic findings, needs clinical correlation): IIIC      03/26/2017 Surgery    Pre-operative Diagnosis: endometrial cancer grade 1  Operation: Robotic-assisted laparoscopic total hysterectomy with bilateral salpingoophorectomy, SLN biopsy   Surgeon: RDonaciano Eva Operative Findings:  : normal 7cm uterus, tubes and ovaries, normal appearing nodes, no apparent extrauterine disease        04/11/2017 Cancer Staging    Staging form: Corpus Uteri - Carcinoma and Carcinosarcoma, AJCC 8th Edition - Pathologic: FIGO Stage IIIC1 (pT3a, pN1, cM0) - Signed by GHeath Lark MD on 04/11/2017      She has recovered well from recent surgery Denies nausea, changes in bowel habits or bloating No further bleeding She has mild intermittent pain in the right lower quadrant since surgery but stable She does not need to pain medicine She has lost 5-7 pounds due to recent surgery  MEDICAL HISTORY:  Past Medical History:  Diagnosis Date  . Anxiety   . Arthritis   . Asthma    NO INHALERS USED  . [place]    ENDOMETRIAL  . Depression   . Fibromyalgia   . GERD (gastroesophageal reflux disease)   . Insomnia   . [place]    [facility]  . [place]     SURGICAL HISTORY: Past Surgical History:   Procedure Laterality Date  . ABDOMINAL HYSTERECTOMY    . BREAST BIOPSY Right    biopsy in the 1990's  . BUNIONECTOMY Bilateral    2000's - R ONCE and L once  . CARPAL TUNNEL RELEASE    . COLONSCOPY    . ENDOMETRIAL BIOPSY  02/2017  . ROBOTIC ASSISTED TOTAL HYSTERECTOMY WITH BILATERAL SALPINGO OOPHERECTOMY Bilateral 03/26/2017   Procedure: XI ROBOTIC ASSISTED TOTAL HYSTERECTOMY WITH BILATERAL SALPINGO OOPHORECTOMY AND SENTINAL LYMPH NODES;  Surgeon: REveritt Amber MD;  Location: [facility];  Service: Gynecology;  Laterality: Bilateral;  . TUBAL LIGATION  1984  . TUBAL LIGATION      SOCIAL HISTORY: Social History   Socioeconomic History  . Marital status: Married    Spouse name: Not on file  . Number of children: 4  . Years of education: 8  . Highest education level: Not on file  Occupational History  . 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 Financial resource strain: Not on file  . Food insecurity:    Worry: Not on file    Inability: Not on file  . Transportation needs:    Medical: Not on file    Non-medical: Not on file  Tobacco Use  . Smoking status: Never Smoker  . Smokeless tobacco: Never Used  Substance  and Sexual Activity  . Alcohol use: No    Alcohol/week: 0.0 oz  . Drug use: No  . Sexual activity: Not Currently    Partners: Male    Birth control/protection: Post-menopausal  Lifestyle  . Physical activity:    Days per week: Not on file    Minutes per session: Not on file  . Stress: Not on file  Relationships  . Social connections:    Talks on phone: Not on file    Gets together: Not on file    Attends religious service: Not on file    Active member of club or organization: Not on file    Attends meetings of clubs or organizations: Not on file    Relationship status: Not on file  . Intimate partner violence:    Fear of current or ex partner: Not on file    Emotionally abused: Not on file    Physically abused: Not on file    Forced sexual activity: Not on  file  Other Topics Concern  . Not on file  Social History Narrative   Lives with daughter, Antoniette "Rebekah [name]"   Caffeine use: coffee/tea/soda daily    FAMILY HISTORY: Family History  Problem Relation Age of Onset  . Breast cancer Mother 29  . Diabetes Brother   . Hypertension Brother   . Stroke Brother   . Breast cancer Maternal Grandmother   . Breast cancer Maternal Aunt        x 3  . Colon cancer Paternal Aunt        x 2  . Colon cancer Paternal Uncle     ALLERGIES:  is allergic to [employer] tartrate] and soma [carisoprodol].  MEDICATIONS:  Current Outpatient Medications  Medication Sig Dispense Refill  . citalopram (CELEXA) 20 MG tablet Take 1 tablet (20 mg total) by mouth daily. 90 tablet 0  . diphenhydramine-acetaminophen (TYLENOL PM) 25-500 MG TABS tablet Take 1 tablet by mouth at bedtime.    . DULoxetine (CYMBALTA) 60 MG capsule Take 2 capsules (120 mg total) by mouth daily. (Patient taking differently: Take 60 mg by mouth 2 (two) times daily. ) 180 capsule 0  . levETIRAcetam (KEPPRA) 1000 MG tablet Take 1,500 mg by mouth 2 (two) times daily.    . Multiple Vitamin (MULTIVITAMIN) capsule Take 1 capsule by mouth daily.    . ranitidine (ZANTAC) 150 MG capsule Take 150 mg by mouth daily.    Marland Kitchen senna-docusate (SENOKOT-S) 8.6-50 MG tablet Take 2 tablets by mouth at bedtime. 30 tablet 1   No current facility-administered medications for this visit.    Facility-Administered Medications Ordered in Other Visits  Medication Dose Route Frequency Provider Last Rate Last Dose  . iopamidol (ISOVUE-300) 61 % injection             REVIEW OF SYSTEMS:   Constitutional: Denies fevers, chills or abnormal night sweats Eyes: Denies blurriness of vision, double vision or watery eyes Ears, nose, mouth, throat, and face: Denies mucositis or sore throat Respiratory: Denies cough, dyspnea or wheezes Cardiovascular: Denies palpitation, chest discomfort or lower extremity  swelling Gastrointestinal:  Denies nausea, heartburn or change in bowel habits Skin: Denies abnormal skin rashes Lymphatics: Denies new lymphadenopathy or easy bruising Neurological:Denies numbness, tingling or new weaknesses Behavioral/Psych: Mood is stable, no new changes  All other systems were reviewed with the patient and are negative.  PHYSICAL EXAMINATION: ECOG PERFORMANCE STATUS: 1 - Symptomatic but completely ambulatory  Vitals:   04/16/17 1403  BP: Marland Kitchen)  131/96  Pulse: 97  Resp: 18  Temp: 98.7 F (37.1 C)  SpO2: 98%   Filed Weights   04/16/17 1403  Weight: 166 lb 4.8 oz (75.4 kg)    GENERAL:alert, no distress and comfortable SKIN: skin color, texture, turgor are normal, no rashes or significant lesions EYES: normal, conjunctiva are pink and non-injected, sclera clear OROPHARYNX:no exudate, no erythema and lips, buccal mucosa, and tongue normal  NECK: supple, thyroid normal size, non-tender, without nodularity LYMPH:  no palpable lymphadenopathy in the cervical, axillary or inguinal LUNGS: clear to auscultation and percussion with normal breathing effort HEART: regular rate & rhythm and no murmurs and no lower extremity edema ABDOMEN:abdomen soft, mild tenderness in the right lower quadrant without rebound or guarding and normal bowel sounds.  Well-healed surgical scar Musculoskeletal:no cyanosis of digits and no clubbing  PSYCH: alert & oriented x 3 with fluent speech NEURO: no focal motor/sensory deficits  LABORATORY DATA:  I have reviewed the data as listed Lab Results  Component Value Date   WBC 7.4 03/27/2017   HGB 13.0 03/27/2017   HCT 39.4 03/27/2017   MCV 97.8 03/27/2017   PLT 248 03/27/2017   Recent Labs    04/18/16 1349 11/07/16 1551 03/20/17 0857 03/27/17 0509  NA 138 140 138 138  K 4.5 4.1 3.9 3.9  CL 98 102 104 103  CO2 '23 23 25 27  '$ GLUCOSE 89 88 100* 129*  BUN '16 11 17 8  '$ CREATININE 0.81 0.68 0.84 0.85  CALCIUM 9.2 9.0 8.8* 8.8*   GFRNONAA 80 97 >60 >60  GFRAA 93 112 >60 >60  PROT 7.2 6.7 7.2  --   ALBUMIN 4.3 4.4 4.1  --   AST '19 24 26  '$ --   ALT '17 19 21  '$ --   ALKPHOS 92 79 69  --   BILITOT 0.3 0.3 0.6  --     RADIOGRAPHIC STUDIES: I have reviewed multiple imaging study with the patient and the daughter I have personally reviewed the radiological images as listed and agreed with the findings in the report. Ct Chest W Contrast  Result Date: 04/16/2017 CLINICAL DATA:  Staging endometrial cancer. EXAM: CT CHEST, ABDOMEN, AND PELVIS WITH CONTRAST TECHNIQUE: Multidetector CT imaging of the chest, abdomen and pelvis was performed following the standard protocol during bolus administration of intravenous contrast. CONTRAST:  130m ISOVUE-300 IOPAMIDOL (ISOVUE-300) INJECTION 61% COMPARISON:  None. FINDINGS: CT CHEST FINDINGS Cardiovascular: The heart is normal in size. No pericardial effusion. The aorta is normal in caliber. Mild tortuosity but no dissection. The branch vessels are patent. No definite coronary artery calcifications. Mediastinum/Nodes: No mediastinal or hilar mass or adenopathy. Esophagus is grossly normal. Lungs/Pleura: No significant pulmonary findings. Patchy dependent subpleural atelectasis but no worrisome pulmonary lesions to suggest metastatic disease. No pleural effusion. Areas of mild bronchiectasis. Musculoskeletal: No breast masses, supraclavicular or axillary lymphadenopathy. Few small scattered lymph nodes are noted. The thyroid gland appears normal. No significant bony findings. CT ABDOMEN PELVIS FINDINGS Hepatobiliary: No focal hepatic lesions or intrahepatic biliary dilatation. The gallbladder appears normal. No common bile duct dilatation. Pancreas: No mass, inflammation or ductal dilatation. Spleen: Normal size.  No focal lesions.  Accessory spleen noted. Adrenals/Urinary Tract: The adrenal glands are normal. Large simple left renal cyst. No worrisome renal lesions or hydronephrosis. The ureters and  bladder are unremarkable. Stomach/Bowel: The stomach, duodenum, small bowel and colon are unremarkable. No acute inflammatory process, mass lesions or obstructive findings. The terminal ileum is normal. The appendix  is not identified. Vascular/Lymphatic: The aorta and branch vessels are normal. The major venous structures are patent. No mesenteric or retroperitoneal mass or lymphadenopathy. There is an 8 mm lymph node just anterior to the right kidney in the anterior pararenal space on image 65. Smaller adjacent lymph nodes are also noted. There also small lymph nodes near the ascending colon on image number 70. No pelvic lymphadenopathy or inguinal lymphadenopathy. Reproductive: Status post hysterectomy and bilateral oophorectomy and pelvic lymphadenectomy. Other: No pelvic mass or free pelvic fluid collections. There is a small amount of air noted in the vaginal cuff on the left. No abscess. Musculoskeletal: No significant bony findings. No worrisome bone lesions. IMPRESSION: 1. Status post hysterectomy and bilateral oophorectomy and pelvic lymphadenectomy. 2. A few small scattered lymph nodes are noted in the anterior pararenal space on the right along with a few small lymph nodes near the ascending colon. Recommend attention on future studies. No mesenteric or retroperitoneal mass. 3. No findings for metastatic disease involving the chest. Electronically Signed   By: Marijo Sanes M.D.   On: 04/16/2017 09:39   Ct Abdomen Pelvis W Contrast  Result Date: 04/16/2017 CLINICAL DATA:  Staging endometrial cancer. EXAM: CT CHEST, ABDOMEN, AND PELVIS WITH CONTRAST TECHNIQUE: Multidetector CT imaging of the chest, abdomen and pelvis was performed following the standard protocol during bolus administration of intravenous contrast. CONTRAST:  124m ISOVUE-300 IOPAMIDOL (ISOVUE-300) INJECTION 61% COMPARISON:  None. FINDINGS: CT CHEST FINDINGS Cardiovascular: The heart is normal in size. No pericardial effusion. The aorta  is normal in caliber. Mild tortuosity but no dissection. The branch vessels are patent. No definite coronary artery calcifications. Mediastinum/Nodes: No mediastinal or hilar mass or adenopathy. Esophagus is grossly normal. Lungs/Pleura: No significant pulmonary findings. Patchy dependent subpleural atelectasis but no worrisome pulmonary lesions to suggest metastatic disease. No pleural effusion. Areas of mild bronchiectasis. Musculoskeletal: No breast masses, supraclavicular or axillary lymphadenopathy. Few small scattered lymph nodes are noted. The thyroid gland appears normal. No significant bony findings. CT ABDOMEN PELVIS FINDINGS Hepatobiliary: No focal hepatic lesions or intrahepatic biliary dilatation. The gallbladder appears normal. No common bile duct dilatation. Pancreas: No mass, inflammation or ductal dilatation. Spleen: Normal size.  No focal lesions.  Accessory spleen noted. Adrenals/Urinary Tract: The adrenal glands are normal. Large simple left renal cyst. No worrisome renal lesions or hydronephrosis. The ureters and bladder are unremarkable. Stomach/Bowel: The stomach, duodenum, small bowel and colon are unremarkable. No acute inflammatory process, mass lesions or obstructive findings. The terminal ileum is normal. The appendix is not identified. Vascular/Lymphatic: The aorta and branch vessels are normal. The major venous structures are patent. No mesenteric or retroperitoneal mass or lymphadenopathy. There is an 8 mm lymph node just anterior to the right kidney in the anterior pararenal space on image 65. Smaller adjacent lymph nodes are also noted. There also small lymph nodes near the ascending colon on image number 70. No pelvic lymphadenopathy or inguinal lymphadenopathy. Reproductive: Status post hysterectomy and bilateral oophorectomy and pelvic lymphadenectomy. Other: No pelvic mass or free pelvic fluid collections. There is a small amount of air noted in the vaginal cuff on the left. No  abscess. Musculoskeletal: No significant bony findings. No worrisome bone lesions. IMPRESSION: 1. Status post hysterectomy and bilateral oophorectomy and pelvic lymphadenectomy. 2. A few small scattered lymph nodes are noted in the anterior pararenal space on the right along with a few small lymph nodes near the ascending colon. Recommend attention on future studies. No mesenteric or retroperitoneal mass. 3.  No findings for metastatic disease involving the chest. Electronically Signed   By: Marijo Sanes M.D.   On: 04/16/2017 09:39    I spent 40 minutes counseling the patient face to face. The total time spent in the appointment was 60 minutes and more than 50% was on counseling.  All questions were answered. The patient knows to call the clinic with any problems, questions or concerns.  Heath Lark, MD 04/16/2017 3:26 PM

## 2017-04-17 ENCOUNTER — Ambulatory Visit
Admission: RE | Admit: 2017-04-17 | Discharge: 2017-04-17 | Disposition: A | Discharge status: Home / Self Care | Payer: BLUE CROSS/BLUE SHIELD | Source: Ambulatory Visit | Attending: Radiation Oncology | Admitting: Radiation Oncology

## 2017-04-17 ENCOUNTER — Ambulatory Visit: Payer: BLUE CROSS/BLUE SHIELD | Admitting: Family Medicine

## 2017-04-17 ENCOUNTER — Encounter: Payer: Self-pay | Admitting: Radiation Oncology

## 2017-04-17 ENCOUNTER — Telehealth: Payer: Self-pay | Admitting: Hematology and Oncology

## 2017-04-17 ENCOUNTER — Other Ambulatory Visit: Payer: Self-pay

## 2017-04-17 DIAGNOSIS — Z79899 Other long term (current) drug therapy: Secondary | ICD-10-CM | POA: Diagnosis not present

## 2017-04-17 DIAGNOSIS — F329 Major depressive disorder, single episode, unspecified: Secondary | ICD-10-CM | POA: Insufficient documentation

## 2017-04-17 DIAGNOSIS — M129 Arthropathy, unspecified: Secondary | ICD-10-CM | POA: Insufficient documentation

## 2017-04-17 DIAGNOSIS — Z90722 Acquired absence of ovaries, bilateral: Secondary | ICD-10-CM | POA: Diagnosis not present

## 2017-04-17 DIAGNOSIS — G40909 Epilepsy, unspecified, not intractable, without status epilepticus: Secondary | ICD-10-CM | POA: Insufficient documentation

## 2017-04-17 DIAGNOSIS — G4700 Insomnia, unspecified: Secondary | ICD-10-CM | POA: Diagnosis not present

## 2017-04-17 DIAGNOSIS — J45909 Unspecified asthma, uncomplicated: Secondary | ICD-10-CM | POA: Diagnosis not present

## 2017-04-17 DIAGNOSIS — C775 Secondary and unspecified malignant neoplasm of intrapelvic lymph nodes: Secondary | ICD-10-CM | POA: Diagnosis not present

## 2017-04-17 DIAGNOSIS — C541 Malignant neoplasm of endometrium: Secondary | ICD-10-CM

## 2017-04-17 DIAGNOSIS — Z800 Family history of malignant neoplasm of digestive organs: Secondary | ICD-10-CM | POA: Insufficient documentation

## 2017-04-17 DIAGNOSIS — Z90710 Acquired absence of both cervix and uterus: Secondary | ICD-10-CM | POA: Diagnosis not present

## 2017-04-17 DIAGNOSIS — F419 Anxiety disorder, unspecified: Secondary | ICD-10-CM | POA: Insufficient documentation

## 2017-04-17 DIAGNOSIS — N281 Cyst of kidney, acquired: Secondary | ICD-10-CM | POA: Insufficient documentation

## 2017-04-17 DIAGNOSIS — K219 Gastro-esophageal reflux disease without esophagitis: Secondary | ICD-10-CM | POA: Diagnosis not present

## 2017-04-17 DIAGNOSIS — M797 Fibromyalgia: Secondary | ICD-10-CM | POA: Insufficient documentation

## 2017-04-17 DIAGNOSIS — Z803 Family history of malignant neoplasm of breast: Secondary | ICD-10-CM | POA: Diagnosis not present

## 2017-04-17 NOTE — Telephone Encounter (Signed)
Left message for patient regarding upcoming April and may appointments.

## 2017-04-19 ENCOUNTER — Inpatient Hospital Stay: Payer: BLUE CROSS/BLUE SHIELD

## 2017-04-19 ENCOUNTER — Encounter: Payer: Self-pay | Admitting: Hematology and Oncology

## 2017-04-19 ENCOUNTER — Encounter: Payer: Self-pay | Admitting: *Deleted

## 2017-04-19 ENCOUNTER — Inpatient Hospital Stay (HOSPITAL_BASED_OUTPATIENT_CLINIC_OR_DEPARTMENT_OTHER): Payer: BLUE CROSS/BLUE SHIELD | Admitting: Hematology and Oncology

## 2017-04-19 ENCOUNTER — Encounter: Payer: Self-pay | Admitting: Gynecologic Oncology

## 2017-04-19 ENCOUNTER — Inpatient Hospital Stay (HOSPITAL_BASED_OUTPATIENT_CLINIC_OR_DEPARTMENT_OTHER): Payer: BLUE CROSS/BLUE SHIELD | Admitting: Gynecologic Oncology

## 2017-04-19 VITALS — BP 128/89 | HR 94 | Resp 20 | Ht 59.0 in | Wt 166.0 lb

## 2017-04-19 VITALS — BP 109/88 | HR 80 | Temp 98.6°F | Resp 18 | Ht 59.0 in | Wt 166.2 lb

## 2017-04-19 DIAGNOSIS — C541 Malignant neoplasm of endometrium: Secondary | ICD-10-CM

## 2017-04-19 DIAGNOSIS — Z90710 Acquired absence of both cervix and uterus: Secondary | ICD-10-CM | POA: Diagnosis not present

## 2017-04-19 DIAGNOSIS — Z800 Family history of malignant neoplasm of digestive organs: Secondary | ICD-10-CM

## 2017-04-19 DIAGNOSIS — Z803 Family history of malignant neoplasm of breast: Secondary | ICD-10-CM | POA: Diagnosis not present

## 2017-04-19 DIAGNOSIS — Z79899 Other long term (current) drug therapy: Secondary | ICD-10-CM | POA: Diagnosis not present

## 2017-04-19 DIAGNOSIS — F418 Other specified anxiety disorders: Secondary | ICD-10-CM | POA: Diagnosis not present

## 2017-04-19 DIAGNOSIS — Z7189 Other specified counseling: Secondary | ICD-10-CM | POA: Diagnosis not present

## 2017-04-19 DIAGNOSIS — Z809 Family history of malignant neoplasm, unspecified: Secondary | ICD-10-CM

## 2017-04-19 DIAGNOSIS — Z90722 Acquired absence of ovaries, bilateral: Secondary | ICD-10-CM | POA: Diagnosis not present

## 2017-04-19 DIAGNOSIS — Z5111 Encounter for antineoplastic chemotherapy: Secondary | ICD-10-CM | POA: Diagnosis not present

## 2017-04-19 LAB — CMP (CANCER CENTER ONLY)
ALK PHOS: 85 U/L (ref 40–150)
ALT: 17 U/L (ref 0–55)
AST: 20 U/L (ref 5–34)
Albumin: 3.8 g/dL (ref 3.5–5.0)
Anion gap: 9 (ref 3–11)
BILIRUBIN TOTAL: 0.2 mg/dL (ref 0.2–1.2)
BUN: 11 mg/dL (ref 7–26)
CO2: 25 mmol/L (ref 22–29)
Calcium: 9.6 mg/dL (ref 8.4–10.4)
Chloride: 104 mmol/L (ref 98–109)
Creatinine: 0.86 mg/dL (ref 0.60–1.10)
GFR, Est AFR Am: >60 mL/min (ref >60)
Glucose, Bld: 91 mg/dL (ref 70–140)
Potassium: 4.1 mmol/L (ref 3.5–5.1)
Sodium: 138 mmol/L (ref 136–145)
TOTAL PROTEIN: 7.1 g/dL (ref 6.4–8.3)

## 2017-04-19 LAB — CBC WITH DIFFERENTIAL (CANCER CENTER ONLY)
Basophils Absolute: 0 10*3/uL (ref 0.0–0.1)
Basophils Relative: 1 %
EOS PCT: 3 %
Eosinophils Absolute: 0.2 10*3/uL (ref 0.0–0.5)
HCT: 41.7 % (ref 34.8–46.6)
Hemoglobin: 13.9 g/dL (ref 11.6–15.9)
Lymphocytes Relative: 35 %
Lymphs Abs: 2.4 10*3/uL (ref 0.9–3.3)
MCH: 31.8 pg (ref 25.1–34.0)
MCHC: 33.4 g/dL (ref 31.5–36.0)
MCV: 95.3 fL (ref 79.5–101.0)
Monocytes Absolute: 0.6 10*3/uL (ref 0.1–0.9)
Monocytes Relative: 8 %
Neutro Abs: 3.7 10*3/uL (ref 1.5–6.5)
Neutrophils Relative %: 53 %
PLATELETS: 309 10*3/uL (ref 145–400)
RBC: 4.37 MIL/uL (ref 3.70–5.45)
RDW: 12.5 % (ref 11.2–14.5)
WBC: 6.9 10*3/uL (ref 3.9–10.3)

## 2017-04-19 MED ORDER — LORAZEPAM 0.5 MG PO TABS: 0.5000 mg | ORAL_TABLET | Freq: Three times a day (TID) | ORAL | 0 refills | Status: DC | PRN

## 2017-04-19 MED ORDER — LIDOCAINE-PRILOCAINE 2.5-2.5 % EX CREA: TOPICAL_CREAM | CUTANEOUS | 3 refills | Status: DC

## 2017-04-19 MED ORDER — ONDANSETRON HCL 8 MG PO TABS: 8.0000 mg | ORAL_TABLET | Freq: Three times a day (TID) | ORAL | 1 refills | Status: DC | PRN

## 2017-04-19 MED ORDER — DEXAMETHASONE 4 MG PO TABS: ORAL_TABLET | ORAL | 0 refills | Status: DC

## 2017-04-19 MED ORDER — PROCHLORPERAZINE MALEATE 10 MG PO TABS: 10.0000 mg | ORAL_TABLET | Freq: Four times a day (QID) | ORAL | 1 refills | Status: DC | PRN

## 2017-04-19 MED FILL — PROCHLORPERAZINE 10 MG TAB: 10 | 7 days supply | Qty: 30 | Fill #0

## 2017-04-19 MED FILL — ONDANSETRON HCL 8 MG TABLET: 8 | 10 days supply | Qty: 30 | Fill #0

## 2017-04-19 MED FILL — LIDOCAINE-PRILOCAINE CREAM: 2.5-2.5 | 20 days supply | Qty: 30 | Fill #0

## 2017-04-19 MED FILL — DEXAMETHASONE 4 MG TABLET: 4 | 84 days supply | Qty: 40 | Fill #0

## 2017-04-19 NOTE — Patient Instructions (Signed)
Please contact Dr Serita Grit office with questions regarding healing at [phone].  If the vaginal discharge increases, notify Dr Denman George.  Dr Denman George will see you back for follow-up after completion of therapy.

## 2017-04-19 NOTE — Assessment & Plan Note (Signed)
We reviewed the NCCN guidelines We discussed the role of chemotherapy. The intent is of curative intent.  We discussed some of the risks, benefits, side-effects of carboplatin & Taxol. Treatment is intravenous, every 3 weeks x 6 cycles  Some of the short term side-effects included, though not limited to, including weight loss, life threatening infections, risk of allergic reactions, need for transfusions of blood products, nausea, vomiting, change in bowel habits, loss of hair, admission to hospital for various reasons, and risks of death.   Long term side-effects are also discussed including risks of infertility, permanent damage to nerve function, hearing loss, chronic fatigue, kidney damage with possibility needing hemodialysis, and rare secondary malignancy including bone marrow disorders.  The patient is aware that the response rates discussed earlier is not guaranteed.  After a long discussion, patient made an informed decision to proceed with the prescribed plan of care.   Patient education material was dispensed. We discussed premedication with dexamethasone before chemotherapy. She is scheduled for port placement next week I will refer her to see genetic counselor for genetic testing I will see her back prior to cycle 2 of chemotherapy

## 2017-04-19 NOTE — Progress Notes (Signed)
Follow-up Note: Gyn-Onc  Consult was requested by Dr. Talbert Nan for the evaluation of Rebekah [name] 62 y.o. female  CC:  Chief Complaint  Patient presents with  . Endometrial cancer, FIGO stage IIIC [facility])    Assessment/Plan:  Rebekah [name]  is a 62 y.o.  year old with stage IIIC1 grade 1 endometrial cancer.  My recommendation is for carboplatin and paclitaxel x 6 cycles.  I recommend vaginal brachy, but due to the apearance of the cuff, would wait until >8weeks postop.  I offered vaginal premarin to facilitate cuff healing, but Rebekah [name] declined due to all of the meds she is on for her impending chemotherapy.  She has some anxiety and depression and is speaking with social work.   HPI: Rebekah [name] is a 62 year old woman who is seen in consultation at the request of Dr Talbert Nan for endometrial cancer (grade 1).  The patient reports a 1 month history of vaginal bleeding (light). She was seen by Dr Talbert Nan on 02/20/17 and endometrial pipelle was performed. It revealed a grade 1 endometrial cancer.  The patient has family history of breast cancer on the maternal side. She also has a family history of colon cancer on the paternal family. She has a seizure disorder for which she takes Keppra. She has seizures "every other month". She has had a tubal ligation but no other surgeries on her abdomen. She has had 4 vaginal deliveries.    Interval Hx:   On March 26, 2017 she underwent a robotic assisted total hysterectomy BSO and sentinel lymph node biopsy.  Surgery was uncomplicated.  Final pathology revealed a FIGO grade 1 endometrioid endometrial adenocarcinoma measuring 3.5 cm.  It demonstrated 90% myometrial invasion with 1.4 of 1.5 cm invasion into the myometrial wall.  Lymphovascular space invasion was present.  Cervical stromal involvement was not identified.  There is metastatic endometrial cancer identified extending into the right fallopian tube.  There is also a  focus of metastatic carcinoma to one pelvic lymph node (the left external iliac lymph node).  This was a 0.3 mm metastasis.  This represented a stage III C1 endometrial cancer.  And, in accordance with [place] and guidelines, adjuvant systemic chemotherapy with carbo platinum paclitaxel was recommended.  I also recommend vaginal brachii therapy to reduce the risk of vaginal recurrence given the substantial uterine involvement deep myometrial invasion and LV SI.  Postoperatively she has met with both medical oncology and radiation oncology.  She is somewhat overwhelmed and anxious by this.  At baseline she has anxiety and depression.  She is tearful.  She is localizing symptoms of discharge from the vagina but denies bleeding.  She is otherwise doing well postoperatively with no postoperative complaints.  Current Meds:  Outpatient Encounter Medications as of 04/19/2017  Medication Sig  . citalopram (CELEXA) 20 MG tablet Take 1 tablet (20 mg total) by mouth daily.  . diphenhydramine-acetaminophen (TYLENOL PM) 25-500 MG TABS tablet Take 1 tablet by mouth at bedtime.  . DULoxetine (CYMBALTA) 60 MG capsule Take 2 capsules (120 mg total) by mouth daily. (Patient taking differently: Take 60 mg by mouth 2 (two) times daily. )  . levETIRAcetam (KEPPRA) 1000 MG tablet Take 1,500 mg by mouth 2 (two) times daily.  . Multiple Vitamin (MULTIVITAMIN) capsule Take 1 capsule by mouth daily.  . prochlorperazine (COMPAZINE) 10 MG tablet Take 1 tablet (10 mg total) by mouth every 6 (six) hours as needed (Nausea or vomiting).  . ranitidine (ZANTAC) 150 MG capsule Take  150 mg by mouth daily.  Marland Kitchen dexamethasone (DECADRON) 4 MG tablet Take 5 tabs the night before and 5 tabs the morning of chemotherapy, every 3 weeks, take with food (Patient not taking: Reported on 04/19/2017)  . lidocaine-prilocaine (EMLA) cream Apply to affected area once (Patient not taking: Reported on 04/19/2017)  . LORazepam (ATIVAN) 0.5 MG tablet Take 1 tablet  (0.5 mg total) by mouth every 8 (eight) hours as needed for anxiety. (Patient not taking: Reported on 04/19/2017)  . ondansetron (ZOFRAN) 8 MG tablet Take 1 tablet (8 mg total) by mouth every 8 (eight) hours as needed for refractory nausea / vomiting. Start on day 3 after chemo. (Patient not taking: Reported on 04/19/2017)   No facility-administered encounter medications on file as of 04/19/2017.     Allergy:  Allergies  Allergen Reactions  . Ambien [Zolpidem Tartrate] Other (See Comments)    Other reaction(s): Hallucination  . Soma [Carisoprodol] Other (See Comments)    chills    Social Hx:   Social History   Socioeconomic History  . Marital status: Married    Spouse name: Not on file  . Number of children: 4  . Years of education: 8  . Highest education level: Not on file  Occupational History  . 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 Financial resource strain: Not on file  . Food insecurity:    Worry: Not on file    Inability: Not on file  . Transportation needs:    Medical: Not on file    Non-medical: Not on file  Tobacco Use  . Smoking status: Never Smoker  . Smokeless tobacco: Never Used  Substance and Sexual Activity  . Alcohol use: No    Alcohol/week: 0.0 oz  . Drug use: No  . Sexual activity: Not Currently    Partners: Male    Birth control/protection: Post-menopausal  Lifestyle  . Physical activity:    Days per week: Not on file    Minutes per session: Not on file  . Stress: Not on file  Relationships  . Social connections:    Talks on phone: Not on file    Gets together: Not on file    Attends religious service: Not on file    Active member of club or organization: Not on file    Attends meetings of clubs or organizations: Not on file    Relationship status: Not on file  . Intimate partner violence:    Fear of current or ex partner: Not on file    Emotionally abused: Not on file    Physically abused: Not on file    Forced sexual activity: Not on file   Other Topics Concern  . Not on file  Social History Narrative   Lives with daughter, Rebekah "Rebekah [name]"   Caffeine use: coffee/tea/soda daily    Past Surgical Hx:  Past Surgical History:  Procedure Laterality Date  . ABDOMINAL HYSTERECTOMY    . BREAST BIOPSY Right    biopsy in the 1990's  . BUNIONECTOMY Bilateral    2000's - R ONCE and L once  . CARPAL TUNNEL RELEASE Bilateral   . COLONSCOPY    . ENDOMETRIAL BIOPSY  02/2017  . ROBOTIC ASSISTED TOTAL HYSTERECTOMY WITH BILATERAL SALPINGO OOPHERECTOMY Bilateral 03/26/2017   Procedure: XI ROBOTIC ASSISTED TOTAL HYSTERECTOMY WITH BILATERAL SALPINGO OOPHORECTOMY AND SENTINAL LYMPH NODES;  Surgeon: Everitt Amber, MD;  Location: [facility];  Service: Gynecology;  Laterality: Bilateral;  . TUBAL LIGATION  1984  . TUBAL  LIGATION      Past Medical Hx:  Past Medical History:  Diagnosis Date  . Anxiety   . Arthritis   . Asthma    NO INHALERS USED  . Cancer ([place])    ENDOMETRIAL  . Depression   . Fibromyalgia   . GERD (gastroesophageal reflux disease)   . Insomnia   . Seizure disorder ([place])    LAST SEIZURE FEBRUARY 2019 SEES DR [facility]   . Seizures ([place])     Past Gynecological History:  SVD x 4 No LMP recorded. Patient has had a hysterectomy.  Family Hx:  Family History  Problem Relation Age of Onset  . Breast cancer Mother 80  . Diabetes Brother   . Hypertension Brother   . Stroke Brother   . Breast cancer Maternal Grandmother   . Breast cancer Maternal Aunt        x 3  . Colon cancer Paternal Aunt        x 2  . Colon cancer Paternal Uncle     Review of Systems:  Constitutional  Feels well,    ENT Normal appearing ears and nares bilaterally Skin/Breast  No rash, sores, jaundice, itching, dryness Cardiovascular  No chest pain, shortness of breath, or edema  Pulmonary  No cough or wheeze.  Gastro Intestinal  No nausea, vomitting, or diarrhoea. No bright red blood per rectum, no abdominal pain,  change in bowel movement, or constipation.  Genito Urinary  No frequency, urgency, dysuria, no bleeding, + discharge Musculo Skeletal  No myalgia, arthralgia, joint swelling or pain  Neurologic  No weakness, numbness, change in gait,  Psychology  No depression, anxiety, insomnia.   Vitals:  Blood pressure 128/89, pulse 94, resp. rate 20, SpO2 97 %.  Physical Exam: WD in NAD Neck  Supple NROM, without any enlargements.  Lymph Node Survey No cervical supraclavicular or inguinal adenopathy Cardiovascular  Pulse normal rate, regularity and rhythm. S1 and S2 normal.  Lungs  Clear to auscultation bilateraly, without wheezes/crackles/rhonchi. Good air movement.  Skin  No rash/lesions/breakdown  Psychiatry  Alert and oriented to person, place, and time  Abdomen  Normoactive bowel sounds, abdomen soft, non-tender and overweight but not obese without evidence of hernia. Well healed incisions.  Back No CVA tenderness Genito Urinary  Vaginal cuff with necrotic/devascularized tissue on posterior lip, but in tact palpably and visible suture line. No bleeding.  Rectal  deferred  Extremities  No bilateral cyanosis, clubbing or edema.  20 minutes of direct face to face counseling time was spent with the patient. This included discussion about prognosis, therapy recommendations and postoperative side effects and are beyond the scope of routine postoperative care.   Thereasa Solo, MD  04/19/2017, 2:51 PM

## 2017-04-19 NOTE — Assessment & Plan Note (Signed)
As above, I will refer her to see genetic counselor

## 2017-04-19 NOTE — Assessment & Plan Note (Addendum)
The patient has profound anxiety and difficulty coping with her condition I would refer her to talk to social worker for counseling and assistance with coping I also discussed the use of short-term lorazepam as needed for severe anxiety I warned her about risk of sedation while on lorazepam.

## 2017-04-19 NOTE — Progress Notes (Signed)
Rebekah [name] OFFICE PROGRESS NOTE  Patient Care Team: Wardell Honour, MD as PCP - General (Family Medicine)  ASSESSMENT & PLAN:  Endometrial cancer [facility]) We reviewed the NCCN guidelines We discussed the role of chemotherapy. The intent is of curative intent.  We discussed some of the risks, benefits, side-effects of carboplatin & Taxol. Treatment is intravenous, every 3 weeks x 6 cycles  Some of the short term side-effects included, though not limited to, including weight loss, life threatening infections, risk of allergic reactions, need for transfusions of blood products, nausea, vomiting, change in bowel habits, loss of hair, admission to hospital for various reasons, and risks of death.   Long term side-effects are also discussed including risks of infertility, permanent damage to nerve function, hearing loss, chronic fatigue, kidney damage with possibility needing hemodialysis, and rare secondary malignancy including bone marrow disorders.  The patient is aware that the response rates discussed earlier is not guaranteed.  After a long discussion, patient made an informed decision to proceed with the prescribed plan of care.   Patient education material was dispensed. We discussed premedication with dexamethasone before chemotherapy. Rebekah [name] is scheduled for port placement next week I will refer her to see genetic counselor for genetic testing I will see her back prior to cycle 2 of chemotherapy   Anxiety associated with depression The patient has profound anxiety and difficulty coping with her condition I would refer her to talk to social worker for counseling and assistance with coping I also discussed the use of short-term lorazepam as needed for severe anxiety I warned her about risk of sedation while on lorazepam.  Family history of cancer As above, I will refer her to see genetic counselor   Orders Placed This Encounter  Procedures  . Ambulatory referral to  Genetics    Referral Priority:   Routine    Referral Type:   Consultation    Referral Reason:   Specialty Services Required    Number of Visits Requested:   1  . Ambulatory Referral to Social Work    Referral Priority:   Routine    Referral Type:   Consultation    Referral Reason:   Specialty Services Required    Number of Visits Requested:   1    INTERVAL HISTORY: Please see below for problem oriented charting. Rebekah [name] returns with her daughter for further follow-up The patient is very nervous and upset at the prospect of soon Rebekah [name] denies other new symptoms Her daughter noted that the patient has difficulties coping  SUMMARY OF ONCOLOGIC HISTORY: Oncology History   Endometrioid with focal squamous  MSI stable     Endometrial cancer ([place])   02/20/2017 Pathology Results    Endometrium, biopsy - ENDOMETRIOID ADENOCARCINOMA, FIGO GRADE I, WITH SQUAMOUS DIFFERENTIATION. - SEE COMMENT      02/20/2017 Initial Diagnosis    Initially presented with PMB      03/26/2017 Pathology Results    1. Lymph node, sentinel, biopsy, right external iliac - LYMPH NODE, NEGATIVE FOR CARCINOMA (0/1). SEE NOTE 2. Lymph node, sentinel, biopsy, left external iliac - FOCUS OF METASTATIC CARCINOMA TO ONE LYMPH NODE, 0.3 MM (1/1). SEE NOTE 3. Uterus +/- tubes/ovaries, neoplastic, cervix - ENDOMETRIOID ADENOCARCINOMA WITH FOCAL SQUAMOUS DIFFERENTIATION, 3.5 CM, FIGO GRADE I, INVOLVING ANTERIOR AND POSTERIOR ENDOMETRIUM - TUMOR INVADES MORE THAN 90% OF MYOMETRIAL THICKNESS - LYMPHOVASCULAR INVASION IS IDENTIFIED - TUMOR INTRALUMINALLY EXTENDS INTO THE RIGHT FALLOPIAN TUBE; LEFT FALLOPIAN TUBE AND BILATERAL OVARIES ARE UNREMARKABLE -  SEE ONCOLOGY TABLE Microscopic Comment 3. ONCOLOGY TABLE-UTERUS, CARCINOMA OR CARCINOSARCOMA  Specimen: Uterus, tubes, ovaries and cervix Procedure: Total hysterectomy and bilateral salpingo-oophorectomy Lymph node sampling performed: Yes Specimen integrity: Intact Maximum  tumor size: 3.5 cm Histologic type: Endometrioid adenocarcinoma with focal squamous differentiation Grade: I Myometrial invasion: 1.4 cm where myometrium is 1.5 cm in thickness Uterine Serosa Involvement: Not identified Cervical stromal involvement: Not identified Extent of involvement of other organs: Right fallopian tube Lymph - vascular invasion: Present Lymph nodes: Examined: 2 Sentinel 0 Non-sentinel 2 Total Lymph nodes with metastasis: 1 Isolated tumor cells (< 0.2 mm): 0 TNM code: pT1b, pN9m FIGO Stage (based on pathologic findings, needs clinical correlation): IIIC      03/26/2017 Surgery    Pre-operative Diagnosis: endometrial cancer grade 1  Operation: Robotic-assisted laparoscopic total hysterectomy with bilateral salpingoophorectomy, SLN biopsy   Surgeon: RDonaciano Eva Operative Findings:  : normal 7cm uterus, tubes and ovaries, normal appearing nodes, no apparent extrauterine disease        04/11/2017 Cancer Staging    Staging form: Corpus Uteri - Carcinoma and Carcinosarcoma, AJCC 8th Edition - Pathologic: FIGO Stage IIIC1 (pT3a, pN1, cM0) - Signed by GHeath Lark MD on 04/11/2017       REVIEW OF SYSTEMS:   Constitutional: Denies fevers, chills or abnormal weight loss Eyes: Denies blurriness of vision Ears, nose, mouth, throat, and face: Denies mucositis or sore throat Respiratory: Denies cough, dyspnea or wheezes Cardiovascular: Denies palpitation, chest discomfort or lower extremity swelling Gastrointestinal:  Denies nausea, heartburn or change in bowel habits Skin: Denies abnormal skin rashes Lymphatics: Denies new lymphadenopathy or easy bruising Neurological:Denies numbness, tingling or new weaknesses  All other systems were reviewed with the patient and are negative.  I have reviewed the past medical history, past surgical history, social history and family history with the patient and they are unchanged from previous note.  ALLERGIES:   is allergic to aTeachers Insurance and Annuity Associationtartrate] and soma [carisoprodol].  MEDICATIONS:  Current Outpatient Medications  Medication Sig Dispense Refill  . citalopram (CELEXA) 20 MG tablet Take 1 tablet (20 mg total) by mouth daily. 90 tablet 0  . dexamethasone (DECADRON) 4 MG tablet Take 5 tabs the night before and 5 tabs the morning of chemotherapy, every 3 weeks, take with food (Patient not taking: Reported on 04/19/2017) 60 tablet 0  . diphenhydramine-acetaminophen (TYLENOL PM) 25-500 MG TABS tablet Take 1 tablet by mouth at bedtime.    . DULoxetine (CYMBALTA) 60 MG capsule Take 2 capsules (120 mg total) by mouth daily. (Patient taking differently: Take 60 mg by mouth 2 (two) times daily. ) 180 capsule 0  . levETIRAcetam (KEPPRA) 1000 MG tablet Take 1,500 mg by mouth 2 (two) times daily.    .Marland Kitchenlidocaine-prilocaine (EMLA) cream Apply to affected area once (Patient not taking: Reported on 04/19/2017) 30 g 3  . LORazepam (ATIVAN) 0.5 MG tablet Take 1 tablet (0.5 mg total) by mouth every 8 (eight) hours as needed for anxiety. (Patient not taking: Reported on 04/19/2017) 60 tablet 0  . Multiple Vitamin (MULTIVITAMIN) capsule Take 1 capsule by mouth daily.    . ondansetron (ZOFRAN) 8 MG tablet Take 1 tablet (8 mg total) by mouth every 8 (eight) hours as needed for refractory nausea / vomiting. Start on day 3 after chemo. (Patient not taking: Reported on 04/19/2017) 30 tablet 1  . prochlorperazine (COMPAZINE) 10 MG tablet Take 1 tablet (10 mg total) by mouth every 6 (six) hours as needed (Nausea or  vomiting). 30 tablet 1  . ranitidine (ZANTAC) 150 MG capsule Take 150 mg by mouth daily.     No current facility-administered medications for this visit.     PHYSICAL EXAMINATION: ECOG PERFORMANCE STATUS: 1 - Symptomatic but completely ambulatory  Vitals:   04/19/17 1331  BP: 109/88  Pulse: 80  Resp: 18  Temp: 98.6 F (37 C)  SpO2: 98%   Filed Weights   04/19/17 1331  Weight: 166 lb 3.2 oz (75.4 kg)     GENERAL:alert, no distress and comfortable.  Rebekah [name] is intermittently tearful NEURO: alert & oriented x 3 with fluent speech, no focal motor/sensory deficits  LABORATORY DATA:  I have reviewed the data as listed    Component Value Date/Time   NA 138 04/19/2017 1310   NA 140 11/07/2016 1551   K 4.1 04/19/2017 1310   CL 104 04/19/2017 1310   CO2 25 04/19/2017 1310   GLUCOSE 91 04/19/2017 1310   BUN 11 04/19/2017 1310   BUN 11 11/07/2016 1551   CREATININE 0.86 04/19/2017 1310   CREATININE 0.79 12/02/2015 1146   CALCIUM 9.6 04/19/2017 1310   PROT 7.1 04/19/2017 1310   PROT 6.7 11/07/2016 1551   ALBUMIN 3.8 04/19/2017 1310   ALBUMIN 4.4 11/07/2016 1551   AST 20 04/19/2017 1310   ALT 17 04/19/2017 1310   ALKPHOS 85 04/19/2017 1310   BILITOT 0.2 04/19/2017 1310   GFRNONAA >60 04/19/2017 1310   GFRAA >60 04/19/2017 1310    No results found for: SPEP, UPEP  Lab Results  Component Value Date   WBC 6.9 04/19/2017   NEUTROABS 3.7 04/19/2017   HGB 13.0 03/27/2017   HCT 41.7 04/19/2017   MCV 95.3 04/19/2017   PLT 309 04/19/2017      Chemistry      Component Value Date/Time   NA 138 04/19/2017 1310   NA 140 11/07/2016 1551   K 4.1 04/19/2017 1310   CL 104 04/19/2017 1310   CO2 25 04/19/2017 1310   BUN 11 04/19/2017 1310   BUN 11 11/07/2016 1551   CREATININE 0.86 04/19/2017 1310   CREATININE 0.79 12/02/2015 1146      Component Value Date/Time   CALCIUM 9.6 04/19/2017 1310   ALKPHOS 85 04/19/2017 1310   AST 20 04/19/2017 1310   ALT 17 04/19/2017 1310   BILITOT 0.2 04/19/2017 1310       RADIOGRAPHIC STUDIES: I have personally reviewed the radiological images as listed and agreed with the findings in the report. Ct Chest W Contrast  Result Date: 04/16/2017 CLINICAL DATA:  Staging endometrial cancer. EXAM: CT CHEST, ABDOMEN, AND PELVIS WITH CONTRAST TECHNIQUE: Multidetector CT imaging of the chest, abdomen and pelvis was performed following the standard protocol  during bolus administration of intravenous contrast. CONTRAST:  175m ISOVUE-300 IOPAMIDOL (ISOVUE-300) INJECTION 61% COMPARISON:  None. FINDINGS: CT CHEST FINDINGS Cardiovascular: The heart is normal in size. No pericardial effusion. The aorta is normal in caliber. Mild tortuosity but no dissection. The branch vessels are patent. No definite coronary artery calcifications. Mediastinum/Nodes: No mediastinal or hilar mass or adenopathy. Esophagus is grossly normal. Lungs/Pleura: No significant pulmonary findings. Patchy dependent subpleural atelectasis but no worrisome pulmonary lesions to suggest metastatic disease. No pleural effusion. Areas of mild bronchiectasis. Musculoskeletal: No breast masses, supraclavicular or axillary lymphadenopathy. Few small scattered lymph nodes are noted. The thyroid gland appears normal. No significant bony findings. CT ABDOMEN PELVIS FINDINGS Hepatobiliary: No focal hepatic lesions or intrahepatic biliary dilatation. The gallbladder appears normal. No  common bile duct dilatation. Pancreas: No mass, inflammation or ductal dilatation. Spleen: Normal size.  No focal lesions.  Accessory spleen noted. Adrenals/Urinary Tract: The adrenal glands are normal. Large simple left renal cyst. No worrisome renal lesions or hydronephrosis. The ureters and bladder are unremarkable. Stomach/Bowel: The stomach, duodenum, small bowel and colon are unremarkable. No acute inflammatory process, mass lesions or obstructive findings. The terminal ileum is normal. The appendix is not identified. Vascular/Lymphatic: The aorta and branch vessels are normal. The major venous structures are patent. No mesenteric or retroperitoneal mass or lymphadenopathy. There is an 8 mm lymph node just anterior to the right kidney in the anterior pararenal space on image 65. Smaller adjacent lymph nodes are also noted. There also small lymph nodes near the ascending colon on image number 70. No pelvic lymphadenopathy or  inguinal lymphadenopathy. Reproductive: Status post hysterectomy and bilateral oophorectomy and pelvic lymphadenectomy. Other: No pelvic mass or free pelvic fluid collections. There is a small amount of air noted in the vaginal cuff on the left. No abscess. Musculoskeletal: No significant bony findings. No worrisome bone lesions. IMPRESSION: 1. Status post hysterectomy and bilateral oophorectomy and pelvic lymphadenectomy. 2. A few small scattered lymph nodes are noted in the anterior pararenal space on the right along with a few small lymph nodes near the ascending colon. Recommend attention on future studies. No mesenteric or retroperitoneal mass. 3. No findings for metastatic disease involving the chest. Electronically Signed   By: Marijo Sanes M.D.   On: 04/16/2017 09:39   Ct Abdomen Pelvis W Contrast  Result Date: 04/16/2017 CLINICAL DATA:  Staging endometrial cancer. EXAM: CT CHEST, ABDOMEN, AND PELVIS WITH CONTRAST TECHNIQUE: Multidetector CT imaging of the chest, abdomen and pelvis was performed following the standard protocol during bolus administration of intravenous contrast. CONTRAST:  137m ISOVUE-300 IOPAMIDOL (ISOVUE-300) INJECTION 61% COMPARISON:  None. FINDINGS: CT CHEST FINDINGS Cardiovascular: The heart is normal in size. No pericardial effusion. The aorta is normal in caliber. Mild tortuosity but no dissection. The branch vessels are patent. No definite coronary artery calcifications. Mediastinum/Nodes: No mediastinal or hilar mass or adenopathy. Esophagus is grossly normal. Lungs/Pleura: No significant pulmonary findings. Patchy dependent subpleural atelectasis but no worrisome pulmonary lesions to suggest metastatic disease. No pleural effusion. Areas of mild bronchiectasis. Musculoskeletal: No breast masses, supraclavicular or axillary lymphadenopathy. Few small scattered lymph nodes are noted. The thyroid gland appears normal. No significant bony findings. CT ABDOMEN PELVIS FINDINGS  Hepatobiliary: No focal hepatic lesions or intrahepatic biliary dilatation. The gallbladder appears normal. No common bile duct dilatation. Pancreas: No mass, inflammation or ductal dilatation. Spleen: Normal size.  No focal lesions.  Accessory spleen noted. Adrenals/Urinary Tract: The adrenal glands are normal. Large simple left renal cyst. No worrisome renal lesions or hydronephrosis. The ureters and bladder are unremarkable. Stomach/Bowel: The stomach, duodenum, small bowel and colon are unremarkable. No acute inflammatory process, mass lesions or obstructive findings. The terminal ileum is normal. The appendix is not identified. Vascular/Lymphatic: The aorta and branch vessels are normal. The major venous structures are patent. No mesenteric or retroperitoneal mass or lymphadenopathy. There is an 8 mm lymph node just anterior to the right kidney in the anterior pararenal space on image 65. Smaller adjacent lymph nodes are also noted. There also small lymph nodes near the ascending colon on image number 70. No pelvic lymphadenopathy or inguinal lymphadenopathy. Reproductive: Status post hysterectomy and bilateral oophorectomy and pelvic lymphadenectomy. Other: No pelvic mass or free pelvic fluid collections. There is a small  amount of air noted in the vaginal cuff on the left. No abscess. Musculoskeletal: No significant bony findings. No worrisome bone lesions. IMPRESSION: 1. Status post hysterectomy and bilateral oophorectomy and pelvic lymphadenectomy. 2. A few small scattered lymph nodes are noted in the anterior pararenal space on the right along with a few small lymph nodes near the ascending colon. Recommend attention on future studies. No mesenteric or retroperitoneal mass. 3. No findings for metastatic disease involving the chest. Electronically Signed   By: Marijo Sanes M.D.   On: 04/16/2017 09:39    All questions were answered. The patient knows to call the clinic with any problems, questions or  concerns. No barriers to learning was detected.  I spent 30 minutes counseling the patient face to face. The total time spent in the appointment was 40 minutes and more than 50% was on counseling and review of test results  Heath Lark, MD 04/19/2017 5:00 PM

## 2017-04-22 ENCOUNTER — Telehealth: Payer: Self-pay | Admitting: Hematology and Oncology

## 2017-04-22 NOTE — Progress Notes (Signed)
Radiation Oncology         [phone] ________________________________  Initial outpatient Consultation  Name: Rebekah [name] MRN: [id number]  Date: 04/17/2017  DOB: [birth year]  BJ:[id number], Renette Butters, MD  Everitt Amber, MD   REFERRING PHYSICIAN: Everitt Amber, MD  DIAGNOSIS: Stage IIIC1, grade 1 endometrial cancer  HISTORY OF PRESENT ILLNESS::Rebekah [name] is a 62 y.o. female who presents with endometrial cancer. The patient presented to Dr. Talbert Nan on 02/20/17 with a one month history of light vaginal bleeding. An endometrial pipelle was performed at that time revealing grade 1 endometrial cancer. The patient then underwent robotic assisted total hysterectomy BSO and sentinel lymph node biopsy on 03/26/17. This showed endometrioid adenocarcinoma with focal squamous differentiation, 3.5 cm, with FIGO grade I involving anterior and posterior endometrium. The tumor invaded more than 90% of the myometrial thickness. Lymphovascular invasion is identified. The tumor intraluminally extends into the right fallopian tube. Biopsy of the left external iliac lymph node showed metastatic carcinoma (1/1). Biopsy of the right external iliac was negative for carcinoma. The patient presented to Dr. Denman George on 04/19/17. Per her note, she is recommending chemotherapy x 6 cycles and vaginal brachytherapy. The patient presents to radiation oncology for evaluation for brachytherapy.  The patient reports a 5 lb weight loss since surgery. She denies bowel/bladder complaints, nausea/vomiting, or pain at this time.  PREVIOUS RADIATION THERAPY: No  PAST MEDICAL HISTORY:  has a past medical history of Anxiety, Arthritis, Asthma, Cancer ([place]), Depression, Fibromyalgia, GERD (gastroesophageal reflux disease), Insomnia, Seizure disorder ([place]), and Seizures ([place]).    PAST SURGICAL HISTORY: Past Surgical History:  Procedure Laterality Date  . ABDOMINAL HYSTERECTOMY    . BREAST BIOPSY Right    biopsy in the  1990's  . BUNIONECTOMY Bilateral    2000's - R ONCE and L once  . CARPAL TUNNEL RELEASE Bilateral   . COLONSCOPY    . ENDOMETRIAL BIOPSY  02/2017  . ROBOTIC ASSISTED TOTAL HYSTERECTOMY WITH BILATERAL SALPINGO OOPHERECTOMY Bilateral 03/26/2017   Procedure: XI ROBOTIC ASSISTED TOTAL HYSTERECTOMY WITH BILATERAL SALPINGO OOPHORECTOMY AND SENTINAL LYMPH NODES;  Surgeon: Everitt Amber, MD;  Location: [facility];  Service: Gynecology;  Laterality: Bilateral;  . TUBAL LIGATION  1984  . TUBAL LIGATION      FAMILY HISTORY: family history includes Breast cancer in her maternal aunt and maternal grandmother; Breast cancer (age of onset: 44) in her mother; Colon cancer in her paternal aunt and paternal uncle; Diabetes in her brother; Hypertension in her brother; Stroke in her brother.  SOCIAL HISTORY:  reports that she has never smoked. She has never used smokeless tobacco. She reports that she does not drink alcohol or use drugs.  ALLERGIES: Ambien [zolpidem tartrate] and Soma [carisoprodol]  MEDICATIONS:  Current Outpatient Medications  Medication Sig Dispense Refill  . citalopram (CELEXA) 20 MG tablet Take 1 tablet (20 mg total) by mouth daily. 90 tablet 0  . diphenhydramine-acetaminophen (TYLENOL PM) 25-500 MG TABS tablet Take 1 tablet by mouth at bedtime.    . DULoxetine (CYMBALTA) 60 MG capsule Take 2 capsules (120 mg total) by mouth daily. (Patient taking differently: Take 60 mg by mouth 2 (two) times daily. ) 180 capsule 0  . levETIRAcetam (KEPPRA) 1000 MG tablet Take 1,500 mg by mouth 2 (two) times daily.    . Multiple Vitamin (MULTIVITAMIN) capsule Take 1 capsule by mouth daily.    . ranitidine (ZANTAC) 150 MG capsule Take 150 mg by mouth daily.    Marland Kitchen dexamethasone (DECADRON) 4 MG tablet  Take 5 tabs the night before and 5 tabs the morning of chemotherapy, every 3 weeks, take with food (Patient not taking: Reported on 04/19/2017) 60 tablet 0  . lidocaine-prilocaine (EMLA) cream Apply to affected area  once (Patient not taking: Reported on 04/19/2017) 30 g 3  . LORazepam (ATIVAN) 0.5 MG tablet Take 1 tablet (0.5 mg total) by mouth every 8 (eight) hours as needed for anxiety. (Patient not taking: Reported on 04/19/2017) 60 tablet 0  . ondansetron (ZOFRAN) 8 MG tablet Take 1 tablet (8 mg total) by mouth every 8 (eight) hours as needed for refractory nausea / vomiting. Start on day 3 after chemo. (Patient not taking: Reported on 04/19/2017) 30 tablet 1  . prochlorperazine (COMPAZINE) 10 MG tablet Take 1 tablet (10 mg total) by mouth every 6 (six) hours as needed (Nausea or vomiting). 30 tablet 1   No current facility-administered medications for this encounter.     REVIEW OF SYSTEMS: A 10+ POINT REVIEW OF SYSTEMS WAS OBTAINED including neurology, dermatology, psychiatry, cardiac, respiratory, lymph, extremities, GI, GU, musculoskeletal, constitutional, reproductive, HEENT. All pertinent positives are noted in the HPI. All others are negative.    PHYSICAL EXAM:  height is 4\' 11"  (1.499 m) and weight is 167 lb 6.4 oz (75.9 kg). Her oral temperature is 97.8 F (36.6 C). Her blood pressure is 133/92 (abnormal) and her pulse is 104 (abnormal). Her oxygen saturation is 96%.   General: Alert and oriented, in no acute distress HEENT: Head is normocephalic. Extraocular movements are intact. Oropharynx is clear. Neck: Neck is supple, no palpable cervical or supraclavicular lymphadenopathy. Heart: Regular in rate and rhythm with no murmurs, rubs, or gallops. Chest: Clear to auscultation bilaterally, with no rhonchi, wheezes, or rales. Abdomen: Soft, nontender, nondistended, with no rigidity or guarding.  Extremities: No cyanosis or edema. Lymphatics: see Neck Exam Skin: No concerning lesions. Musculoskeletal: symmetric strength and muscle tone throughout. Neurologic: Cranial nerves II through XII are grossly intact. No obvious focalities. Speech is fluent. Coordination is intact. Psychiatric: Judgment and  insight are intact. Affect is appropriate. Pelvic exam deferred until simulation and planning day   ECOG = 1  0 - Asymptomatic (Fully active, able to carry on all predisease activities without restriction)  1 - Symptomatic but completely ambulatory (Restricted in physically strenuous activity but ambulatory and able to carry out work of a light or sedentary nature. For example, light housework, office work)  2 - Symptomatic, <50% in bed during the day (Ambulatory and capable of all self care but unable to carry out any work activities. Up and about more than 50% of waking hours)  3 - Symptomatic, >50% in bed, but not bedbound (Capable of only limited self-care, confined to bed or chair 50% or more of waking hours)  4 - Bedbound (Completely disabled. Cannot carry on any self-care. Totally confined to bed or chair)  5 - Death   Eustace Pen MM, Creech RH, Tormey DC, et al. [phone]). "Toxicity and response criteria of the [facility]". [place] Oncol. 5 (6): 649-55  LABORATORY DATA:  Lab Results  Component Value Date   WBC 6.9 04/19/2017   HGB 13.0 03/27/2017   HCT 41.7 04/19/2017   MCV 95.3 04/19/2017   PLT 309 04/19/2017   NEUTROABS 3.7 04/19/2017   Lab Results  Component Value Date   NA 138 04/19/2017   K 4.1 04/19/2017   CL 104 04/19/2017   CO2 25 04/19/2017   GLUCOSE 91 04/19/2017  CREATININE 0.86 04/19/2017   CALCIUM 9.6 04/19/2017      RADIOGRAPHY: Ct Chest W Contrast  Result Date: 04/16/2017 CLINICAL DATA:  Staging endometrial cancer. EXAM: CT CHEST, ABDOMEN, AND PELVIS WITH CONTRAST TECHNIQUE: Multidetector CT imaging of the chest, abdomen and pelvis was performed following the standard protocol during bolus administration of intravenous contrast. CONTRAST:  11mL ISOVUE-300 IOPAMIDOL (ISOVUE-300) INJECTION 61% COMPARISON:  None. FINDINGS: CT CHEST FINDINGS Cardiovascular: The heart is normal in size. No pericardial effusion. The aorta is normal in  caliber. Mild tortuosity but no dissection. The branch vessels are patent. No definite coronary artery calcifications. Mediastinum/Nodes: No mediastinal or hilar mass or adenopathy. Esophagus is grossly normal. Lungs/Pleura: No significant pulmonary findings. Patchy dependent subpleural atelectasis but no worrisome pulmonary lesions to suggest metastatic disease. No pleural effusion. Areas of mild bronchiectasis. Musculoskeletal: No breast masses, supraclavicular or axillary lymphadenopathy. Few small scattered lymph nodes are noted. The thyroid gland appears normal. No significant bony findings. CT ABDOMEN PELVIS FINDINGS Hepatobiliary: No focal hepatic lesions or intrahepatic biliary dilatation. The gallbladder appears normal. No common bile duct dilatation. Pancreas: No mass, inflammation or ductal dilatation. Spleen: Normal size.  No focal lesions.  Accessory spleen noted. Adrenals/Urinary Tract: The adrenal glands are normal. Large simple left renal cyst. No worrisome renal lesions or hydronephrosis. The ureters and bladder are unremarkable. Stomach/Bowel: The stomach, duodenum, small bowel and colon are unremarkable. No acute inflammatory process, mass lesions or obstructive findings. The terminal ileum is normal. The appendix is not identified. Vascular/Lymphatic: The aorta and branch vessels are normal. The major venous structures are patent. No mesenteric or retroperitoneal mass or lymphadenopathy. There is an 8 mm lymph node just anterior to the right kidney in the anterior pararenal space on image 65. Smaller adjacent lymph nodes are also noted. There also small lymph nodes near the ascending colon on image number 70. No pelvic lymphadenopathy or inguinal lymphadenopathy. Reproductive: Status post hysterectomy and bilateral oophorectomy and pelvic lymphadenectomy. Other: No pelvic mass or free pelvic fluid collections. There is a small amount of air noted in the vaginal cuff on the left. No abscess.  Musculoskeletal: No significant bony findings. No worrisome bone lesions. IMPRESSION: 1. Status post hysterectomy and bilateral oophorectomy and pelvic lymphadenectomy. 2. A few small scattered lymph nodes are noted in the anterior pararenal space on the right along with a few small lymph nodes near the ascending colon. Recommend attention on future studies. No mesenteric or retroperitoneal mass. 3. No findings for metastatic disease involving the chest. Electronically Signed   By: Marijo Sanes M.D.   On: 04/16/2017 09:39   Ct Abdomen Pelvis W Contrast  Result Date: 04/16/2017 CLINICAL DATA:  Staging endometrial cancer. EXAM: CT CHEST, ABDOMEN, AND PELVIS WITH CONTRAST TECHNIQUE: Multidetector CT imaging of the chest, abdomen and pelvis was performed following the standard protocol during bolus administration of intravenous contrast. CONTRAST:  114mL ISOVUE-300 IOPAMIDOL (ISOVUE-300) INJECTION 61% COMPARISON:  None. FINDINGS: CT CHEST FINDINGS Cardiovascular: The heart is normal in size. No pericardial effusion. The aorta is normal in caliber. Mild tortuosity but no dissection. The branch vessels are patent. No definite coronary artery calcifications. Mediastinum/Nodes: No mediastinal or hilar mass or adenopathy. Esophagus is grossly normal. Lungs/Pleura: No significant pulmonary findings. Patchy dependent subpleural atelectasis but no worrisome pulmonary lesions to suggest metastatic disease. No pleural effusion. Areas of mild bronchiectasis. Musculoskeletal: No breast masses, supraclavicular or axillary lymphadenopathy. Few small scattered lymph nodes are noted. The thyroid gland appears normal. No significant bony findings. CT  ABDOMEN PELVIS FINDINGS Hepatobiliary: No focal hepatic lesions or intrahepatic biliary dilatation. The gallbladder appears normal. No common bile duct dilatation. Pancreas: No mass, inflammation or ductal dilatation. Spleen: Normal size.  No focal lesions.  Accessory spleen noted.  Adrenals/Urinary Tract: The adrenal glands are normal. Large simple left renal cyst. No worrisome renal lesions or hydronephrosis. The ureters and bladder are unremarkable. Stomach/Bowel: The stomach, duodenum, small bowel and colon are unremarkable. No acute inflammatory process, mass lesions or obstructive findings. The terminal ileum is normal. The appendix is not identified. Vascular/Lymphatic: The aorta and branch vessels are normal. The major venous structures are patent. No mesenteric or retroperitoneal mass or lymphadenopathy. There is an 8 mm lymph node just anterior to the right kidney in the anterior pararenal space on image 65. Smaller adjacent lymph nodes are also noted. There also small lymph nodes near the ascending colon on image number 70. No pelvic lymphadenopathy or inguinal lymphadenopathy. Reproductive: Status post hysterectomy and bilateral oophorectomy and pelvic lymphadenectomy. Other: No pelvic mass or free pelvic fluid collections. There is a small amount of air noted in the vaginal cuff on the left. No abscess. Musculoskeletal: No significant bony findings. No worrisome bone lesions. IMPRESSION: 1. Status post hysterectomy and bilateral oophorectomy and pelvic lymphadenectomy. 2. A few small scattered lymph nodes are noted in the anterior pararenal space on the right along with a few small lymph nodes near the ascending colon. Recommend attention on future studies. No mesenteric or retroperitoneal mass. 3. No findings for metastatic disease involving the chest. Electronically Signed   By: Marijo Sanes M.D.   On: 04/16/2017 09:39      IMPRESSION: Stage IIIC1, grade 1 endometrial cancer. Given the deeply invasive tumor and presence of LVI the patient would be at risk for vaginal cuff recurrence and I would agree with Dr. Serita Grit recommendation for vaginal cuff HDR brachytherapy.  I discussed the course of treatment, side effects, and potential toxicities with the patient. She appears to  understand and wishes to proceed. A consent form was signed and a copy was placed in the patient's chart.   PLAN: CT simulation and treatment planning will be  scheduled for during the second or third cycle of chemotherapy. At least 8 weeks post-op in light of healing issues along vaginal cuff.     ------------------------------------------------  Blair Promise, PhD, MD  This document serves as a record of services personally performed by Gery Pray, MD. It was created on his behalf by Bethann Humble, a trained medical scribe. The creation of this record is based on the scribe's personal observations and the provider's statements to them. This document has been checked and approved by the attending provider.

## 2017-04-22 NOTE — Telephone Encounter (Signed)
Spoke to patients daughter regarding upcoming may appointments.

## 2017-04-23 ENCOUNTER — Other Ambulatory Visit: Payer: Self-pay | Admitting: Radiology

## 2017-04-23 ENCOUNTER — Encounter: Payer: Self-pay | Admitting: Family Medicine

## 2017-04-24 ENCOUNTER — Other Ambulatory Visit: Payer: Self-pay | Admitting: Hematology and Oncology

## 2017-04-24 ENCOUNTER — Encounter (HOSPITAL_COMMUNITY): Payer: Self-pay

## 2017-04-24 ENCOUNTER — Ambulatory Visit (HOSPITAL_COMMUNITY)
Admission: RE | Admit: 2017-04-24 | Discharge: 2017-04-24 | Disposition: A | Discharge status: Home / Self Care | Payer: BLUE CROSS/BLUE SHIELD | Source: Ambulatory Visit | Attending: Hematology and Oncology | Admitting: Hematology and Oncology

## 2017-04-24 DIAGNOSIS — F419 Anxiety disorder, unspecified: Secondary | ICD-10-CM | POA: Diagnosis not present

## 2017-04-24 DIAGNOSIS — Z803 Family history of malignant neoplasm of breast: Secondary | ICD-10-CM | POA: Diagnosis not present

## 2017-04-24 DIAGNOSIS — Z90710 Acquired absence of both cervix and uterus: Secondary | ICD-10-CM | POA: Insufficient documentation

## 2017-04-24 DIAGNOSIS — Z888 Allergy status to other drugs, medicaments and biological substances status: Secondary | ICD-10-CM | POA: Diagnosis not present

## 2017-04-24 DIAGNOSIS — M797 Fibromyalgia: Secondary | ICD-10-CM | POA: Diagnosis not present

## 2017-04-24 DIAGNOSIS — G4700 Insomnia, unspecified: Secondary | ICD-10-CM | POA: Diagnosis not present

## 2017-04-24 DIAGNOSIS — C541 Malignant neoplasm of endometrium: Secondary | ICD-10-CM | POA: Diagnosis not present

## 2017-04-24 DIAGNOSIS — Z5111 Encounter for antineoplastic chemotherapy: Secondary | ICD-10-CM | POA: Diagnosis not present

## 2017-04-24 DIAGNOSIS — Z9889 Other specified postprocedural states: Secondary | ICD-10-CM | POA: Insufficient documentation

## 2017-04-24 DIAGNOSIS — G40909 Epilepsy, unspecified, not intractable, without status epilepticus: Secondary | ICD-10-CM | POA: Diagnosis not present

## 2017-04-24 DIAGNOSIS — F329 Major depressive disorder, single episode, unspecified: Secondary | ICD-10-CM | POA: Diagnosis not present

## 2017-04-24 DIAGNOSIS — Z79899 Other long term (current) drug therapy: Secondary | ICD-10-CM | POA: Insufficient documentation

## 2017-04-24 DIAGNOSIS — K219 Gastro-esophageal reflux disease without esophagitis: Secondary | ICD-10-CM | POA: Diagnosis not present

## 2017-04-24 DIAGNOSIS — Z800 Family history of malignant neoplasm of digestive organs: Secondary | ICD-10-CM | POA: Insufficient documentation

## 2017-04-24 DIAGNOSIS — Z90722 Acquired absence of ovaries, bilateral: Secondary | ICD-10-CM | POA: Diagnosis not present

## 2017-04-24 HISTORY — PX: IR US GUIDE VASC ACCESS RIGHT: IMG2390

## 2017-04-24 HISTORY — PX: IR FLUORO GUIDE PORT INSERTION RIGHT: IMG5741

## 2017-04-24 LAB — BASIC METABOLIC PANEL
Anion gap: 11 (ref 5–15)
BUN: 13 mg/dL (ref 6–20)
CALCIUM: 8.9 mg/dL (ref 8.9–10.3)
CO2: 23 mmol/L (ref 22–32)
CREATININE: 0.82 mg/dL (ref 0.44–1.00)
Chloride: 105 mmol/L (ref 101–111)
Glucose, Bld: 98 mg/dL (ref 65–99)
Potassium: 4.1 mmol/L (ref 3.5–5.1)
SODIUM: 139 mmol/L (ref 135–145)

## 2017-04-24 LAB — CBC
HCT: 41.5 % (ref 36.0–46.0)
Hemoglobin: 13.8 g/dL (ref 12.0–15.0)
MCH: 32 pg (ref 26.0–34.0)
MCHC: 33.3 g/dL (ref 30.0–36.0)
MCV: 96.3 fL (ref 78.0–100.0)
PLATELETS: 292 10*3/uL (ref 150–400)
RBC: 4.31 MIL/uL (ref 3.87–5.11)
RDW: 12.5 % (ref 11.5–15.5)
WBC: 6.4 10*3/uL (ref 4.0–10.5)

## 2017-04-24 LAB — PROTIME-INR
INR: 0.92
PROTHROMBIN TIME: 12.2 s (ref 11.4–15.2)

## 2017-04-24 MED ORDER — LIDOCAINE-EPINEPHRINE (PF) 1 %-1:200000 IJ SOLN
INTRAMUSCULAR | Status: AC | PRN
  Administered 2017-04-24: 10 mL

## 2017-04-24 MED ORDER — HEPARIN SOD (PORK) LOCK FLUSH 100 UNIT/ML IV SOLN
INTRAVENOUS | Status: AC
  Filled 2017-04-24: qty 5

## 2017-04-24 MED ORDER — LIDOCAINE HCL 1 % IJ SOLN
INTRAMUSCULAR | Status: AC
  Filled 2017-04-24: qty 20

## 2017-04-24 MED ORDER — MIDAZOLAM HCL 2 MG/2ML IJ SOLN
INTRAMUSCULAR | Status: AC
  Filled 2017-04-24: qty 2

## 2017-04-24 MED ORDER — CEFAZOLIN SODIUM-DEXTROSE 2-4 GM/100ML-% IV SOLN
INTRAVENOUS | Status: AC
  Administered 2017-04-24: 2 g
  Filled 2017-04-24: qty 100

## 2017-04-24 MED ORDER — SODIUM CHLORIDE 0.9 % IV SOLN: INTRAVENOUS | Status: DC

## 2017-04-24 MED ORDER — LIDOCAINE-EPINEPHRINE (PF) 1 %-1:200000 IJ SOLN
INTRAMUSCULAR | Status: AC
  Filled 2017-04-24: qty 30

## 2017-04-24 MED ORDER — FENTANYL CITRATE (PF) 100 MCG/2ML IJ SOLN
INTRAMUSCULAR | Status: AC
  Filled 2017-04-24: qty 2

## 2017-04-24 MED ORDER — FENTANYL CITRATE (PF) 100 MCG/2ML IJ SOLN
INTRAMUSCULAR | Status: AC | PRN
  Administered 2017-04-24 (×2): 50 ug via INTRAVENOUS

## 2017-04-24 MED ORDER — CEFAZOLIN SODIUM-DEXTROSE 2-4 GM/100ML-% IV SOLN: 2.0000 g | INTRAVENOUS | Status: DC

## 2017-04-24 MED ORDER — MIDAZOLAM HCL 2 MG/2ML IJ SOLN
INTRAMUSCULAR | Status: AC | PRN
  Administered 2017-04-24 (×2): 1 mg via INTRAVENOUS

## 2017-04-24 MED FILL — LORazepam 0.5 MG TABS: 0.5 | 20 days supply | Qty: 60 | Fill #0

## 2017-04-24 NOTE — Sedation Documentation (Signed)
Patient complaining of pain. See orders.

## 2017-04-24 NOTE — Sedation Documentation (Signed)
Patient denies pain and is resting comfortably.  

## 2017-04-24 NOTE — Sedation Documentation (Signed)
Patient is resting comfortably. 

## 2017-04-24 NOTE — H&P (Signed)
Chief Complaint: Endometrial cancer  Referring Physician(s): [place]  Supervising Physician: Jacqulynn Cadet  Patient Status: [facility] - Out-pt  History of Present Illness: Rebekah [name] is a 62 y.o. female who was initially seen by Dr. Denman George for vaginal bleeding.  Endometrial biopsy was done which revealed endometrial cancer.  She underwent arobotic assisted hysterectomy bilateral salpingo-nephrectomy and pelvic lymphadenectomy on 03/26/2017.  She is here today for placement of a [name].  She is NPO. No blood thinners.   Past Medical History:  Diagnosis Date  . Anxiety   . Arthritis   . Asthma    NO INHALERS USED  . Cancer ([place])    ENDOMETRIAL  . Depression   . Fibromyalgia   . GERD (gastroesophageal reflux disease)   . Insomnia   . Seizure disorder ([place])    LAST SEIZURE FEBRUARY 2019 SEES DR [facility]   . Seizures ([place])     Past Surgical History:  Procedure Laterality Date  . ABDOMINAL HYSTERECTOMY    . BREAST BIOPSY Right    biopsy in the 1990's  . BUNIONECTOMY Bilateral    2000's - R ONCE and L once  . CARPAL TUNNEL RELEASE Bilateral   . COLONSCOPY    . ENDOMETRIAL BIOPSY  02/2017  . ROBOTIC ASSISTED TOTAL HYSTERECTOMY WITH BILATERAL SALPINGO OOPHERECTOMY Bilateral 03/26/2017   Procedure: XI ROBOTIC ASSISTED TOTAL HYSTERECTOMY WITH BILATERAL SALPINGO OOPHORECTOMY AND SENTINAL LYMPH NODES;  Surgeon: Everitt Amber, MD;  Location: [facility] ORS;  Service: Gynecology;  Laterality: Bilateral;  . TUBAL LIGATION  1984  . TUBAL LIGATION      Allergies: Ambien [zolpidem tartrate] and Soma [carisoprodol]  Medications: Prior to Admission medications   Medication Sig Start Date End Date Taking? Authorizing Provider  citalopram (CELEXA) 20 MG tablet Take 1 tablet (20 mg total) by mouth daily. 04/16/17  Yes Gorsuch, Ni, MD  diphenhydramine-acetaminophen (TYLENOL PM) 25-500 MG TABS tablet Take 1 tablet by mouth at bedtime.   Yes [provider]  DULoxetine (CYMBALTA) 60 MG capsule Take 2 capsules (120 mg total) by mouth daily. Patient taking differently: Take 60 mg by mouth 2 (two) times daily.  03/10/17  Yes Wardell Honour, MD  levETIRAcetam (KEPPRA) 1000 MG tablet Take 1,500 mg by mouth 2 (two) times daily.   Yes [provider]  Multiple Vitamin (MULTIVITAMIN) capsule Take 1 capsule by mouth daily.   Yes [provider]  ranitidine (ZANTAC) 150 MG capsule Take 150 mg by mouth daily.   Yes [provider]  dexamethasone (DECADRON) 4 MG tablet Take 5 tabs the night before and 5 tabs the morning of chemotherapy, every 3 weeks, take with food Patient not taking: Reported on 04/19/2017 04/19/17   Heath Lark, MD  lidocaine-prilocaine (EMLA) cream Apply to affected area once Patient not taking: Reported on 04/19/2017 04/19/17   Heath Lark, MD  LORazepam (ATIVAN) 0.5 MG tablet Take 1 tablet (0.5 mg total) by mouth every 8 (eight) hours as needed for anxiety. Patient not taking: Reported on 04/19/2017 04/19/17   Heath Lark, MD  ondansetron (ZOFRAN) 8 MG tablet Take 1 tablet (8 mg total) by mouth every 8 (eight) hours as needed for refractory nausea / vomiting. Start on day 3 after chemo. Patient not taking: Reported on 04/19/2017 04/19/17   Heath Lark, MD  prochlorperazine (COMPAZINE) 10 MG tablet Take 1 tablet (10 mg total) by mouth every 6 (six) hours as needed (Nausea or vomiting). 04/19/17   Heath Lark, MD  Family History  Problem Relation Age of Onset  . Breast cancer Mother 33  . Diabetes Brother   . Hypertension Brother   . Stroke Brother   . Breast cancer Maternal Grandmother   . Breast cancer Maternal Aunt        x 3  . Colon cancer Paternal Aunt        x 2  . Colon cancer Paternal Uncle     Social History   Socioeconomic History  . Marital status: Married    Spouse name: Not on file  . Number of children: 4  . Years of education: 8  . Highest education level: Not on file    Occupational History  . 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 Financial resource strain: Not on file  . Food insecurity:    Worry: Not on file    Inability: Not on file  . Transportation needs:    Medical: Not on file    Non-medical: Not on file  Tobacco Use  . Smoking status: Never Smoker  . Smokeless tobacco: Never Used  Substance and Sexual Activity  . Alcohol use: No    Alcohol/week: 0.0 oz  . Drug use: No  . Sexual activity: Not Currently    Partners: Male    Birth control/protection: Post-menopausal  Lifestyle  . Physical activity:    Days per week: Not on file    Minutes per session: Not on file  . Stress: Not on file  Relationships  . Social connections:    Talks on phone: Not on file    Gets together: Not on file    Attends religious service: Not on file    Active member of club or organization: Not on file    Attends meetings of clubs or organizations: Not on file    Relationship status: Not on file  Other Topics Concern  . Not on file  Social History Narrative   Lives with daughter, [name]   Caffeine use: coffee/tea/soda daily    Review of Systems: A 12 point ROS discussed and pertinent positives are indicated in the HPI above.  All other systems are negative. Review of Systems  Vital Signs: BP 112/85   Pulse 83   Temp 98.6 F (37 C) (Oral)   Ht 4\' 11"  (1.499 m)   Wt 166 lb (75.3 kg)   SpO2 94%   BMI 33.53 kg/m   Physical Exam  Constitutional: She is oriented to person, place, and time. She appears well-developed.  HENT:  Head: Normocephalic and atraumatic.  Eyes: EOM are normal.  Neck: Normal range of motion.  Cardiovascular: Normal rate and regular rhythm.  Pulmonary/Chest: Effort normal and breath sounds normal.  Abdominal: She exhibits no distension.  Neurological: She is alert and oriented to person, place, and time.  Skin: Skin is warm and dry.  Psychiatric: She has a normal mood and affect. Her behavior is normal.  Judgment and thought content normal.  Vitals reviewed.   Imaging: Ct Chest W Contrast  Result Date: 04/16/2017 CLINICAL DATA:  Staging endometrial cancer. EXAM: CT CHEST, ABDOMEN, AND PELVIS WITH CONTRAST TECHNIQUE: Multidetector CT imaging of the chest, abdomen and pelvis was performed following the standard protocol during bolus administration of intravenous contrast. CONTRAST:  125mL ISOVUE-300 IOPAMIDOL (ISOVUE-300) INJECTION 61% COMPARISON:  None. FINDINGS: CT CHEST FINDINGS Cardiovascular: The heart is normal in size. No pericardial effusion. The aorta is normal in caliber. Mild tortuosity but no dissection. The branch vessels are patent. No  definite coronary artery calcifications. Mediastinum/Nodes: No mediastinal or hilar mass or adenopathy. Esophagus is grossly normal. Lungs/Pleura: No significant pulmonary findings. Patchy dependent subpleural atelectasis but no worrisome pulmonary lesions to suggest metastatic disease. No pleural effusion. Areas of mild bronchiectasis. Musculoskeletal: No breast masses, supraclavicular or axillary lymphadenopathy. Few small scattered lymph nodes are noted. The thyroid gland appears normal. No significant bony findings. CT ABDOMEN PELVIS FINDINGS Hepatobiliary: No focal hepatic lesions or intrahepatic biliary dilatation. The gallbladder appears normal. No common bile duct dilatation. Pancreas: No mass, inflammation or ductal dilatation. Spleen: Normal size.  No focal lesions.  Accessory spleen noted. Adrenals/Urinary Tract: The adrenal glands are normal. Large simple left renal cyst. No worrisome renal lesions or hydronephrosis. The ureters and bladder are unremarkable. Stomach/Bowel: The stomach, duodenum, small bowel and colon are unremarkable. No acute inflammatory process, mass lesions or obstructive findings. The terminal ileum is normal. The appendix is not identified. Vascular/Lymphatic: The aorta and branch vessels are normal. The major venous structures are  patent. No mesenteric or retroperitoneal mass or lymphadenopathy. There is an 8 mm lymph node just anterior to the right kidney in the anterior pararenal space on image 65. Smaller adjacent lymph nodes are also noted. There also small lymph nodes near the ascending colon on image number 70. No pelvic lymphadenopathy or inguinal lymphadenopathy. Reproductive: Status post hysterectomy and bilateral oophorectomy and pelvic lymphadenectomy. Other: No pelvic mass or free pelvic fluid collections. There is a small amount of air noted in the vaginal cuff on the left. No abscess. Musculoskeletal: No significant bony findings. No worrisome bone lesions. IMPRESSION: 1. Status post hysterectomy and bilateral oophorectomy and pelvic lymphadenectomy. 2. A few small scattered lymph nodes are noted in the anterior pararenal space on the right along with a few small lymph nodes near the ascending colon. Recommend attention on future studies. No mesenteric or retroperitoneal mass. 3. No findings for metastatic disease involving the chest. Electronically Signed   By: Marijo Sanes M.D.   On: 04/16/2017 09:39   Ct Abdomen Pelvis W Contrast  Result Date: 04/16/2017 CLINICAL DATA:  Staging endometrial cancer. EXAM: CT CHEST, ABDOMEN, AND PELVIS WITH CONTRAST TECHNIQUE: Multidetector CT imaging of the chest, abdomen and pelvis was performed following the standard protocol during bolus administration of intravenous contrast. CONTRAST:  179mL ISOVUE-300 IOPAMIDOL (ISOVUE-300) INJECTION 61% COMPARISON:  None. FINDINGS: CT CHEST FINDINGS Cardiovascular: The heart is normal in size. No pericardial effusion. The aorta is normal in caliber. Mild tortuosity but no dissection. The branch vessels are patent. No definite coronary artery calcifications. Mediastinum/Nodes: No mediastinal or hilar mass or adenopathy. Esophagus is grossly normal. Lungs/Pleura: No significant pulmonary findings. Patchy dependent subpleural atelectasis but no  worrisome pulmonary lesions to suggest metastatic disease. No pleural effusion. Areas of mild bronchiectasis. Musculoskeletal: No breast masses, supraclavicular or axillary lymphadenopathy. Few small scattered lymph nodes are noted. The thyroid gland appears normal. No significant bony findings. CT ABDOMEN PELVIS FINDINGS Hepatobiliary: No focal hepatic lesions or intrahepatic biliary dilatation. The gallbladder appears normal. No common bile duct dilatation. Pancreas: No mass, inflammation or ductal dilatation. Spleen: Normal size.  No focal lesions.  Accessory spleen noted. Adrenals/Urinary Tract: The adrenal glands are normal. Large simple left renal cyst. No worrisome renal lesions or hydronephrosis. The ureters and bladder are unremarkable. Stomach/Bowel: The stomach, duodenum, small bowel and colon are unremarkable. No acute inflammatory process, mass lesions or obstructive findings. The terminal ileum is normal. The appendix is not identified. Vascular/Lymphatic: The aorta and branch vessels are normal.  The major venous structures are patent. No mesenteric or retroperitoneal mass or lymphadenopathy. There is an 8 mm lymph node just anterior to the right kidney in the anterior pararenal space on image 65. Smaller adjacent lymph nodes are also noted. There also small lymph nodes near the ascending colon on image number 70. No pelvic lymphadenopathy or inguinal lymphadenopathy. Reproductive: Status post hysterectomy and bilateral oophorectomy and pelvic lymphadenectomy. Other: No pelvic mass or free pelvic fluid collections. There is a small amount of air noted in the vaginal cuff on the left. No abscess. Musculoskeletal: No significant bony findings. No worrisome bone lesions. IMPRESSION: 1. Status post hysterectomy and bilateral oophorectomy and pelvic lymphadenectomy. 2. A few small scattered lymph nodes are noted in the anterior pararenal space on the right along with a few small lymph nodes near the  ascending colon. Recommend attention on future studies. No mesenteric or retroperitoneal mass. 3. No findings for metastatic disease involving the chest. Electronically Signed   By: Marijo Sanes M.D.   On: 04/16/2017 09:39    Labs:  CBC: Recent Labs    11/07/16 1551 03/20/17 0857 03/27/17 0509 04/19/17 1310 04/24/17 1025  WBC 5.7 5.1 7.4 6.9 6.4  HGB 14.4 14.4 13.0  --  13.8  HCT 43.0 43.5 39.4 41.7 41.5  PLT 309 244 248 309 292    COAGS: Recent Labs    04/24/17 1025  INR 0.92    BMP: Recent Labs    11/07/16 1551 03/20/17 0857 03/27/17 0509 04/19/17 1310  NA 140 138 138 138  K 4.1 3.9 3.9 4.1  CL 102 104 103 104  CO2 23 25 27 25   GLUCOSE 88 100* 129* 91  BUN 11 17 8 11   CALCIUM 9.0 8.8* 8.8* 9.6  CREATININE 0.68 0.84 0.85 0.86  GFRNONAA 97 >60 >60 >60  GFRAA 112 >60 >60 >60    LIVER FUNCTION TESTS: Recent Labs    11/07/16 1551 03/20/17 0857 04/19/17 1310  BILITOT 0.3 0.6 0.2  AST 24 26 20   ALT 19 21 17   ALKPHOS 79 69 85  PROT 6.7 7.2 7.1  ALBUMIN 4.4 4.1 3.8    TUMOR MARKERS: No results for input(s): AFPTM, CEA, CA199, CHROMGRNA in the last 8760 hours.  Assessment and Plan:  Endometrial cancer  Will proceed with placement of tunneled catheter with Port today by Dr. Laurence Ferrari.  Risks and benefits of image guided port-a-catheter placement was discussed with the patient including, but not limited to bleeding, infection, pneumothorax, or fibrin sheath development and need for additional procedures.  All of the patient's questions were answered, patient is agreeable to proceed. Consent signed and in chart.  Thank you for this interesting consult.  I greatly enjoyed meeting Rebekah [name] and look forward to participating in their care.  A copy of this report was sent to the requesting provider on this date.  Electronically Signed: Murrell Redden, PA-C 04/24/2017, 11:24 AM   I spent a total of  30 Minutes in face to face in clinical  consultation, greater than 50% of which was counseling/coordinating care for Rebekah [name]

## 2017-04-24 NOTE — Discharge Instructions (Addendum)
Implanted Port Insertion, Care After °This sheet gives you information about how to care for yourself after your procedure. Your health care provider may also give you more specific instructions. If you have problems or questions, contact your health care provider. °What can I expect after the procedure? °After your procedure, it is common to have: °· Discomfort at the port insertion site. °· Bruising on the skin over the port. This should improve over 3-4 days. ° °Follow these instructions at home: °Port care °· After your port is placed, you will get a manufacturer's information card. The card has information about your port. Keep this card with you at all times. °· Take care of the port as told by your health care provider. Ask your health care provider if you or a family member can get training for taking care of the port at home. A home health care nurse may also take care of the port. °· Make sure to remember what type of port you have. °Incision care °· Follow instructions from your health care provider about how to take care of your port insertion site. Make sure you: °? Wash your hands with soap and water before you change your bandage (dressing). If soap and water are not available, use hand sanitizer. °? Change your dressing as told by your health care provider. °? Leave stitches (sutures), skin glue, or adhesive strips in place. These skin closures may need to stay in place for 2 weeks or longer. If adhesive strip edges start to loosen and curl up, you may trim the loose edges. Do not remove adhesive strips completely unless your health care provider tells you to do that. °· Check your port insertion site every day for signs of infection. Check for: °? More redness, swelling, or pain. °? More fluid or blood. °? Warmth. °? Pus or a bad smell. °General instructions °· Do not take baths, swim, or use a hot tub until your health care provider approves. °· Do not lift anything that is heavier than 10 lb (4.5  kg) for a week, or as told by your health care provider. °· Ask your health care provider when it is okay to: °? Return to work or school. °? Resume usual physical activities or sports. °· Do not drive for 24 hours if you were given a medicine to help you relax (sedative). °· Take over-the-counter and prescription medicines only as told by your health care provider. °· Wear a medical alert bracelet in case of an emergency. This will tell any health care providers that you have a port. °· Keep all follow-up visits as told by your health care provider. This is important. °Contact a health care provider if: °· You cannot flush your port with saline as directed, or you cannot draw blood from the port. °· You have a fever or chills. °· You have more redness, swelling, or pain around your port insertion site. °· You have more fluid or blood coming from your port insertion site. °· Your port insertion site feels warm to the touch. °· You have pus or a bad smell coming from the port insertion site. °Get help right away if: °· You have chest pain or shortness of breath. °· You have bleeding from your port that you cannot control. °Summary °· Take care of the port as told by your health care provider. °· Change your dressing as told by your health care provider. °· Keep all follow-up visits as told by your health care provider. °  This information is not intended to replace advice given to you by your health care provider. Make sure you discuss any questions you have with your health care provider. °Document Released: 10/22/2012 Document Revised: 11/23/2015 Document Reviewed: 11/23/2015 °Elsevier Interactive Patient Education © 2017 [employer] °Moderate Conscious Sedation, Adult, Care After °These instructions provide you with information about caring for yourself after your procedure. Your health care provider may also give you more specific instructions. Your treatment has been planned according to current medical  practices, but problems sometimes occur. Call your health care provider if you have any problems or questions after your procedure. °What can I expect after the procedure? °After your procedure, it is common: °· To feel sleepy for several hours. °· To feel clumsy and have poor balance for several hours. °· To have poor judgment for several hours. °· To vomit if you eat too soon. ° °Follow these instructions at home: °For at least 24 hours after the procedure: ° °· Do not: °? Participate in activities where you could fall or become injured. °? Drive. °? Use heavy machinery. °? Drink alcohol. °? Take sleeping pills or medicines that cause drowsiness. °? Make important decisions or sign legal documents. °? Take care of children on your own. °· Rest. °Eating and drinking °· Follow the diet recommended by your health care provider. °· If you vomit: °? Drink water, juice, or soup when you can drink without vomiting. °? Make sure you have little or no nausea before eating solid foods. °General instructions °· Have a responsible adult stay with you until you are awake and alert. °· Take over-the-counter and prescription medicines only as told by your health care provider. °· If you smoke, do not smoke without supervision. °· Keep all follow-up visits as told by your health care provider. This is important. °Contact a health care provider if: °· You keep feeling nauseous or you keep vomiting. °· You feel light-headed. °· You develop a rash. °· You have a fever. °Get help right away if: °· You have trouble breathing. °This information is not intended to replace advice given to you by your health care provider. Make sure you discuss any questions you have with your health care provider. °Document Released: 10/22/2012 Document Revised: 06/06/2015 Document Reviewed: 04/23/2015 °Elsevier Interactive Patient Education © 2018 [employer] ° °

## 2017-04-24 NOTE — Procedures (Signed)
Interventional Radiology Procedure Note  Procedure: Placement of a right IJ approach single lumen PowerPort.  Tip is positioned at the superior cavoatrial junction and catheter is ready for immediate use.  Complications: No immediate Recommendations:  - Ok to shower tomorrow - Do not submerge for 7 days - Routine line care   Signed,  Heath K. McCullough, MD   

## 2017-04-25 ENCOUNTER — Inpatient Hospital Stay (HOSPITAL_BASED_OUTPATIENT_CLINIC_OR_DEPARTMENT_OTHER): Payer: BLUE CROSS/BLUE SHIELD | Admitting: Medical

## 2017-04-25 ENCOUNTER — Encounter: Payer: Self-pay | Admitting: Hematology and Oncology

## 2017-04-25 ENCOUNTER — Inpatient Hospital Stay: Payer: BLUE CROSS/BLUE SHIELD

## 2017-04-25 VITALS — BP 131/84 | HR 96 | Temp 98.4°F | Resp 16 | Wt 167.8 lb

## 2017-04-25 DIAGNOSIS — C541 Malignant neoplasm of endometrium: Secondary | ICD-10-CM

## 2017-04-25 DIAGNOSIS — Z5111 Encounter for antineoplastic chemotherapy: Secondary | ICD-10-CM | POA: Diagnosis not present

## 2017-04-25 DIAGNOSIS — F418 Other specified anxiety disorders: Secondary | ICD-10-CM | POA: Diagnosis not present

## 2017-04-25 DIAGNOSIS — Z79899 Other long term (current) drug therapy: Secondary | ICD-10-CM | POA: Diagnosis not present

## 2017-04-25 DIAGNOSIS — Z90722 Acquired absence of ovaries, bilateral: Secondary | ICD-10-CM | POA: Diagnosis not present

## 2017-04-25 DIAGNOSIS — Z800 Family history of malignant neoplasm of digestive organs: Secondary | ICD-10-CM | POA: Diagnosis not present

## 2017-04-25 DIAGNOSIS — Z803 Family history of malignant neoplasm of breast: Secondary | ICD-10-CM | POA: Diagnosis not present

## 2017-04-25 DIAGNOSIS — Z90710 Acquired absence of both cervix and uterus: Secondary | ICD-10-CM | POA: Diagnosis not present

## 2017-04-25 MED ORDER — FAMOTIDINE IN NACL 20-0.9 MG/50ML-% IV SOLN
20.0000 mg | Freq: Once | INTRAVENOUS | Status: AC
  Administered 2017-04-25: 20 mg via INTRAVENOUS

## 2017-04-25 MED ORDER — HEPARIN SOD (PORK) LOCK FLUSH 100 UNIT/ML IV SOLN
500.0000 [IU] | Freq: Once | INTRAVENOUS | Status: AC | PRN
  Administered 2017-04-25: 500 [IU]
  Filled 2017-04-25: qty 5

## 2017-04-25 MED ORDER — SODIUM CHLORIDE 0.9 % IV SOLN
Freq: Once | INTRAVENOUS | Status: AC
  Administered 2017-04-25: 11:00:00 via INTRAVENOUS

## 2017-04-25 MED ORDER — FAMOTIDINE IN NACL 20-0.9 MG/50ML-% IV SOLN
INTRAVENOUS | Status: AC
  Filled 2017-04-25: qty 50

## 2017-04-25 MED ORDER — PALONOSETRON HCL INJECTION 0.25 MG/5ML
0.2500 mg | Freq: Once | INTRAVENOUS | Status: AC
  Administered 2017-04-25: 0.25 mg via INTRAVENOUS

## 2017-04-25 MED ORDER — SODIUM CHLORIDE 0.9 % IV SOLN
175.0000 mg/m2 | Freq: Once | INTRAVENOUS | Status: AC
  Administered 2017-04-25: 312 mg via INTRAVENOUS
  Filled 2017-04-25: qty 52

## 2017-04-25 MED ORDER — DIPHENHYDRAMINE HCL 50 MG/ML IJ SOLN
INTRAMUSCULAR | Status: AC
Start: 2017-04-25 — End: 2017-04-25
  Filled 2017-04-25: qty 1

## 2017-04-25 MED ORDER — DIPHENHYDRAMINE HCL 50 MG/ML IJ SOLN
50.0000 mg | Freq: Once | INTRAMUSCULAR | Status: AC
  Administered 2017-04-25: 50 mg via INTRAVENOUS

## 2017-04-25 MED ORDER — FOSAPREPITANT DIMEGLUMINE INJECTION 150 MG
Freq: Once | INTRAVENOUS | Status: AC
  Administered 2017-04-25: 11:00:00 via INTRAVENOUS
  Filled 2017-04-25: qty 5

## 2017-04-25 MED ORDER — CARBOPLATIN CHEMO INJECTION 600 MG/60ML
680.0000 mg | Freq: Once | INTRAVENOUS | Status: AC
  Administered 2017-04-25: 680 mg via INTRAVENOUS
  Filled 2017-04-25: qty 68

## 2017-04-25 MED ORDER — PALONOSETRON HCL INJECTION 0.25 MG/5ML
INTRAVENOUS | Status: AC
  Filled 2017-04-25: qty 5

## 2017-04-25 MED ORDER — SODIUM CHLORIDE 0.9% FLUSH
10.0000 mL | INTRAVENOUS | Status: DC | PRN
  Administered 2017-04-25: 10 mL
  Filled 2017-04-25: qty 10

## 2017-04-25 NOTE — Progress Notes (Signed)
Per Dr. Alvy Bimler, it is ok to treat with elevated pulse rate of 103.

## 2017-04-25 NOTE — Patient Instructions (Addendum)
[facility] Discharge Instructions for Patients Receiving Chemotherapy  Today you received the following chemotherapy agents Taxol and Carboplatin.   To help prevent nausea and vomiting after your treatment, we encourage you to take your nausea medication as directed.   If you develop nausea and vomiting that is not controlled by your nausea medication, call the clinic.   BELOW ARE SYMPTOMS THAT SHOULD BE REPORTED IMMEDIATELY:  *FEVER GREATER THAN 100.5 F  *CHILLS WITH OR WITHOUT FEVER  NAUSEA AND VOMITING THAT IS NOT CONTROLLED WITH YOUR NAUSEA MEDICATION  *UNUSUAL SHORTNESS OF BREATH  *UNUSUAL BRUISING OR BLEEDING  TENDERNESS IN MOUTH AND THROAT WITH OR WITHOUT PRESENCE OF ULCERS  *URINARY PROBLEMS  *BOWEL PROBLEMS  UNUSUAL RASH Items with * indicate a potential emergency and should be followed up as soon as possible.  Feel free to call the clinic should you have any questions or concerns. The clinic phone number is [phone].  Please show the CHEMO ALERT CARD at check-in to the Emergency Department and triage nurse.   Paclitaxel injection What is this medicine? PACLITAXEL (PAK li TAX el) is a chemotherapy drug. It targets fast dividing cells, like cancer cells, and causes these cells to die. This medicine is used to treat ovarian cancer, breast cancer, and other cancers. This medicine may be used for other purposes; ask your health care provider or pharmacist if you have questions. COMMON BRAND NAME(S): Onxol, Taxol What should I tell my health care provider before I take this medicine? They need to know if you have any of these conditions: -blood disorders -irregular heartbeat -infection (especially a virus infection such as chickenpox, cold sores, or herpes) -liver disease -previous or ongoing radiation therapy -an unusual or allergic reaction to paclitaxel, alcohol, polyoxyethylated castor oil, other chemotherapy agents, other medicines, foods,  dyes, or preservatives -pregnant or trying to get pregnant -breast-feeding How should I use this medicine? This drug is given as an infusion into a vein. It is administered in a hospital or clinic by a specially trained health care professional. Talk to your pediatrician regarding the use of this medicine in children. Special care may be needed. Overdosage: If you think you have taken too much of this medicine contact a poison control center or emergency room at once. NOTE: This medicine is only for you. Do not share this medicine with others. What if I miss a dose? It is important not to miss your dose. Call your doctor or health care professional if you are unable to keep an appointment. What may interact with this medicine? Do not take this medicine with any of the following medications: -disulfiram -metronidazole This medicine may also interact with the following medications: -cyclosporine -diazepam -ketoconazole -medicines to increase blood counts like filgrastim, pegfilgrastim, sargramostim -other chemotherapy drugs like cisplatin, doxorubicin, epirubicin, etoposide, teniposide, vincristine -quinidine -testosterone -vaccines -verapamil Talk to your doctor or health care professional before taking any of these medicines: -acetaminophen -aspirin -ibuprofen -ketoprofen -naproxen This list may not describe all possible interactions. Give your health care provider a list of all the medicines, herbs, non-prescription drugs, or dietary supplements you use. Also tell them if you smoke, drink alcohol, or use illegal drugs. Some items may interact with your medicine. What should I watch for while using this medicine? Your condition will be monitored carefully while you are receiving this medicine. You will need important blood work done while you are taking this medicine. This medicine can cause serious allergic reactions. To reduce your risk you   will need to take other medicine(s)  before treatment with this medicine. If you experience allergic reactions like skin rash, itching or hives, swelling of the face, lips, or tongue, tell your doctor or health care professional right away. In some cases, you may be given additional medicines to help with side effects. Follow all directions for their use. This drug may make you feel generally unwell. This is not uncommon, as chemotherapy can affect healthy cells as well as cancer cells. Report any side effects. Continue your course of treatment even though you feel ill unless your doctor tells you to stop. Call your doctor or health care professional for advice if you get a fever, chills or sore throat, or other symptoms of a cold or flu. Do not treat yourself. This drug decreases your body's ability to fight infections. Try to avoid being around people who are sick. This medicine may increase your risk to bruise or bleed. Call your doctor or health care professional if you notice any unusual bleeding. Be careful brushing and flossing your teeth or using a toothpick because you may get an infection or bleed more easily. If you have any dental work done, tell your dentist you are receiving this medicine. Avoid taking products that contain aspirin, acetaminophen, ibuprofen, naproxen, or ketoprofen unless instructed by your doctor. These medicines may hide a fever. Do not become pregnant while taking this medicine. Women should inform their doctor if they wish to become pregnant or think they might be pregnant. There is a potential for serious side effects to an unborn child. Talk to your health care professional or pharmacist for more information. Do not breast-feed an infant while taking this medicine. Men are advised not to father a child while receiving this medicine. This product may contain alcohol. Ask your pharmacist or healthcare provider if this medicine contains alcohol. Be sure to tell all healthcare providers you are taking this  medicine. Certain medicines, like metronidazole and disulfiram, can cause an unpleasant reaction when taken with alcohol. The reaction includes flushing, headache, nausea, vomiting, sweating, and increased thirst. The reaction can last from 30 minutes to several hours. What side effects may I notice from receiving this medicine? Side effects that you should report to your doctor or health care professional as soon as possible: -allergic reactions like skin rash, itching or hives, swelling of the face, lips, or tongue -low blood counts - This drug may decrease the number of white blood cells, red blood cells and platelets. You may be at increased risk for infections and bleeding. -signs of infection - fever or chills, cough, sore throat, pain or difficulty passing urine -signs of decreased platelets or bleeding - bruising, pinpoint red spots on the skin, black, tarry stools, nosebleeds -signs of decreased red blood cells - unusually weak or tired, fainting spells, lightheadedness -breathing problems -chest pain -high or low blood pressure -mouth sores -nausea and vomiting -pain, swelling, redness or irritation at the injection site -pain, tingling, numbness in the hands or feet -slow or irregular heartbeat -swelling of the ankle, feet, hands Side effects that usually do not require medical attention (report to your doctor or health care professional if they continue or are bothersome): -bone pain -complete hair loss including hair on your head, underarms, pubic hair, eyebrows, and eyelashes -changes in the color of fingernails -diarrhea -loosening of the fingernails -loss of appetite -muscle or joint pain -red flush to skin -sweating This list may not describe all possible side effects. Call your doctor for   medical advice about side effects. You may report side effects to FDA at 1-800-FDA-1088. Where should I keep my medicine? This drug is given in a hospital or clinic and will not be  stored at home. NOTE: This sheet is a summary. It may not cover all possible information. If you have questions about this medicine, talk to your doctor, pharmacist, or health care provider.  2018 Elsevier/Gold Standard (2014-11-02 19:58:00)   Carboplatin injection What is this medicine? CARBOPLATIN (KAR boe pla tin) is a chemotherapy drug. It targets fast dividing cells, like cancer cells, and causes these cells to die. This medicine is used to treat ovarian cancer and many other cancers. This medicine may be used for other purposes; ask your health care provider or pharmacist if you have questions. COMMON BRAND NAME(S): Paraplatin What should I tell my health care provider before I take this medicine? They need to know if you have any of these conditions: -blood disorders -hearing problems -kidney disease -recent or ongoing radiation therapy -an unusual or allergic reaction to carboplatin, cisplatin, other chemotherapy, other medicines, foods, dyes, or preservatives -pregnant or trying to get pregnant -breast-feeding How should I use this medicine? This drug is usually given as an infusion into a vein. It is administered in a hospital or clinic by a specially trained health care professional. Talk to your pediatrician regarding the use of this medicine in children. Special care may be needed. Overdosage: If you think you have taken too much of this medicine contact a poison control center or emergency room at once. NOTE: This medicine is only for you. Do not share this medicine with others. What if I miss a dose? It is important not to miss a dose. Call your doctor or health care professional if you are unable to keep an appointment. What may interact with this medicine? -medicines for seizures -medicines to increase blood counts like filgrastim, pegfilgrastim, sargramostim -some antibiotics like amikacin, gentamicin, neomycin, streptomycin, tobramycin -vaccines Talk to your doctor  or health care professional before taking any of these medicines: -acetaminophen -aspirin -ibuprofen -ketoprofen -naproxen This list may not describe all possible interactions. Give your health care provider a list of all the medicines, herbs, non-prescription drugs, or dietary supplements you use. Also tell them if you smoke, drink alcohol, or use illegal drugs. Some items may interact with your medicine. What should I watch for while using this medicine? Your condition will be monitored carefully while you are receiving this medicine. You will need important blood work done while you are taking this medicine. This drug may make you feel generally unwell. This is not uncommon, as chemotherapy can affect healthy cells as well as cancer cells. Report any side effects. Continue your course of treatment even though you feel ill unless your doctor tells you to stop. In some cases, you may be given additional medicines to help with side effects. Follow all directions for their use. Call your doctor or health care professional for advice if you get a fever, chills or sore throat, or other symptoms of a cold or flu. Do not treat yourself. This drug decreases your body's ability to fight infections. Try to avoid being around people who are sick. This medicine may increase your risk to bruise or bleed. Call your doctor or health care professional if you notice any unusual bleeding. Be careful brushing and flossing your teeth or using a toothpick because you may get an infection or bleed more easily. If you have any dental work done,   tell your dentist you are receiving this medicine. Avoid taking products that contain aspirin, acetaminophen, ibuprofen, naproxen, or ketoprofen unless instructed by your doctor. These medicines may hide a fever. Do not become pregnant while taking this medicine. Women should inform their doctor if they wish to become pregnant or think they might be pregnant. There is a potential  for serious side effects to an unborn child. Talk to your health care professional or pharmacist for more information. Do not breast-feed an infant while taking this medicine. What side effects may I notice from receiving this medicine? Side effects that you should report to your doctor or health care professional as soon as possible: -allergic reactions like skin rash, itching or hives, swelling of the face, lips, or tongue -signs of infection - fever or chills, cough, sore throat, pain or difficulty passing urine -signs of decreased platelets or bleeding - bruising, pinpoint red spots on the skin, black, tarry stools, nosebleeds -signs of decreased red blood cells - unusually weak or tired, fainting spells, lightheadedness -breathing problems -changes in hearing -changes in vision -chest pain -high blood pressure -low blood counts - This drug may decrease the number of white blood cells, red blood cells and platelets. You may be at increased risk for infections and bleeding. -nausea and vomiting -pain, swelling, redness or irritation at the injection site -pain, tingling, numbness in the hands or feet -problems with balance, talking, walking -trouble passing urine or change in the amount of urine Side effects that usually do not require medical attention (report to your doctor or health care professional if they continue or are bothersome): -hair loss -loss of appetite -metallic taste in the mouth or changes in taste This list may not describe all possible side effects. Call your doctor for medical advice about side effects. You may report side effects to FDA at 1-800-FDA-1088. Where should I keep my medicine? This drug is given in a hospital or clinic and will not be stored at home. NOTE: This sheet is a summary. It may not cover all possible information. If you have questions about this medicine, talk to your doctor, pharmacist, or health care provider.  2018 Elsevier/Gold Standard  (2007-04-08 14:38:05)  

## 2017-04-25 NOTE — Progress Notes (Signed)
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  Unfortunately there aren't any foundations offering copay assistance for her Dx.  I offered the [facility] and [employer], went over what they cover and gave her an expense sheet.  She's not working so her daughter provided a letter of support so she was approved for the $400 [place] and $400 [employer] grants.  She has my card for any questions or concerns she may have in the future.

## 2017-04-26 ENCOUNTER — Telehealth: Payer: Self-pay

## 2017-04-26 NOTE — Progress Notes (Signed)
I was asked to see the patient in the infusion room as she was receiving her first cycle of carboplatin and paclitaxel.  She was receiving paclitaxel at 46 mL/h and reporting having a pinching sensation in her mid chest.  The patient had been premedicated with Aloxi, Benadryl 50 mg, Pepcid 20 mg, and Emend.  Her Taxol was paused with resolution of  with a pinching sensation in her mid chest.  Paclitaxel was restarted with no additional issues of concern noted.  The patient's vital signs remain stable.  Her blood pressure was 132/74 pulse was 97 and oxygen saturation was 97%.    Review of systems: Pinching sensation in the mid chest.  No fevers, chills, sweats, nausea, vomiting, chest tightness, shortness of breath, or diaphoresis.  Physical exam:  General: The patient is an adult female who appears to be in no acute distress but did appear to be mildly anxious.  Cardiovascular: Regular rate and rhythm without murmurs rubs or gallops.  Respiratory: Lungs are clear to auscultation without wheezes rales or rhonchi.  Sandi Mealy, MHS, PA-C

## 2017-04-26 NOTE — Telephone Encounter (Signed)
-----   Message from Tami Lin, RN sent at 04/25/2017  4:50 PM EDT ----- Regarding: Dr. Alvy Bimler- First time Taxol and Carboplatin First time Carboplatin and Taxol. Patient tolerated well.

## 2017-04-26 NOTE — Progress Notes (Signed)
LATE  NOTE:  [facility] Work  Clinical Social Work was referred by  Medical oncology for assessment of psychosocial needs.  Clinical Social Worker met with patient and patient's daughter in exam room to offer support and assess for needs.  Patient was tearful during visit and directed many of CSW assessment questions to daughter to answer.  Patient reported she cares for her grandchildren and CSW identified this as a source of joy for patient.  Patient's daughter identified new cancer diagnosis, loss of sense of autonomy(unable to drive due to seizure disorder), and recent separation from spouse as main factors contributing to anxiety and depression. Patient stated she is not interested in participating in groups or activities at cancer center, but was agreeable to individual counseling to create goals for managing anxiety/depression.  CSW attempted to meet with patient during first infusion, patient stated she was too tired.  CSW left voicemail to arrange counseling appointment on daughter's phone.    Maryjean Morn, MSW, LCSW, OSW-C Clinical Social Worker [facility] [phone]

## 2017-04-26 NOTE — Telephone Encounter (Signed)
Called to do follow up after first treatment yesterday. She is doing well today, eating and drinking with no problems. She said she was weak yesterday after the treatment but feels better today. She stumbled after she got home and fell and hit her ankle. She said her ankle is okay today and she is taking it easy today.   Instructed to call for questions or concerns.

## 2017-04-27 ENCOUNTER — Encounter: Payer: Self-pay | Admitting: Hematology and Oncology

## 2017-04-29 ENCOUNTER — Telehealth: Payer: Self-pay

## 2017-04-29 NOTE — Telephone Encounter (Signed)
Called regarding mychart message about abdominal pain. She is complaining of intermittent abdominal pain. She will have a sharp shooting pain at times.  She complained of a headache earlier and took tylenol for the headache. It helped the headache.  She had a bm today, using stool softeners. This is the first bm since chemotherapy.  Denies constipation. Complaining of nausea at times, she took compazine for nausea yesterday. Instructed to take Zofran now for nausea, and to alternate Compazine until symptoms are relieved per bottle instructions.  She feels weak. She has not ate anything today. She is able to drink. Instructed to push fluids after she takes the Zofran. Then to try to eat crackers, etc. Verbalized understanding.  Offered to get her a appt for IV fluids today. She is unable to come in today because she babysitting her grandchild. Offered appt for tomorrow. She refused because she has to babysit.  Instructed to call office tomorrow with update. Verbalized understanding.

## 2017-04-30 NOTE — Telephone Encounter (Signed)
Called back. She did not call to give update as instructed to yesterday. She is babysitting her grandchild today.  She is feeling better today. Complaining of a headache and joint pain. She has tried tylenol but she still has the headache and joint pain. Is there something else she could take for pain?  Eating and drinking better today. Still taking zofran and Compazine for nausea. Denies vomiting. Instructed to alternate zofran and compazine. Verbalized understanding.

## 2017-04-30 NOTE — Telephone Encounter (Signed)
caffeinated drinks help with headache She can also try to reduce zofran to half a dose I would avoid narcotics due to constipation

## 2017-04-30 NOTE — Telephone Encounter (Signed)
Called and given below message. Verbalized understanding. 

## 2017-05-01 ENCOUNTER — Encounter: Payer: Self-pay | Admitting: Hematology and Oncology

## 2017-05-02 ENCOUNTER — Telehealth: Payer: Self-pay

## 2017-05-02 NOTE — Telephone Encounter (Signed)
Called regarding mychart message.  Disability parking application ready for pick up. Will leave out front with [place] and patient is sending a family member to pick up.  Complaining of runny stools with a little red on the tissue with pain. Reported to Dr. Alvy Bimler. Per Dr. Alvy Bimler, she should do sitz bath a couple times a day, explained how to do sitz bath. Verbalized understanding.

## 2017-05-08 ENCOUNTER — Telehealth: Payer: Self-pay | Admitting: Oncology

## 2017-05-08 NOTE — Telephone Encounter (Signed)
Called patient's daughter, Lorenso Courier.  She said Donique is doing OK after chemo except that she is upset over starting to loose her hair.  She also said that Marybell is planning on starting radiation after her second round of chemotherapy on 05/16/17.  Advised her that Dr. Sondra Come will be notified and that we will be calling with her radiation appointments soon.

## 2017-05-12 ENCOUNTER — Encounter: Payer: Self-pay | Admitting: Hematology and Oncology

## 2017-05-13 ENCOUNTER — Telehealth: Payer: Self-pay | Admitting: Oncology

## 2017-05-13 NOTE — Telephone Encounter (Signed)
Patient reports having sharp pain where her right chest port a cath is located occasionally.  She describes it as feeling like "a knife slicing across my chest."  She said it happened about 5 times yesterday and lasts for a few minutes and then goes away.  She said she notices it more when laying down.  She denies having any redness at the sight and is not running a fever.  She is not having pain now and said she is not able to come in to see Symptom Management today because she is watching her grand baby.  She is wondering if she can apply Emla cream to the area when it is hurting.  Advised her that the EMLA may not help because it will take awhile to numb the area.  Also spoke to Rowe Robert, PA-C who also spoke to the patient's daughter.  He is advising a trial of Advil and if she does not get any relief, she can always be seen in IR.

## 2017-05-13 NOTE — Telephone Encounter (Signed)
I agree with the assessment to contact IR for evaluation if needed

## 2017-05-14 ENCOUNTER — Telehealth: Payer: Self-pay | Admitting: *Deleted

## 2017-05-14 NOTE — Telephone Encounter (Signed)
Voicemail received requesting return call from nurse [facility] please.  Message forwarded to collaborative for further patient communication from collaborative nurse.

## 2017-05-14 NOTE — Telephone Encounter (Signed)
Pt reports that she is still having the pain around her port. RN asked if they had talked to IR. States they had a call from IR, but daughter was in a meeting and could not answer the phone. Instructed to get the message from her daughter's phone and call the PA in IR back to discuss the pain she is having. Instructed to take advil in the meantime. Verbalized understanding. Will call us back if they cannot get in touch with the PA

## 2017-05-16 ENCOUNTER — Telehealth: Payer: Self-pay | Admitting: Hematology and Oncology

## 2017-05-16 ENCOUNTER — Inpatient Hospital Stay: Payer: BLUE CROSS/BLUE SHIELD | Attending: Gynecologic Oncology

## 2017-05-16 ENCOUNTER — Inpatient Hospital Stay (HOSPITAL_BASED_OUTPATIENT_CLINIC_OR_DEPARTMENT_OTHER): Payer: BLUE CROSS/BLUE SHIELD | Admitting: Hematology and Oncology

## 2017-05-16 ENCOUNTER — Inpatient Hospital Stay: Payer: BLUE CROSS/BLUE SHIELD

## 2017-05-16 ENCOUNTER — Encounter: Payer: Self-pay | Admitting: Hematology and Oncology

## 2017-05-16 DIAGNOSIS — G620 Drug-induced polyneuropathy: Secondary | ICD-10-CM | POA: Insufficient documentation

## 2017-05-16 DIAGNOSIS — R0789 Other chest pain: Secondary | ICD-10-CM | POA: Diagnosis not present

## 2017-05-16 DIAGNOSIS — Z5111 Encounter for antineoplastic chemotherapy: Secondary | ICD-10-CM | POA: Diagnosis not present

## 2017-05-16 DIAGNOSIS — C541 Malignant neoplasm of endometrium: Secondary | ICD-10-CM | POA: Diagnosis not present

## 2017-05-16 DIAGNOSIS — Z452 Encounter for adjustment and management of vascular access device: Secondary | ICD-10-CM | POA: Diagnosis not present

## 2017-05-16 DIAGNOSIS — R51 Headache: Secondary | ICD-10-CM | POA: Diagnosis not present

## 2017-05-16 DIAGNOSIS — Z79899 Other long term (current) drug therapy: Secondary | ICD-10-CM | POA: Diagnosis not present

## 2017-05-16 DIAGNOSIS — F418 Other specified anxiety disorders: Secondary | ICD-10-CM | POA: Insufficient documentation

## 2017-05-16 DIAGNOSIS — T451X5A Adverse effect of antineoplastic and immunosuppressive drugs, initial encounter: Secondary | ICD-10-CM

## 2017-05-16 LAB — CMP (CANCER CENTER ONLY)
ALBUMIN: 3.9 g/dL (ref 3.5–5.0)
ALT: 21 U/L (ref 0–55)
AST: 27 U/L (ref 5–34)
Alkaline Phosphatase: 88 U/L (ref 40–150)
Anion gap: 9 (ref 3–11)
BUN: 17 mg/dL (ref 7–26)
CHLORIDE: 106 mmol/L (ref 98–109)
CO2: 23 mmol/L (ref 22–29)
CREATININE: 0.76 mg/dL (ref 0.60–1.10)
Calcium: 9.5 mg/dL (ref 8.4–10.4)
GFR, Est AFR Am: >60 mL/min (ref >60)
GFR, Estimated: >60 mL/min (ref >60)
Glucose, Bld: 125 mg/dL (ref 70–140)
Potassium: 3.9 mmol/L (ref 3.5–5.1)
Sodium: 138 mmol/L (ref 136–145)
Total Bilirubin: 0.2 mg/dL (ref 0.2–1.2)
Total Protein: 7.5 g/dL (ref 6.4–8.3)

## 2017-05-16 LAB — CBC WITH DIFFERENTIAL (CANCER CENTER ONLY)
BASOS ABS: 0 10*3/uL (ref 0.0–0.1)
BASOS PCT: 0 %
EOS ABS: 0 10*3/uL (ref 0.0–0.5)
EOS PCT: 0 %
HCT: 39.7 % (ref 34.8–46.6)
Hemoglobin: 13.4 g/dL (ref 11.6–15.9)
LYMPHS PCT: 17 %
Lymphs Abs: 1 10*3/uL (ref 0.9–3.3)
MCH: 32.6 pg (ref 25.1–34.0)
MCHC: 33.8 g/dL (ref 31.5–36.0)
MCV: 96.4 fL (ref 79.5–101.0)
Monocytes Absolute: 0.1 10*3/uL (ref 0.1–0.9)
Monocytes Relative: 2 %
Neutro Abs: 4.7 10*3/uL (ref 1.5–6.5)
Neutrophils Relative %: 81 %
PLATELETS: 269 10*3/uL (ref 145–400)
RBC: 4.11 MIL/uL (ref 3.70–5.45)
RDW: 13.3 % (ref 11.2–14.5)
WBC: 5.7 10*3/uL (ref 3.9–10.3)

## 2017-05-16 MED ORDER — HEPARIN SOD (PORK) LOCK FLUSH 100 UNIT/ML IV SOLN
500.0000 [IU] | Freq: Once | INTRAVENOUS | Status: AC | PRN
  Administered 2017-05-16: 500 [IU]
  Filled 2017-05-16: qty 5

## 2017-05-16 MED ORDER — CARBOPLATIN CHEMO INJECTION 600 MG/60ML
680.0000 mg | Freq: Once | INTRAVENOUS | Status: AC
  Administered 2017-05-16: 680 mg via INTRAVENOUS
  Filled 2017-05-16: qty 68

## 2017-05-16 MED ORDER — FAMOTIDINE IN NACL 20-0.9 MG/50ML-% IV SOLN
INTRAVENOUS | Status: AC
  Filled 2017-05-16: qty 50

## 2017-05-16 MED ORDER — PALONOSETRON HCL INJECTION 0.25 MG/5ML
0.2500 mg | Freq: Once | INTRAVENOUS | Status: AC
  Administered 2017-05-16: 0.25 mg via INTRAVENOUS

## 2017-05-16 MED ORDER — SODIUM CHLORIDE 0.9 % IV SOLN
Freq: Once | INTRAVENOUS | Status: AC
  Administered 2017-05-16: 11:00:00 via INTRAVENOUS
  Filled 2017-05-16: qty 5

## 2017-05-16 MED ORDER — SODIUM CHLORIDE 0.9 % IV SOLN
Freq: Once | INTRAVENOUS | Status: AC
  Administered 2017-05-16: 11:00:00 via INTRAVENOUS

## 2017-05-16 MED ORDER — SODIUM CHLORIDE 0.9% FLUSH
10.0000 mL | INTRAVENOUS | Status: DC | PRN
  Administered 2017-05-16: 10 mL
  Filled 2017-05-16: qty 10

## 2017-05-16 MED ORDER — DIPHENHYDRAMINE HCL 50 MG/ML IJ SOLN
INTRAMUSCULAR | Status: AC
  Filled 2017-05-16: qty 1

## 2017-05-16 MED ORDER — SODIUM CHLORIDE 0.9 % IV SOLN
175.0000 mg/m2 | Freq: Once | INTRAVENOUS | Status: AC
  Administered 2017-05-16: 312 mg via INTRAVENOUS
  Filled 2017-05-16: qty 52

## 2017-05-16 MED ORDER — DIPHENHYDRAMINE HCL 50 MG/ML IJ SOLN
50.0000 mg | Freq: Once | INTRAMUSCULAR | Status: AC
  Administered 2017-05-16: 50 mg via INTRAVENOUS

## 2017-05-16 MED ORDER — FAMOTIDINE IN NACL 20-0.9 MG/50ML-% IV SOLN
20.0000 mg | Freq: Once | INTRAVENOUS | Status: AC
  Administered 2017-05-16: 20 mg via INTRAVENOUS

## 2017-05-16 MED ORDER — PALONOSETRON HCL INJECTION 0.25 MG/5ML
INTRAVENOUS | Status: AC
  Filled 2017-05-16: qty 5

## 2017-05-16 NOTE — Assessment & Plan Note (Signed)
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

## 2017-05-16 NOTE — Progress Notes (Signed)
[name] OFFICE PROGRESS NOTE  Patient Care Team: Wardell Honour, MD as PCP - General (Family Medicine)  ASSESSMENT & PLAN:  Endometrial cancer [facility]) She tolerated treatment well except for very mild grade 1 neuropathy affecting her fingers and discomfort along the port incision site Her blood work is satisfactory She appears to be coping better  We will proceed with cycle 2 of treatment without dose adjustment I will alert radiation oncologist to schedule adjuvant radiation treatment  Peripheral neuropathy due to chemotherapy [facility]) she has mild peripheral neuropathy, likely related to side effects of treatment. It is only mild, not bothering the patient. I will observe for now If it gets worse in the future, I will consider modifying the dose of the treatment   Pain, chest wall She has been complaining of intermittent chest wall discomfort I recommend conservative management She will be evaluated by interventional radiologist So far, it appears to be functioning well.   No orders of the defined types were placed in this encounter.   INTERVAL HISTORY: Please see below for problem oriented charting. She returns with her daughter for cycle 2 of treatment She tolerated treatment well apart from some mild peripheral neuropathy affecting her fingers that comes and goes and chest wall discomfort She denies significant nausea or changes in bowel habits No recent seizures  SUMMARY OF ONCOLOGIC HISTORY: Oncology History   Endometrioid with focal squamous  MSI stable     Endometrial cancer ([place])   02/20/2017 Pathology Results    Endometrium, biopsy - ENDOMETRIOID ADENOCARCINOMA, FIGO GRADE I, WITH SQUAMOUS DIFFERENTIATION. - SEE COMMENT      02/20/2017 Initial Diagnosis    Initially presented with PMB      03/26/2017 Pathology Results    1. Lymph node, sentinel, biopsy, right external iliac - LYMPH NODE, NEGATIVE FOR CARCINOMA (0/1). SEE NOTE 2. Lymph  node, sentinel, biopsy, left external iliac - FOCUS OF METASTATIC CARCINOMA TO ONE LYMPH NODE, 0.3 MM (1/1). SEE NOTE 3. Uterus +/- tubes/ovaries, neoplastic, cervix - ENDOMETRIOID ADENOCARCINOMA WITH FOCAL SQUAMOUS DIFFERENTIATION, 3.5 CM, FIGO GRADE I, INVOLVING ANTERIOR AND POSTERIOR ENDOMETRIUM - TUMOR INVADES MORE THAN 90% OF MYOMETRIAL THICKNESS - LYMPHOVASCULAR INVASION IS IDENTIFIED - TUMOR INTRALUMINALLY EXTENDS INTO THE RIGHT FALLOPIAN TUBE; LEFT FALLOPIAN TUBE AND BILATERAL OVARIES ARE UNREMARKABLE - SEE ONCOLOGY TABLE Microscopic Comment 3. ONCOLOGY TABLE-UTERUS, CARCINOMA OR CARCINOSARCOMA  Specimen: Uterus, tubes, ovaries and cervix Procedure: Total hysterectomy and bilateral salpingo-oophorectomy Lymph node sampling performed: Yes Specimen integrity: Intact Maximum tumor size: 3.5 cm Histologic type: Endometrioid adenocarcinoma with focal squamous differentiation Grade: I Myometrial invasion: 1.4 cm where myometrium is 1.5 cm in thickness Uterine Serosa Involvement: Not identified Cervical stromal involvement: Not identified Extent of involvement of other organs: Right fallopian tube Lymph - vascular invasion: Present Lymph nodes: Examined: 2 Sentinel 0 Non-sentinel 2 Total Lymph nodes with metastasis: 1 Isolated tumor cells (< 0.2 mm): 0 TNM code: pT1b, pN2m FIGO Stage (based on pathologic findings, needs clinical correlation): IIIC      03/26/2017 Surgery    Pre-operative Diagnosis: endometrial cancer grade 1  Operation: Robotic-assisted laparoscopic total hysterectomy with bilateral salpingoophorectomy, SLN biopsy   Surgeon: RDonaciano Eva Operative Findings:  : normal 7cm uterus, tubes and ovaries, normal appearing nodes, no apparent extrauterine disease        04/11/2017 Cancer Staging    Staging form: Corpus Uteri - Carcinoma and Carcinosarcoma, AJCC 8th Edition - Pathologic: FIGO Stage IIIC1 (pT3a, pN1, cM0) - Signed by  Heath Lark, MD on  04/11/2017      04/24/2017 Procedure    Successful placement of a right IJ approach Power Port with ultrasound and fluoroscopic guidance. The catheter is ready for use.      04/25/2017 -  Chemotherapy    The patient had carboplatin and Taxol for chemotherapy treatment.         REVIEW OF SYSTEMS:   Constitutional: Denies fevers, chills or abnormal weight loss Eyes: Denies blurriness of vision Ears, nose, mouth, throat, and face: Denies mucositis or sore throat Respiratory: Denies cough, dyspnea or wheezes Cardiovascular: Denies palpitation, chest discomfort or lower extremity swelling Gastrointestinal:  Denies nausea, heartburn or change in bowel habits Skin: Denies abnormal skin rashes Lymphatics: Denies new lymphadenopathy or easy bruising Neurological:Denies numbness, tingling or new weaknesses Behavioral/Psych: Mood is stable, no new changes  All other systems were reviewed with the patient and are negative.  I have reviewed the past medical history, past surgical history, social history and family history with the patient and they are unchanged from previous note.  ALLERGIES:  is allergic to [employer] tartrate] and soma [carisoprodol].  MEDICATIONS:  Current Outpatient Medications  Medication Sig Dispense Refill  . citalopram (CELEXA) 20 MG tablet Take 1 tablet (20 mg total) by mouth daily. 90 tablet 0  . dexamethasone (DECADRON) 4 MG tablet Take 5 tabs the night before and 5 tabs the morning of chemotherapy, every 3 weeks, take with food (Patient not taking: Reported on 04/19/2017) 60 tablet 0  . diphenhydramine-acetaminophen (TYLENOL PM) 25-500 MG TABS tablet Take 1 tablet by mouth at bedtime.    . DULoxetine (CYMBALTA) 60 MG capsule Take 2 capsules (120 mg total) by mouth daily. (Patient taking differently: Take 60 mg by mouth 2 (two) times daily. ) 180 capsule 0  . levETIRAcetam (KEPPRA) 1000 MG tablet Take 1,500 mg by mouth 2 (two) times daily.    Marland Kitchen  lidocaine-prilocaine (EMLA) cream Apply to affected area once (Patient not taking: Reported on 04/19/2017) 30 g 3  . LORazepam (ATIVAN) 0.5 MG tablet Take 1 tablet (0.5 mg total) by mouth every 8 (eight) hours as needed for anxiety. (Patient not taking: Reported on 04/19/2017) 60 tablet 0  . Multiple Vitamin (MULTIVITAMIN) capsule Take 1 capsule by mouth daily.    . ondansetron (ZOFRAN) 8 MG tablet Take 1 tablet (8 mg total) by mouth every 8 (eight) hours as needed for refractory nausea / vomiting. Start on day 3 after chemo. (Patient not taking: Reported on 04/19/2017) 30 tablet 1  . prochlorperazine (COMPAZINE) 10 MG tablet Take 1 tablet (10 mg total) by mouth every 6 (six) hours as needed (Nausea or vomiting). 30 tablet 1  . ranitidine (ZANTAC) 150 MG capsule Take 150 mg by mouth daily.     No current facility-administered medications for this visit.    Facility-Administered Medications Ordered in Other Visits  Medication Dose Route Frequency Provider Last Rate Last Dose  . CARBOplatin (PARAPLATIN) 680 mg in sodium chloride 0.9 % 250 mL chemo infusion  680 mg Intravenous Once Alvy Bimler, Kalinda Romaniello, MD      . fosaprepitant (EMEND) 150 mg, dexamethasone (DECADRON) 12 mg in sodium chloride 0.9 % 145 mL IVPB   Intravenous Once Heath Lark, MD 454 mL/hr at 05/16/17 1125    . heparin lock flush 100 unit/mL  500 Units Intracatheter Once PRN Alvy Bimler, Derwin Reddy, MD      . PACLitaxel (TAXOL) 312 mg in sodium chloride 0.9 % 500 mL chemo infusion (> 64m/m2)  175 mg/m2 (Treatment Plan Recorded) Intravenous Once Ajna Moors, MD      . sodium chloride flush (NS) 0.9 % injection 10 mL  10 mL Intracatheter PRN Alvy Bimler, Tyquon Near, MD        PHYSICAL EXAMINATION: ECOG PERFORMANCE STATUS: 1 - Symptomatic but completely ambulatory  Vitals:   05/16/17 0949  BP: (!) 125/94  Pulse: 91  Resp: 18  Temp: 98.4 F (36.9 C)  SpO2: 98%   Filed Weights   05/16/17 0949  Weight: 164 lb 3.2 oz (74.5 kg)    GENERAL:alert, no distress and  comfortable SKIN: skin color, texture, turgor are normal, no rashes or significant lesions EYES: normal, Conjunctiva are pink and non-injected, sclera clear OROPHARYNX:no exudate, no erythema and lips, buccal mucosa, and tongue normal  NECK: supple, thyroid normal size, non-tender, without nodularity LYMPH:  no palpable lymphadenopathy in the cervical, axillary or inguinal LUNGS: clear to auscultation and percussion with normal breathing effort HEART: regular rate & rhythm and no murmurs and no lower extremity edema ABDOMEN:abdomen soft, non-tender and normal bowel sounds Musculoskeletal:no cyanosis of digits and no clubbing  NEURO: alert & oriented x 3 with fluent speech, no focal motor/sensory deficits  LABORATORY DATA:  I have reviewed the data as listed    Component Value Date/Time   NA 138 05/16/2017 0903   NA 140 11/07/2016 1551   K 3.9 05/16/2017 0903   CL 106 05/16/2017 0903   CO2 23 05/16/2017 0903   GLUCOSE 125 05/16/2017 0903   BUN 17 05/16/2017 0903   BUN 11 11/07/2016 1551   CREATININE 0.76 05/16/2017 0903   CREATININE 0.79 12/02/2015 1146   CALCIUM 9.5 05/16/2017 0903   PROT 7.5 05/16/2017 0903   PROT 6.7 11/07/2016 1551   ALBUMIN 3.9 05/16/2017 0903   ALBUMIN 4.4 11/07/2016 1551   AST 27 05/16/2017 0903   ALT 21 05/16/2017 0903   ALKPHOS 88 05/16/2017 0903   BILITOT 0.2 05/16/2017 0903   GFRNONAA >60 05/16/2017 0903   GFRAA >60 05/16/2017 0903    No results found for: SPEP, UPEP  Lab Results  Component Value Date   WBC 5.7 05/16/2017   NEUTROABS 4.7 05/16/2017   HGB 13.4 05/16/2017   HCT 39.7 05/16/2017   MCV 96.4 05/16/2017   PLT 269 05/16/2017      Chemistry      Component Value Date/Time   NA 138 05/16/2017 0903   NA 140 11/07/2016 1551   K 3.9 05/16/2017 0903   CL 106 05/16/2017 0903   CO2 23 05/16/2017 0903   BUN 17 05/16/2017 0903   BUN 11 11/07/2016 1551   CREATININE 0.76 05/16/2017 0903   CREATININE 0.79 12/02/2015 1146       Component Value Date/Time   CALCIUM 9.5 05/16/2017 0903   ALKPHOS 88 05/16/2017 0903   AST 27 05/16/2017 0903   ALT 21 05/16/2017 0903   BILITOT 0.2 05/16/2017 0903       RADIOGRAPHIC STUDIES: I have personally reviewed the radiological images as listed and agreed with the findings in the report. Ir US Guide Vasc Access Right  Result Date: 04/24/2017 INDICATION: 62 year old female with endometrial cancer in need of durable venous access for chemotherapy. EXAM: IMPLANTED PORT A CATH PLACEMENT WITH ULTRASOUND AND FLUOROSCOPIC GUIDANCE MEDICATIONS: 2 g Ancef; The antibiotic was administered within an appropriate time interval prior to skin puncture. ANESTHESIA/SEDATION: Versed 2 mg IV; Fentanyl 100 mcg IV; Moderate Sedation Time:  19 minutes The patient was continuously monitored during the procedure  by the interventional radiology nurse under my direct supervision. FLUOROSCOPY TIME:  0 minutes, 6 seconds (1 mGy) COMPLICATIONS: None immediate. PROCEDURE: The right neck and chest was prepped with chlorhexidine, and draped in the usual sterile fashion using maximum barrier technique (cap and mask, sterile gown, sterile gloves, large sterile sheet, hand hygiene and cutaneous antiseptic). Antibiotic prophylaxis was provided with 2g Ancef administered IV one hour prior to skin incision. Local anesthesia was attained by infiltration with 1% lidocaine with epinephrine. Ultrasound demonstrated patency of the right internal jugular vein, and this was documented with an image. Under real-time ultrasound guidance, this vein was accessed with a 21 gauge micropuncture needle and image documentation was performed. A small dermatotomy was made at the access site with an 11 scalpel. A 0.018" wire was advanced into the SVC and the access needle exchanged for a 64F micropuncture vascular sheath. The 0.018" wire was then removed and a 0.035" wire advanced into the IVC. An appropriate location for the subcutaneous reservoir  was selected below the clavicle and an incision was made through the skin and underlying soft tissues. The subcutaneous tissues were then dissected using a combination of blunt and sharp surgical technique and a pocket was formed. A single lumen power injectable portacatheter was then tunneled through the subcutaneous tissues from the pocket to the dermatotomy and the port reservoir placed within the subcutaneous pocket. The venous access site was then serially dilated and a peel away vascular sheath placed over the wire. The wire was removed and the port catheter advanced into position under fluoroscopic guidance. The catheter tip is positioned in the cavoatrial junction. This was documented with a spot image. The portacatheter was then tested and found to flush and aspirate well. The port was flushed with saline followed by 100 units/mL heparinized saline. The pocket was then closed in two layers using first subdermal inverted interrupted absorbable sutures followed by a running subcuticular suture. The epidermis was then sealed with Dermabond. The dermatotomy at the venous access site was also sealed with Dermabond. IMPRESSION: Successful placement of a right IJ approach Power Port with ultrasound and fluoroscopic guidance. The catheter is ready for use. Electronically Signed   By: Jacqulynn Cadet M.D.   On: 04/24/2017 14:19   Ir Fluoro Guide Port Insertion Right  Result Date: 04/24/2017 INDICATION: 62 year old female with endometrial cancer in need of durable venous access for chemotherapy. EXAM: IMPLANTED PORT A CATH PLACEMENT WITH ULTRASOUND AND FLUOROSCOPIC GUIDANCE MEDICATIONS: 2 g Ancef; The antibiotic was administered within an appropriate time interval prior to skin puncture. ANESTHESIA/SEDATION: Versed 2 mg IV; Fentanyl 100 mcg IV; Moderate Sedation Time:  19 minutes The patient was continuously monitored during the procedure by the interventional radiology nurse under my direct supervision.  FLUOROSCOPY TIME:  0 minutes, 6 seconds (1 mGy) COMPLICATIONS: None immediate. PROCEDURE: The right neck and chest was prepped with chlorhexidine, and draped in the usual sterile fashion using maximum barrier technique (cap and mask, sterile gown, sterile gloves, large sterile sheet, hand hygiene and cutaneous antiseptic). Antibiotic prophylaxis was provided with 2g Ancef administered IV one hour prior to skin incision. Local anesthesia was attained by infiltration with 1% lidocaine with epinephrine. Ultrasound demonstrated patency of the right internal jugular vein, and this was documented with an image. Under real-time ultrasound guidance, this vein was accessed with a 21 gauge micropuncture needle and image documentation was performed. A small dermatotomy was made at the access site with an 11 scalpel. A 0.018" wire was advanced into the  SVC and the access needle exchanged for a 21F micropuncture vascular sheath. The 0.018" wire was then removed and a 0.035" wire advanced into the IVC. An appropriate location for the subcutaneous reservoir was selected below the clavicle and an incision was made through the skin and underlying soft tissues. The subcutaneous tissues were then dissected using a combination of blunt and sharp surgical technique and a pocket was formed. A single lumen power injectable portacatheter was then tunneled through the subcutaneous tissues from the pocket to the dermatotomy and the port reservoir placed within the subcutaneous pocket. The venous access site was then serially dilated and a peel away vascular sheath placed over the wire. The wire was removed and the port catheter advanced into position under fluoroscopic guidance. The catheter tip is positioned in the cavoatrial junction. This was documented with a spot image. The portacatheter was then tested and found to flush and aspirate well. The port was flushed with saline followed by 100 units/mL heparinized saline. The pocket was then  closed in two layers using first subdermal inverted interrupted absorbable sutures followed by a running subcuticular suture. The epidermis was then sealed with Dermabond. The dermatotomy at the venous access site was also sealed with Dermabond. IMPRESSION: Successful placement of a right IJ approach Power Port with ultrasound and fluoroscopic guidance. The catheter is ready for use. Electronically Signed   By: Jacqulynn Cadet M.D.   On: 04/24/2017 14:19    All questions were answered. The patient knows to call the clinic with any problems, questions or concerns. No barriers to learning was detected.  I spent 15 minutes counseling the patient face to face. The total time spent in the appointment was 20 minutes and more than 50% was on counseling and review of test results  Heath Lark, MD 05/16/2017 11:41 AM

## 2017-05-16 NOTE — Patient Instructions (Signed)
[facility] Discharge Instructions for Patients Receiving Chemotherapy  Today you received the following chemotherapy agents Taxol and Carboplatin.   To help prevent nausea and vomiting after your treatment, we encourage you to take your nausea medication as directed.   If you develop nausea and vomiting that is not controlled by your nausea medication, call the clinic.   BELOW ARE SYMPTOMS THAT SHOULD BE REPORTED IMMEDIATELY:  *FEVER GREATER THAN 100.5 F  *CHILLS WITH OR WITHOUT FEVER  NAUSEA AND VOMITING THAT IS NOT CONTROLLED WITH YOUR NAUSEA MEDICATION  *UNUSUAL SHORTNESS OF BREATH  *UNUSUAL BRUISING OR BLEEDING  TENDERNESS IN MOUTH AND THROAT WITH OR WITHOUT PRESENCE OF ULCERS  *URINARY PROBLEMS  *BOWEL PROBLEMS  UNUSUAL RASH Items with * indicate a potential emergency and should be followed up as soon as possible.  Feel free to call the clinic should you have any questions or concerns. The clinic phone number is [phone].  Please show the CHEMO ALERT CARD at check-in to the Emergency Department and triage nurse.   Paclitaxel injection What is this medicine? PACLITAXEL (PAK li TAX el) is a chemotherapy drug. It targets fast dividing cells, like cancer cells, and causes these cells to die. This medicine is used to treat ovarian cancer, breast cancer, and other cancers. This medicine may be used for other purposes; ask your health care provider or pharmacist if you have questions. COMMON BRAND NAME(S): Onxol, Taxol What should I tell my health care provider before I take this medicine? They need to know if you have any of these conditions: -blood disorders -irregular heartbeat -infection (especially a virus infection such as chickenpox, cold sores, or herpes) -liver disease -previous or ongoing radiation therapy -an unusual or allergic reaction to paclitaxel, alcohol, polyoxyethylated castor oil, other chemotherapy agents, other medicines, foods,  dyes, or preservatives -pregnant or trying to get pregnant -breast-feeding How should I use this medicine? This drug is given as an infusion into a vein. It is administered in a hospital or clinic by a specially trained health care professional. Talk to your pediatrician regarding the use of this medicine in children. Special care may be needed. Overdosage: If you think you have taken too much of this medicine contact a poison control center or emergency room at once. NOTE: This medicine is only for you. Do not share this medicine with others. What if I miss a dose? It is important not to miss your dose. Call your doctor or health care professional if you are unable to keep an appointment. What may interact with this medicine? Do not take this medicine with any of the following medications: -disulfiram -metronidazole This medicine may also interact with the following medications: -cyclosporine -diazepam -ketoconazole -medicines to increase blood counts like filgrastim, pegfilgrastim, sargramostim -other chemotherapy drugs like cisplatin, doxorubicin, epirubicin, etoposide, teniposide, vincristine -quinidine -testosterone -vaccines -verapamil Talk to your doctor or health care professional before taking any of these medicines: -acetaminophen -aspirin -ibuprofen -ketoprofen -naproxen This list may not describe all possible interactions. Give your health care provider a list of all the medicines, herbs, non-prescription drugs, or dietary supplements you use. Also tell them if you smoke, drink alcohol, or use illegal drugs. Some items may interact with your medicine. What should I watch for while using this medicine? Your condition will be monitored carefully while you are receiving this medicine. You will need important blood work done while you are taking this medicine. This medicine can cause serious allergic reactions. To reduce your risk you   will need to take other medicine(s)  before treatment with this medicine. If you experience allergic reactions like skin rash, itching or hives, swelling of the face, lips, or tongue, tell your doctor or health care professional right away. In some cases, you may be given additional medicines to help with side effects. Follow all directions for their use. This drug may make you feel generally unwell. This is not uncommon, as chemotherapy can affect healthy cells as well as cancer cells. Report any side effects. Continue your course of treatment even though you feel ill unless your doctor tells you to stop. Call your doctor or health care professional for advice if you get a fever, chills or sore throat, or other symptoms of a cold or flu. Do not treat yourself. This drug decreases your body's ability to fight infections. Try to avoid being around people who are sick. This medicine may increase your risk to bruise or bleed. Call your doctor or health care professional if you notice any unusual bleeding. Be careful brushing and flossing your teeth or using a toothpick because you may get an infection or bleed more easily. If you have any dental work done, tell your dentist you are receiving this medicine. Avoid taking products that contain aspirin, acetaminophen, ibuprofen, naproxen, or ketoprofen unless instructed by your doctor. These medicines may hide a fever. Do not become pregnant while taking this medicine. Women should inform their doctor if they wish to become pregnant or think they might be pregnant. There is a potential for serious side effects to an unborn child. Talk to your health care professional or pharmacist for more information. Do not breast-feed an infant while taking this medicine. Men are advised not to father a child while receiving this medicine. This product may contain alcohol. Ask your pharmacist or healthcare provider if this medicine contains alcohol. Be sure to tell all healthcare providers you are taking this  medicine. Certain medicines, like metronidazole and disulfiram, can cause an unpleasant reaction when taken with alcohol. The reaction includes flushing, headache, nausea, vomiting, sweating, and increased thirst. The reaction can last from 30 minutes to several hours. What side effects may I notice from receiving this medicine? Side effects that you should report to your doctor or health care professional as soon as possible: -allergic reactions like skin rash, itching or hives, swelling of the face, lips, or tongue -low blood counts - This drug may decrease the number of white blood cells, red blood cells and platelets. You may be at increased risk for infections and bleeding. -signs of infection - fever or chills, cough, sore throat, pain or difficulty passing urine -signs of decreased platelets or bleeding - bruising, pinpoint red spots on the skin, black, tarry stools, nosebleeds -signs of decreased red blood cells - unusually weak or tired, fainting spells, lightheadedness -breathing problems -chest pain -high or low blood pressure -mouth sores -nausea and vomiting -pain, swelling, redness or irritation at the injection site -pain, tingling, numbness in the hands or feet -slow or irregular heartbeat -swelling of the ankle, feet, hands Side effects that usually do not require medical attention (report to your doctor or health care professional if they continue or are bothersome): -bone pain -complete hair loss including hair on your head, underarms, pubic hair, eyebrows, and eyelashes -changes in the color of fingernails -diarrhea -loosening of the fingernails -loss of appetite -muscle or joint pain -red flush to skin -sweating This list may not describe all possible side effects. Call your doctor for   medical advice about side effects. You may report side effects to FDA at 1-800-FDA-1088. Where should I keep my medicine? This drug is given in a hospital or clinic and will not be  stored at home. NOTE: This sheet is a summary. It may not cover all possible information. If you have questions about this medicine, talk to your doctor, pharmacist, or health care provider.  2018 Elsevier/Gold Standard (2014-11-02 19:58:00)   Carboplatin injection What is this medicine? CARBOPLATIN (KAR boe pla tin) is a chemotherapy drug. It targets fast dividing cells, like cancer cells, and causes these cells to die. This medicine is used to treat ovarian cancer and many other cancers. This medicine may be used for other purposes; ask your health care provider or pharmacist if you have questions. COMMON BRAND NAME(S): Paraplatin What should I tell my health care provider before I take this medicine? They need to know if you have any of these conditions: -blood disorders -hearing problems -kidney disease -recent or ongoing radiation therapy -an unusual or allergic reaction to carboplatin, cisplatin, other chemotherapy, other medicines, foods, dyes, or preservatives -pregnant or trying to get pregnant -breast-feeding How should I use this medicine? This drug is usually given as an infusion into a vein. It is administered in a hospital or clinic by a specially trained health care professional. Talk to your pediatrician regarding the use of this medicine in children. Special care may be needed. Overdosage: If you think you have taken too much of this medicine contact a poison control center or emergency room at once. NOTE: This medicine is only for you. Do not share this medicine with others. What if I miss a dose? It is important not to miss a dose. Call your doctor or health care professional if you are unable to keep an appointment. What may interact with this medicine? -medicines for seizures -medicines to increase blood counts like filgrastim, pegfilgrastim, sargramostim -some antibiotics like amikacin, gentamicin, neomycin, streptomycin, tobramycin -vaccines Talk to your doctor  or health care professional before taking any of these medicines: -acetaminophen -aspirin -ibuprofen -ketoprofen -naproxen This list may not describe all possible interactions. Give your health care provider a list of all the medicines, herbs, non-prescription drugs, or dietary supplements you use. Also tell them if you smoke, drink alcohol, or use illegal drugs. Some items may interact with your medicine. What should I watch for while using this medicine? Your condition will be monitored carefully while you are receiving this medicine. You will need important blood work done while you are taking this medicine. This drug may make you feel generally unwell. This is not uncommon, as chemotherapy can affect healthy cells as well as cancer cells. Report any side effects. Continue your course of treatment even though you feel ill unless your doctor tells you to stop. In some cases, you may be given additional medicines to help with side effects. Follow all directions for their use. Call your doctor or health care professional for advice if you get a fever, chills or sore throat, or other symptoms of a cold or flu. Do not treat yourself. This drug decreases your body's ability to fight infections. Try to avoid being around people who are sick. This medicine may increase your risk to bruise or bleed. Call your doctor or health care professional if you notice any unusual bleeding. Be careful brushing and flossing your teeth or using a toothpick because you may get an infection or bleed more easily. If you have any dental work done,   tell your dentist you are receiving this medicine. Avoid taking products that contain aspirin, acetaminophen, ibuprofen, naproxen, or ketoprofen unless instructed by your doctor. These medicines may hide a fever. Do not become pregnant while taking this medicine. Women should inform their doctor if they wish to become pregnant or think they might be pregnant. There is a potential  for serious side effects to an unborn child. Talk to your health care professional or pharmacist for more information. Do not breast-feed an infant while taking this medicine. What side effects may I notice from receiving this medicine? Side effects that you should report to your doctor or health care professional as soon as possible: -allergic reactions like skin rash, itching or hives, swelling of the face, lips, or tongue -signs of infection - fever or chills, cough, sore throat, pain or difficulty passing urine -signs of decreased platelets or bleeding - bruising, pinpoint red spots on the skin, black, tarry stools, nosebleeds -signs of decreased red blood cells - unusually weak or tired, fainting spells, lightheadedness -breathing problems -changes in hearing -changes in vision -chest pain -high blood pressure -low blood counts - This drug may decrease the number of white blood cells, red blood cells and platelets. You may be at increased risk for infections and bleeding. -nausea and vomiting -pain, swelling, redness or irritation at the injection site -pain, tingling, numbness in the hands or feet -problems with balance, talking, walking -trouble passing urine or change in the amount of urine Side effects that usually do not require medical attention (report to your doctor or health care professional if they continue or are bothersome): -hair loss -loss of appetite -metallic taste in the mouth or changes in taste This list may not describe all possible side effects. Call your doctor for medical advice about side effects. You may report side effects to FDA at 1-800-FDA-1088. Where should I keep my medicine? This drug is given in a hospital or clinic and will not be stored at home. NOTE: This sheet is a summary. It may not cover all possible information. If you have questions about this medicine, talk to your doctor, pharmacist, or health care provider.  2018 Elsevier/Gold Standard  (2007-04-08 14:38:05)  

## 2017-05-16 NOTE — Assessment & Plan Note (Signed)
She has been complaining of intermittent chest wall discomfort I recommend conservative management She will be evaluated by interventional radiologist So far, it appears to be functioning well.

## 2017-05-16 NOTE — Assessment & Plan Note (Signed)
she has mild peripheral neuropathy, likely related to side effects of treatment. It is only mild, not bothering the patient. I will observe for now If it gets worse in the future, I will consider modifying the dose of the treatment  

## 2017-05-16 NOTE — Telephone Encounter (Signed)
Gave patient AVS and calendar of upcoming May and June appointments.

## 2017-05-16 NOTE — Patient Instructions (Signed)
Implanted Port Home Guide An implanted port is a type of central line that is placed under the skin. Central lines are used to provide IV access when treatment or nutrition needs to be given through a person's veins. Implanted ports are used for long-term IV access. An implanted port may be placed because:  You need IV medicine that would be irritating to the small veins in your hands or arms.  You need long-term IV medicines, such as antibiotics.  You need IV nutrition for a long period.  You need frequent blood draws for lab tests.  You need dialysis.  Implanted ports are usually placed in the chest area, but they can also be placed in the upper arm, the abdomen, or the leg. An implanted port has two main parts:  Reservoir. The reservoir is round and will appear as a small, raised area under your skin. The reservoir is the part where a needle is inserted to give medicines or draw blood.  Catheter. The catheter is a thin, flexible tube that extends from the reservoir. The catheter is placed into a large vein. Medicine that is inserted into the reservoir goes into the catheter and then into the vein.  How will I care for my incision site? Do not get the incision site wet. Bathe or shower as directed by your health care provider. How is my port accessed? Special steps must be taken to access the port:  Before the port is accessed, a numbing cream can be placed on the skin. This helps numb the skin over the port site.  Your health care provider uses a sterile technique to access the port. ? Your health care provider must put on a mask and sterile gloves. ? The skin over your port is cleaned carefully with an antiseptic and allowed to dry. ? The port is gently pinched between sterile gloves, and a needle is inserted into the port.  Only "non-coring" port needles should be used to access the port. Once the port is accessed, a blood return should be checked. This helps ensure that the port  is in the vein and is not clogged.  If your port needs to remain accessed for a constant infusion, a clear (transparent) bandage will be placed over the needle site. The bandage and needle will need to be changed every week, or as directed by your health care provider.  Keep the bandage covering the needle clean and dry. Do not get it wet. Follow your health care provider's instructions on how to take a shower or bath while the port is accessed.  If your port does not need to stay accessed, no bandage is needed over the port.  What is flushing? Flushing helps keep the port from getting clogged. Follow your health care provider's instructions on how and when to flush the port. Ports are usually flushed with saline solution or a medicine called heparin. The need for flushing will depend on how the port is used.  If the port is used for intermittent medicines or blood draws, the port will need to be flushed: ? After medicines have been given. ? After blood has been drawn. ? As part of routine maintenance.  If a constant infusion is running, the port may not need to be flushed.  How long will my port stay implanted? The port can stay in for as long as your health care provider thinks it is needed. When it is time for the port to come out, surgery will be   done to remove it. The procedure is similar to the one performed when the port was put in. When should I seek immediate medical care? When you have an implanted port, you should seek immediate medical care if:  You notice a bad smell coming from the incision site.  You have swelling, redness, or drainage at the incision site.  You have more swelling or pain at the port site or the surrounding area.  You have a fever that is not controlled with medicine.  This information is not intended to replace advice given to you by your health care provider. Make sure you discuss any questions you have with your health care provider. Document  Released: 01/01/2005 Document Revised: 06/09/2015 Document Reviewed: 09/08/2012 Elsevier Interactive Patient Education  2017 [employer]vier Inc.  

## 2017-05-20 ENCOUNTER — Encounter: Payer: Self-pay | Admitting: Hematology and Oncology

## 2017-05-21 ENCOUNTER — Telehealth: Payer: Self-pay | Admitting: *Deleted

## 2017-05-21 NOTE — Telephone Encounter (Signed)
Called patient to inform of [facility],, spoke with patient and she is aware of these appts.

## 2017-05-22 ENCOUNTER — Telehealth: Payer: Self-pay | Admitting: *Deleted

## 2017-05-22 NOTE — Telephone Encounter (Signed)
Called patient to remind of [facility], spoke with patient and she is aware of these appts.

## 2017-05-23 ENCOUNTER — Encounter: Payer: Self-pay | Admitting: Radiation Oncology

## 2017-05-23 ENCOUNTER — Other Ambulatory Visit: Payer: Self-pay

## 2017-05-23 ENCOUNTER — Ambulatory Visit
Admission: RE | Admit: 2017-05-23 | Discharge: 2017-05-23 | Disposition: A | Discharge status: Home / Self Care | Payer: BLUE CROSS/BLUE SHIELD | Source: Ambulatory Visit | Attending: Radiation Oncology | Admitting: Radiation Oncology

## 2017-05-23 ENCOUNTER — Encounter: Payer: Self-pay | Admitting: Hematology and Oncology

## 2017-05-23 VITALS — BP 124/92 | HR 102 | Temp 99.0°F | Resp 18 | Wt 162.5 lb

## 2017-05-23 VITALS — BP 122/92 | HR 99 | Temp 98.2°F | Resp 20

## 2017-05-23 DIAGNOSIS — C541 Malignant neoplasm of endometrium: Secondary | ICD-10-CM | POA: Insufficient documentation

## 2017-05-23 DIAGNOSIS — R197 Diarrhea, unspecified: Secondary | ICD-10-CM | POA: Diagnosis not present

## 2017-05-23 DIAGNOSIS — Z79899 Other long term (current) drug therapy: Secondary | ICD-10-CM | POA: Diagnosis not present

## 2017-05-23 DIAGNOSIS — C530 Malignant neoplasm of endocervix: Secondary | ICD-10-CM

## 2017-05-23 DIAGNOSIS — Z95828 Presence of other vascular implants and grafts: Secondary | ICD-10-CM

## 2017-05-23 DIAGNOSIS — R42 Dizziness and giddiness: Secondary | ICD-10-CM | POA: Insufficient documentation

## 2017-05-23 DIAGNOSIS — Z510 Encounter for antineoplastic radiation therapy: Secondary | ICD-10-CM | POA: Diagnosis not present

## 2017-05-23 DIAGNOSIS — Z4689 Encounter for fitting and adjustment of other specified devices: Secondary | ICD-10-CM | POA: Diagnosis not present

## 2017-05-23 LAB — BASIC METABOLIC PANEL - CANCER CENTER ONLY
Anion gap: 6 (ref 3–11)
BUN: 11 mg/dL (ref 7–26)
CALCIUM: 8.6 mg/dL (ref 8.4–10.4)
CO2: 28 mmol/L (ref 22–29)
CREATININE: 0.75 mg/dL (ref 0.60–1.10)
Chloride: 103 mmol/L (ref 98–109)
GFR, Est AFR Am: >60 mL/min (ref >60)
GLUCOSE: 102 mg/dL (ref 70–140)
POTASSIUM: 3.2 mmol/L — AB (ref 3.5–5.1)
SODIUM: 137 mmol/L (ref 136–145)

## 2017-05-23 LAB — CBC WITH DIFFERENTIAL (CANCER CENTER ONLY)
BASOS PCT: 0 %
Basophils Absolute: 0 10*3/uL (ref 0.0–0.1)
EOS ABS: 0 10*3/uL (ref 0.0–0.5)
Eosinophils Relative: 0 %
HCT: 35.3 % (ref 34.8–46.6)
Hemoglobin: 11.9 g/dL (ref 11.6–15.9)
LYMPHS PCT: 71 %
Lymphs Abs: 1.8 10*3/uL (ref 0.9–3.3)
MCH: 32.3 pg (ref 25.1–34.0)
MCHC: 33.7 g/dL (ref 31.5–36.0)
MCV: 95.9 fL (ref 79.5–101.0)
MONO ABS: 0.2 10*3/uL (ref 0.1–0.9)
Monocytes Relative: 8 %
Neutro Abs: 0.5 10*3/uL — ABNORMAL LOW (ref 1.5–6.5)
Neutrophils Relative %: 21 %
Platelet Count: 184 10*3/uL (ref 145–400)
RBC: 3.68 MIL/uL — AB (ref 3.70–5.45)
RDW: 12.6 % (ref 11.2–14.5)
WBC: 2.5 10*3/uL — AB (ref 3.9–10.3)

## 2017-05-23 MED ORDER — ONDANSETRON HCL 4 MG PO TABS
4.0000 mg | ORAL_TABLET | Freq: Once | ORAL | Status: AC
  Administered 2017-05-23: 4 mg via ORAL
  Filled 2017-05-23: qty 1

## 2017-05-23 MED ORDER — HEPARIN SOD (PORK) LOCK FLUSH 100 UNIT/ML IV SOLN
500.0000 [IU] | Freq: Once | INTRAVENOUS | Status: AC
  Administered 2017-05-23: 500 [IU] via INTRAVENOUS

## 2017-05-23 MED ORDER — SODIUM CHLORIDE 0.9 % IV SOLN
Freq: Once | INTRAVENOUS | Status: AC
  Administered 2017-05-23: 14:00:00 via INTRAVENOUS
  Filled 2017-05-23: qty 1000

## 2017-05-23 MED ORDER — ONDANSETRON HCL 4 MG PO TABS
ORAL_TABLET | ORAL | Status: AC
  Filled 2017-05-23: qty 1

## 2017-05-23 MED ORDER — SODIUM CHLORIDE 0.9% FLUSH
10.0000 mL | Freq: Once | INTRAVENOUS | Status: AC
  Administered 2017-05-23: 10 mL via INTRAVENOUS

## 2017-05-23 NOTE — Progress Notes (Signed)
  Radiation Oncology         [phone] ________________________________  Name: Rebekah [name] MRN: [id number]  Date: 05/23/2017  DOB: 10-01-1955  SIMULATION AND TREATMENT PLANNING NOTE HDR BRACHYTHERAPY  DIAGNOSIS: Stage IIIC1, grade 1 endometrial cancer  NARRATIVE:  The patient was brought to the [place] suite.  Identity was confirmed.  All relevant records and images related to the planned course of therapy were reviewed.  The patient freely provided informed written consent to proceed with treatment after reviewing the details related to the planned course of therapy. The consent form was witnessed and verified by the simulation staff.  Then, the patient was set-up in a stable reproducible  supine position for radiation therapy.  CT images were obtained.  Surface markings were placed.  The CT images were loaded into the planning software.  Then the target and avoidance structures were contoured.  Treatment planning then occurred.  The radiation prescription was entered and confirmed.   I have requested : Brachytherapy Isodose Plan and Dosimetry Calculations to plan the radiation distribution.    PLAN:  The patient will receive 30 Gy in 5 fractions. Iridium-192 will be the high-dose rate treatment source. The patient will be treated with a 2.5 cm diameter cylinder with a treatment length of 3.5 cm    ________________________________  Blair Promise, PhD, MD   This document serves as a record of services personally performed by Gery Pray, MD. It was created on his behalf by Bethann Humble, a trained medical scribe. The creation of this record is based on the scribe's personal observations and the provider's statements to them. This document has been checked and approved by the attending provider.

## 2017-05-23 NOTE — Progress Notes (Signed)
  Radiation Oncology         (336) [phone] ________________________________  Name: Rebekah [name] MRN: [id number]  Date: 05/23/2017  DOB: 1955/04/19  CC: Wardell Honour, MD  Everitt Amber, MD  HDR BRACHYTHERAPY NOTE  DIAGNOSIS: Stage IIIC1, grade 1 endometrial cancer   Simple treatment device note: Patient had construction of her custom vaginal cylinder. She will be treated with a 2.5 cm diameter segmented cylinder. This conforms to her anatomy without undue discomfort.  Vaginal brachytherapy procedure node: The patient was brought to the [place] suite. Identity was confirmed. All relevant records and images related to the planned course of therapy were reviewed. The patient freely provided informed written consent to proceed with treatment after reviewing the details related to the planned course of therapy. The consent form was witnessed and verified by the simulation staff. Then, the patient was set-up in a stable reproducible supine position for radiation therapy. Pelvic exam revealed the vaginal cuff to be intact. The patient's custom vaginal cylinder was placed in the proximal vagina. This was affixed to the CT/MR stabilization plate to prevent slippage. Patient tolerated the placement well.  Verification simulation note:  A fiducial marker was placed within the vaginal cylinder. An AP and lateral film was then obtained through the pelvis area. This documented accurate position of the vaginal cylinder for treatment.  HDR BRACHYTHERAPY TREATMENT  The remote afterloading device was affixed to the vaginal cylinder by catheter. Patient then proceeded to undergo her first high-dose-rate treatment directed at the proximal vagina. The patient was prescribed a dose of 6 gray to be delivered to the mucosal surface. Treatment length was 3.5 cm. Patient was treated with 1 channel using  8 dwell positions. Treatment time was 197.9 seconds. Iridium 192 was the high-dose-rate source for treatment. The  patient tolerated the treatment well. After completion of her therapy, a radiation survey was performed documenting return of the iridium source into the [employer] safe.   PLAN: The patient will return next week for her second high dose rate treatment. ________________________________  Blair Promise, PhD, MD   This document serves as a record of services personally performed by Gery Pray, MD. It was created on his behalf by Bethann Humble, a trained medical scribe. The creation of this record is based on the scribe's personal observations and the provider's statements to them. This document has been checked and approved by the attending provider.

## 2017-05-23 NOTE — Progress Notes (Signed)
Radiation Oncology         [phone] ________________________________  Name: Rebekah [name] MRN: [id number]  Date: 05/23/2017  DOB: [birth year]  Vaginal Brachytherapy Procedure Note  CC: Wardell Honour, MD Everitt Amber, MD    ICD-10-CM   1. Endometrial cancer ([place]) C54.1     Diagnosis: Stage IIIC1, grade 1 endometrial cancer  Narrative: She returns today for vaginal cylinder fitting. She reports loose, watery stools and frequent nausea. She also notes mild fatigue. She denies vaginal pain, bleeding, or discharge. By mouth intake is suboptimal since her chemotherapy last week. She reports some mild dizziness with standing  ALLERGIES: is allergic to [employer] tartrate] and soma [carisoprodol].  Meds: Current Outpatient Medications  Medication Sig Dispense Refill  . citalopram (CELEXA) 20 MG tablet Take 1 tablet (20 mg total) by mouth daily. 90 tablet 0  . dexamethasone (DECADRON) 4 MG tablet Take 5 tabs the night before and 5 tabs the morning of chemotherapy, every 3 weeks, take with food 60 tablet 0  . diphenhydramine-acetaminophen (TYLENOL PM) 25-500 MG TABS tablet Take 1 tablet by mouth at bedtime.    . DULoxetine (CYMBALTA) 60 MG capsule Take 2 capsules (120 mg total) by mouth daily. (Patient taking differently: Take 60 mg by mouth 2 (two) times daily. ) 180 capsule 0  . levETIRAcetam (KEPPRA) 1000 MG tablet Take 1,500 mg by mouth 2 (two) times daily.    Mar[employer] lidocaine-prilocaine (EMLA) cream Apply to affected area once 30 g 3  . LORazepam (ATIVAN) 0.5 MG tablet Take 1 tablet (0.5 mg total) by mouth every 8 (eight) hours as needed for anxiety. 60 tablet 0  . Multiple Vitamin (MULTIVITAMIN) capsule Take 1 capsule by mouth daily.    . ondansetron (ZOFRAN) 8 MG tablet Take 1 tablet (8 mg total) by mouth every 8 (eight) hours as needed for refractory nausea / vomiting. Start on day 3 after chemo. 30 tablet 1  . prochlorperazine (COMPAZINE) 10 MG tablet Take 1 tablet (10  mg total) by mouth every 6 (six) hours as needed (Nausea or vomiting). 30 tablet 1  . ranitidine (ZANTAC) 150 MG capsule Take 150 mg by mouth daily.     No current facility-administered medications for this encounter.     Physical Findings: The patient is in no acute distress. Patient is alert and oriented.  weight is 162 lb 8 oz (73.7 kg). Her oral temperature is 99 F (37.2 C). Her blood pressure is 124/92 (abnormal) and her pulse is 102 (abnormal). Her respiration is 18 and oxygen saturation is 99%.   No palpable cervical, supraclavicular or axillary lymphoadenopathy. The heart has a regular rate and rhythm. The lungs are clear to auscultation. Abdomen soft and non-tender.  On pelvic examination the external genitalia were unremarkable. A speculum exam was performed. Vaginal cuff intact, no mucosal lesions. On bimanual exam there were no pelvic masses appreciated.  Lab Findings: Lab Results  Component Value Date   WBC 5.7 05/16/2017   HGB 13.4 05/16/2017   HCT 39.7 05/16/2017   MCV 96.4 05/16/2017   PLT 269 05/16/2017    Radiographic Findings: Ir US Guide Vasc Access Right  Result Date: 04/24/2017 INDICATION: 62 year old female with endometrial cancer in need of durable venous access for chemotherapy. EXAM: IMPLANTED PORT A CATH PLACEMENT WITH ULTRASOUND AND FLUOROSCOPIC GUIDANCE MEDICATIONS: 2 g Ancef; The antibiotic was administered within an appropriate time interval prior to skin puncture. ANESTHESIA/SEDATION: Versed 2 mg IV; Fentanyl 100 mcg IV; Moderate Sedation Time:  19 minutes The patient was continuously monitored during the procedure by the interventional radiology nurse under my direct supervision. FLUOROSCOPY TIME:  0 minutes, 6 seconds (1 mGy) COMPLICATIONS: None immediate. PROCEDURE: The right neck and chest was prepped with chlorhexidine, and draped in the usual sterile fashion using maximum barrier technique (cap and mask, sterile gown, sterile gloves, large sterile  sheet, hand hygiene and cutaneous antiseptic). Antibiotic prophylaxis was provided with 2g Ancef administered IV one hour prior to skin incision. Local anesthesia was attained by infiltration with 1% lidocaine with epinephrine. Ultrasound demonstrated patency of the right internal jugular vein, and this was documented with an image. Under real-time ultrasound guidance, this vein was accessed with a 21 gauge micropuncture needle and image documentation was performed. A small dermatotomy was made at the access site with an 11 scalpel. A 0.018" wire was advanced into the SVC and the access needle exchanged for a 62F micropuncture vascular sheath. The 0.018" wire was then removed and a 0.035" wire advanced into the IVC. An appropriate location for the subcutaneous reservoir was selected below the clavicle and an incision was made through the skin and underlying soft tissues. The subcutaneous tissues were then dissected using a combination of blunt and sharp surgical technique and a pocket was formed. A single lumen power injectable portacatheter was then tunneled through the subcutaneous tissues from the pocket to the dermatotomy and the port reservoir placed within the subcutaneous pocket. The venous access site was then serially dilated and a peel away vascular sheath placed over the wire. The wire was removed and the port catheter advanced into position under fluoroscopic guidance. The catheter tip is positioned in the cavoatrial junction. This was documented with a spot image. The portacatheter was then tested and found to flush and aspirate well. The port was flushed with saline followed by 100 units/mL heparinized saline. The pocket was then closed in two layers using first subdermal inverted interrupted absorbable sutures followed by a running subcuticular suture. The epidermis was then sealed with Dermabond. The dermatotomy at the venous access site was also sealed with Dermabond. IMPRESSION: Successful placement  of a right IJ approach Power Port with ultrasound and fluoroscopic guidance. The catheter is ready for use. Electronically Signed   By: Jacqulynn Cadet M.D.   On: 04/24/2017 14:19   Ir Fluoro Guide Port Insertion Right  Result Date: 04/24/2017 INDICATION: 62 year old female with endometrial cancer in need of durable venous access for chemotherapy. EXAM: IMPLANTED PORT A CATH PLACEMENT WITH ULTRASOUND AND FLUOROSCOPIC GUIDANCE MEDICATIONS: 2 g Ancef; The antibiotic was administered within an appropriate time interval prior to skin puncture. ANESTHESIA/SEDATION: Versed 2 mg IV; Fentanyl 100 mcg IV; Moderate Sedation Time:  19 minutes The patient was continuously monitored during the procedure by the interventional radiology nurse under my direct supervision. FLUOROSCOPY TIME:  0 minutes, 6 seconds (1 mGy) COMPLICATIONS: None immediate. PROCEDURE: The right neck and chest was prepped with chlorhexidine, and draped in the usual sterile fashion using maximum barrier technique (cap and mask, sterile gown, sterile gloves, large sterile sheet, hand hygiene and cutaneous antiseptic). Antibiotic prophylaxis was provided with 2g Ancef administered IV one hour prior to skin incision. Local anesthesia was attained by infiltration with 1% lidocaine with epinephrine. Ultrasound demonstrated patency of the right internal jugular vein, and this was documented with an image. Under real-time ultrasound guidance, this vein was accessed with a 21 gauge micropuncture needle and image documentation was performed. A small dermatotomy was made at the access site with  an 11 scalpel. A 0.018" wire was advanced into the SVC and the access needle exchanged for a 70F micropuncture vascular sheath. The 0.018" wire was then removed and a 0.035" wire advanced into the IVC. An appropriate location for the subcutaneous reservoir was selected below the clavicle and an incision was made through the skin and underlying soft tissues. The  subcutaneous tissues were then dissected using a combination of blunt and sharp surgical technique and a pocket was formed. A single lumen power injectable portacatheter was then tunneled through the subcutaneous tissues from the pocket to the dermatotomy and the port reservoir placed within the subcutaneous pocket. The venous access site was then serially dilated and a peel away vascular sheath placed over the wire. The wire was removed and the port catheter advanced into position under fluoroscopic guidance. The catheter tip is positioned in the cavoatrial junction. This was documented with a spot image. The portacatheter was then tested and found to flush and aspirate well. The port was flushed with saline followed by 100 units/mL heparinized saline. The pocket was then closed in two layers using first subdermal inverted interrupted absorbable sutures followed by a running subcuticular suture. The epidermis was then sealed with Dermabond. The dermatotomy at the venous access site was also sealed with Dermabond. IMPRESSION: Successful placement of a right IJ approach Power Port with ultrasound and fluoroscopic guidance. The catheter is ready for use. Electronically Signed   By: Jacqulynn Cadet M.D.   On: 04/24/2017 14:19    Impression: Stage IIIC1, grade 1 endometrial cancer  Patient was fitted for a vaginal cylinder. The patient will be treated with a 2.5 cm diameter cylinder with a treatment length of 3.5 cm. This distended the vaginal vault without undue discomfort. The patient tolerated the procedure well.  The patient was successfully fitted for a vaginal cylinder. The patient is appropriate to begin vaginal brachytherapy.   Plan: The patient will proceed with CT simulation and vaginal brachytherapy today.  Patient has persistent problems with nausea and was given oral Zofran today which help with her nausea. She also be set up for IV fluids given her suboptimal by mouth intake over the past  several days The patient will receive her first high-dose-rate treatment later today.  _______________________________   Blair Promise, PhD, MD  This document serves as a record of services personally performed by Gery Pray, MD. It was created on his behalf by Bethann Humble, a trained medical scribe. The creation of this record is based on the scribe's personal observations and the provider's statements to them. This document has been checked and approved by the attending provider.

## 2017-05-23 NOTE — Progress Notes (Signed)
Patient denies any vaginal pain. States that she has mild fatigue. Patient denies any vaginal bleeding or discharge. States that she is not sure about rectal bleeding. States that she has really loose watery stools.States that she has a lot of nausea. States that she has medication for relief.  Vitals:   05/23/17 0812  BP: (!) 124/92  Pulse: (!) 102  Resp: 18  Temp: 99 F (37.2 C)  TempSrc: Oral  SpO2: 99%  Weight: 162 lb 8 oz (73.7 kg)   Wt Readings from Last 3 Encounters:  05/23/17 162 lb 8 oz (73.7 kg)  05/16/17 164 lb 3.2 oz (74.5 kg)  04/25/17 167 lb 12 oz (76.1 kg)

## 2017-05-28 ENCOUNTER — Inpatient Hospital Stay (HOSPITAL_BASED_OUTPATIENT_CLINIC_OR_DEPARTMENT_OTHER): Payer: BLUE CROSS/BLUE SHIELD | Admitting: Genetics

## 2017-05-28 ENCOUNTER — Encounter: Payer: Self-pay | Admitting: Genetics

## 2017-05-28 ENCOUNTER — Inpatient Hospital Stay: Payer: BLUE CROSS/BLUE SHIELD

## 2017-05-28 DIAGNOSIS — Z79899 Other long term (current) drug therapy: Secondary | ICD-10-CM | POA: Diagnosis not present

## 2017-05-28 DIAGNOSIS — G620 Drug-induced polyneuropathy: Secondary | ICD-10-CM | POA: Diagnosis not present

## 2017-05-28 DIAGNOSIS — C541 Malignant neoplasm of endometrium: Secondary | ICD-10-CM

## 2017-05-28 DIAGNOSIS — Z452 Encounter for adjustment and management of vascular access device: Secondary | ICD-10-CM | POA: Diagnosis not present

## 2017-05-28 DIAGNOSIS — R51 Headache: Secondary | ICD-10-CM | POA: Diagnosis not present

## 2017-05-28 DIAGNOSIS — R0789 Other chest pain: Secondary | ICD-10-CM | POA: Diagnosis not present

## 2017-05-28 DIAGNOSIS — F418 Other specified anxiety disorders: Secondary | ICD-10-CM | POA: Diagnosis not present

## 2017-05-28 DIAGNOSIS — Z5111 Encounter for antineoplastic chemotherapy: Secondary | ICD-10-CM | POA: Diagnosis not present

## 2017-05-28 DIAGNOSIS — Z803 Family history of malignant neoplasm of breast: Secondary | ICD-10-CM

## 2017-05-28 DIAGNOSIS — Z800 Family history of malignant neoplasm of digestive organs: Secondary | ICD-10-CM

## 2017-05-28 DIAGNOSIS — Z809 Family history of malignant neoplasm, unspecified: Secondary | ICD-10-CM

## 2017-05-28 DIAGNOSIS — C530 Malignant neoplasm of endocervix: Secondary | ICD-10-CM

## 2017-05-28 LAB — CMP (CANCER CENTER ONLY)
ALBUMIN: 3.8 g/dL (ref 3.5–5.0)
ALK PHOS: 85 U/L (ref 40–150)
ALT: 29 U/L (ref 0–55)
ANION GAP: 7 (ref 3–11)
AST: 22 U/L (ref 5–34)
BUN: 11 mg/dL (ref 7–26)
CALCIUM: 9.5 mg/dL (ref 8.4–10.4)
CO2: 27 mmol/L (ref 22–29)
Chloride: 105 mmol/L (ref 98–109)
Creatinine: 0.7 mg/dL (ref 0.60–1.10)
GFR, Est AFR Am: >60 mL/min (ref >60)
GFR, Estimated: >60 mL/min (ref >60)
GLUCOSE: 88 mg/dL (ref 70–140)
POTASSIUM: 4.4 mmol/L (ref 3.5–5.1)
SODIUM: 139 mmol/L (ref 136–145)
Total Bilirubin: <0.2 mg/dL — ABNORMAL LOW (ref 0.2–1.2)
Total Protein: 7 g/dL (ref 6.4–8.3)

## 2017-05-28 LAB — CBC WITH DIFFERENTIAL (CANCER CENTER ONLY)
BASOS PCT: 0 %
Basophils Absolute: 0 10*3/uL (ref 0.0–0.1)
EOS PCT: 0 %
Eosinophils Absolute: 0 10*3/uL (ref 0.0–0.5)
HCT: 35.6 % (ref 34.8–46.6)
HEMOGLOBIN: 12 g/dL (ref 11.6–15.9)
LYMPHS PCT: 66 %
Lymphs Abs: 1.7 10*3/uL (ref 0.9–3.3)
MCH: 32.8 pg (ref 25.1–34.0)
MCHC: 33.7 g/dL (ref 31.5–36.0)
MCV: 97.2 fL (ref 79.5–101.0)
Monocytes Absolute: 0.6 10*3/uL (ref 0.1–0.9)
Monocytes Relative: 23 %
Neutro Abs: 0.3 10*3/uL — CL (ref 1.5–6.5)
Neutrophils Relative %: 11 %
PLATELETS: 198 10*3/uL (ref 145–400)
RBC: 3.66 MIL/uL — ABNORMAL LOW (ref 3.70–5.45)
RDW: 13.3 % (ref 11.2–14.5)
WBC Count: 2.6 10*3/uL — ABNORMAL LOW (ref 3.9–10.3)

## 2017-05-28 MED ORDER — HEPARIN SOD (PORK) LOCK FLUSH 100 UNIT/ML IV SOLN
500.0000 [IU] | Freq: Once | INTRAVENOUS | Status: AC
  Administered 2017-05-28: 500 [IU]
  Filled 2017-05-28: qty 5

## 2017-05-28 MED ORDER — SODIUM CHLORIDE 0.9% FLUSH
10.0000 mL | Freq: Once | INTRAVENOUS | Status: AC
  Administered 2017-05-28: 10 mL
  Filled 2017-05-28: qty 10

## 2017-05-28 NOTE — Progress Notes (Signed)
REFERRING PROVIDER: Heath Lark, MD [place], [place] [address]  PRIMARY PROVIDER:  Wardell Honour, MD  PRIMARY REASON FOR VISIT:  1. Endometrial cancer ([place])   2. Family history of breast cancer   3. Family history of colon cancer   4. Family history of cancer   5. 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, a 62 y.o. female, was seen for a [facility] cancer genetics consultation at the request of Dr. Alvy Bimler due to a personal and family history of cancer.  [name] presents to clinic today to discuss the possibility of a hereditary predisposition to cancer, genetic testing, and to further clarify her future cancer risks, as well as potential cancer risks for family members.   In Feb 2019, at the age of 74, [name] was diagnosed with endometrial adenocarcinoma.  She had a total hysterectomy with BSO on 03/26/2017.  She is currently undergoing chemotherapy.   Tumor testing:  IHC was normal and tumor was MSI stable.    CANCER HISTORY:  Oncology History   Endometrioid with focal squamous  MSI stable     Endometrial cancer ([place])   02/20/2017 Pathology Results    Endometrium, biopsy - ENDOMETRIOID ADENOCARCINOMA, FIGO GRADE I, WITH SQUAMOUS DIFFERENTIATION. - SEE COMMENT      02/20/2017 Initial Diagnosis    Initially presented with PMB      03/26/2017 Pathology Results    1. Lymph node, sentinel, biopsy, right external iliac - LYMPH NODE, NEGATIVE FOR CARCINOMA (0/1). SEE NOTE 2. Lymph node, sentinel, biopsy, left external iliac - FOCUS OF METASTATIC CARCINOMA TO ONE LYMPH NODE, 0.3 MM (1/1). SEE NOTE 3. Uterus +/- tubes/ovaries, neoplastic, cervix - ENDOMETRIOID ADENOCARCINOMA WITH FOCAL SQUAMOUS DIFFERENTIATION, 3.5 CM, FIGO GRADE I, INVOLVING ANTERIOR AND POSTERIOR ENDOMETRIUM - TUMOR INVADES MORE THAN 90% OF MYOMETRIAL THICKNESS - LYMPHOVASCULAR INVASION IS IDENTIFIED - TUMOR  INTRALUMINALLY EXTENDS INTO THE RIGHT FALLOPIAN TUBE; LEFT FALLOPIAN TUBE AND BILATERAL OVARIES ARE UNREMARKABLE - SEE ONCOLOGY TABLE Microscopic Comment 3. ONCOLOGY TABLE-UTERUS, CARCINOMA OR CARCINOSARCOMA  Specimen: Uterus, tubes, ovaries and cervix Procedure: Total hysterectomy and bilateral salpingo-oophorectomy Lymph node sampling performed: Yes Specimen integrity: Intact Maximum tumor size: 3.5 cm Histologic type: Endometrioid adenocarcinoma with focal squamous differentiation Grade: I Myometrial invasion: 1.4 cm where myometrium is 1.5 cm in thickness Uterine Serosa Involvement: Not identified Cervical stromal involvement: Not identified Extent of involvement of other organs: Right fallopian tube Lymph - vascular invasion: Present Lymph nodes: Examined: 2 Sentinel 0 Non-sentinel 2 Total Lymph nodes with metastasis: 1 Isolated tumor cells (< 0.2 mm): 0 TNM code: pT1b, pN49m FIGO Stage (based on pathologic findings, needs clinical correlation): IIIC      03/26/2017 Surgery    Pre-operative Diagnosis: endometrial cancer grade 1  Operation: Robotic-assisted laparoscopic total hysterectomy with bilateral salpingoophorectomy, SLN biopsy   Surgeon: RDonaciano Eva Operative Findings:  : normal 7cm uterus, tubes and ovaries, normal appearing nodes, no apparent extrauterine disease        04/11/2017 Cancer Staging    Staging form: Corpus Uteri - Carcinoma and Carcinosarcoma, AJCC 8th Edition - Pathologic: FIGO Stage IIIC1 (pT3a, pN1, cM0) - Signed by GHeath Lark MD on 04/11/2017      04/24/2017 Procedure    Successful placement of a right IJ approach Power Port with ultrasound and fluoroscopic guidance. The catheter is ready for use.      04/25/2017 -  Chemotherapy    The patient  had carboplatin and Taxol for chemotherapy treatment.          HORMONAL RISK FACTORS:  Menarche was at age >5.  Ovaries intact: no.  Hysterectomy: yes.  Menopausal status:  postmenopausal.  Colonoscopy: yes; 2018- no polyps. Mammogram within the last year: yes.  Past Medical History:  Diagnosis Date  . Anxiety   . Arthritis   . Asthma    NO INHALERS USED  . Cancer ([place])    ENDOMETRIAL  . Depression   . Family history of breast cancer   . Family history of colon cancer   . Fibromyalgia   . GERD (gastroesophageal reflux disease)   . Insomnia   . Seizure disorder ([place])    LAST SEIZURE FEBRUARY 2019 SEES DR [facility]   . Seizures ([place])     Past Surgical History:  Procedure Laterality Date  . ABDOMINAL HYSTERECTOMY    . BREAST BIOPSY Right    biopsy in the 1990's  . BUNIONECTOMY Bilateral    2000's - R ONCE and L once  . CARPAL TUNNEL RELEASE Bilateral   . COLONSCOPY    . ENDOMETRIAL BIOPSY  02/2017  . IR FLUORO GUIDE PORT INSERTION RIGHT  04/24/2017  . IR US GUIDE VASC ACCESS RIGHT  04/24/2017  . ROBOTIC ASSISTED TOTAL HYSTERECTOMY WITH BILATERAL SALPINGO OOPHERECTOMY Bilateral 03/26/2017   Procedure: XI ROBOTIC ASSISTED TOTAL HYSTERECTOMY WITH BILATERAL SALPINGO OOPHORECTOMY AND SENTINAL LYMPH NODES;  Surgeon: Everitt Amber, MD;  Location: [facility];  Service: Gynecology;  Laterality: Bilateral;  . TUBAL LIGATION  1984  . TUBAL LIGATION      Social History   Socioeconomic History  . Marital status: Married    Spouse name: Not on file  . Number of children: 4  . Years of education: 8  . Highest education level: Not on file  Occupational History  . 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 Financial resource strain: Not on file  . Food insecurity:    Worry: Not on file    Inability: Not on file  . Transportation needs:    Medical: Not on file    Non-medical: Not on file  Tobacco Use  . Smoking status: Never Smoker  . Smokeless tobacco: Never Used  Substance and Sexual Activity  . Alcohol use: No    Alcohol/week: 0.0 oz  . Drug use: No  . Sexual activity: Not Currently    Partners: Male    Birth control/protection:  Post-menopausal  Lifestyle  . Physical activity:    Days per week: Not on file    Minutes per session: Not on file  . Stress: Not on file  Relationships  . Social connections:    Talks on phone: Not on file    Gets together: Not on file    Attends religious service: Not on file    Active member of club or organization: Not on file    Attends meetings of clubs or organizations: Not on file    Relationship status: Not on file  Other Topics Concern  . Not on file  Social History Narrative   Lives with daughter, [name]   Caffeine use: coffee/tea/soda daily     FAMILY HISTORY:  We obtained a detailed, 4-generation family history.  Significant diagnoses are listed below: Family History  Problem Relation Age of Onset  . Breast cancer Mother 75  . Diabetes Brother   . Hypertension Brother   . Stroke Brother   . Breast cancer Maternal  Grandmother        dx >50  . Breast cancer Maternal Aunt        dx <50  . Colon cancer Paternal Aunt   . Cancer Paternal Uncle        type unk  . Breast cancer Maternal Aunt        dx >50  . Cancer Paternal Aunt   . Cancer Paternal Uncle    [name] has 4 children. 1 died at 60 months due to SIDS, she has a 85 year old son who has 2 children, a 26 year-old daughter who has 2 children, and a 71 year-old daughter.   Ms. [place] father: died at 14, cause unk Paternal Aunts/Uncles: limited information about these relatives.  She reports she has 2 paternal aunts and 2 paternal uncles who all had cancer, 1 of them had colon cancer.  No further details known.  Paternal cousins: no known history or cancer, but limited information known about these relatives.  Paternal grandfather: unk Paternal grandmother:died at 71 with no history of cancer.   Ms. [place] mother: deceased, history of breast cancer dx at 12 Maternal Aunts/Uncles: 6 maternal aunts/uncles.  2 maternal aunts had breast cancer.  1 was diagnosed under  75, and the other was diagnosed older than 7. This aunt had a bilateral mastectomy 'to be safe'.  Maternal cousins: no known history of cancer.  Maternal grandfather: deceased >50, no history of cancer.  Maternal grandmother:deceased, history of breast cancer that was diagnosed over 80 years of age.   [name] is unaware of previous family history of genetic testing for hereditary cancer risks. Patient's maternal ancestors are of [place] descent, and paternal ancestors are of [place] descent. There is no reported Ashkenazi Jewish ancestry. There is no known consanguinity.  GENETIC COUNSELING ASSESSMENT: Delbra Zellars is a 62 y.o. female with a personal and family history which is somewhat suggestive of a Hereditary Cancer Predisposition Syndrome. We, therefore, discussed and recommended the following at today's visit.   DISCUSSION: We reviewed the characteristics, features and inheritance patterns of hereditary cancer syndromes. We also discussed genetic testing, including the appropriate family members to test, the process of testing, insurance coverage and turn-around-time for results. We discussed the implications of a negative, positive and/or variant of uncertain significant result. We recommended Ms. Valdez-Caldera pursue genetic testing for the Common Hereditary Cancers gene panel.   The Common Hereditary Cancer Panel offered by [facility] includes sequencing and/or deletion duplication testing of the following 47 genes: APC, ATM, AXIN2, BARD1, BMPR1A, BRCA1, BRCA2, BRIP1, CDH1, CDKN2A (p14ARF), CDKN2A (p16INK4a), CKD4, CHEK2, CTNNA1, DICER1, EPCAM (Deletion/duplication testing only), GREM1 (promoter region deletion/duplication testing only), KIT, MEN1, MLH1, MSH2, MSH3, MSH6, MUTYH, NBN, NF1, NHTL1, PALB2, PDGFRA, PMS2, POLD1, POLE, PTEN, RAD50, RAD51C, RAD51D, SDHB, SDHC, SDHD, SMAD4, SMARCA4. STK11, TP53, TSC1, TSC2, and VHL.  The following genes were evaluated for sequence  changes only: SDHA and HOXB13 c.251G>A variant only.  We discussed that only 5-10% of cancers are associated with a Hereditary cancer predisposition syndrome.  One of the most common hereditary cancer syndromes that increases breast cancer risk is called Hereditary Breast and Ovarian Cancer (HBOC) syndrome.  This syndrome is caused by mutations in the BRCA1 and BRCA2 genes.  This syndrome increases an individual's lifetime risk to develop breast, ovarian, pancreatic, and other types of cancer.  There are also many other cancer predisposition syndromes caused by mutations in several other genes.  We also discussed that Lynch Syndrome increases risk  for uterine, colon, ovarian, and other types of cancer.  Because her IHC was normal and her tumor is Microsatellite stable, this is unlikely, but still possible as these tumor tests are just a screening test.     We discussed that if she is found to have a mutation in one of these genes, it may impact future medical management recommendations such as increased cancer screenings and consideration of risk reducing surgeries.  A positive result could also have implications for the patient's family members.  A Negative result would mean we were unable to identify a hereditary component to her cancer, but does not rule out the possibility of a hereditary basis for her cancer.  There could be mutations that are undetectable by current technology, or in genes not yet tested or identified to increase cancer risk.    We discussed the potential to find a Variant of Uncertain Significance or VUS.  These are variants that have not yet been identified as pathogenic or benign, and it is unknown if this variant is associated with increased cancer risk or if this is a normal finding.  Most VUS's are reclassified to benign or likely benign.   It should not be used to make medical management decisions. With time, we suspect the lab will determine the significance of any VUS's  identified if any.   Based on Ms. Valdez-Caldera's personal and family history of cancer, she meets medical criteria for genetic testing. Despite that she meets criteria, she may still have an out of pocket cost. The laboratory will contact her regarding insurance estimate, OOP cost, self pay option.    Based on the patient's personal and family history, the statistical model (Tyrer Cusik)   Was used to estimate her risk of developing breast cancer. This estimates her lifetime risk of developing breast cancer to be approximately 7.4%. This estimation is in the setting of negative genetic test results, a positive result may impact this risk assessment.   The patient's lifetime breast cancer risk is a preliminary estimate based on available information using one of several models endorsed by the [place] (ACS). The ACS recommends consideration of breast MRI screening as an adjunct to mammography for patients at high risk (defined as 20% or greater lifetime risk). A more detailed breast cancer risk assessment can be considered, if clinically indicated.     PLAN: After considering the risks, benefits, and limitations, [name]  provided informed consent to pursue genetic testing and the blood sample was sent to [facility] for analysis of the Common Hereditary Cancers Panel. Results should be available within approximately 2-3 weeks' time, at which point they will be disclosed by telephone to Ms. Valdez-Caldera, as will any additional recommendations warranted by these results. [name] will receive a summary of her genetic counseling visit and a copy of her results once available. This information will also be available in Epic. We encouraged Ms. Valdez-Caldera to remain in contact with cancer genetics annually so that we can continuously update the family history and inform her of any changes in cancer genetics and testing that may be of benefit for her family. Ms.   [place] questions were answered to her satisfaction today. Our contact information was provided should additional questions or concerns arise.  Based on Ms. Valdez-Caldera's family history, we recommended her siblings and other maternal relatives, (especially those affected with cancer) also, have genetic counseling and testing. [name] will let us know if we can be of any assistance in coordinating  genetic counseling and/or testing for this family member.   Lastly, we encouraged Ms. Marschall to remain in contact with cancer genetics annually so that we can continuously update the family history and inform her of any changes in cancer genetics and testing that may be of benefit for this family.   Ms.  [place] questions were answered to her satisfaction today. Our contact information was provided should additional questions or concerns arise. Thank you for the referral and allowing [place] to share in the care of your patient.   Tana Felts, MS, [facility], [place] Certified Genetic Counselor [email or web address] phone: [phone]  The patient was seen for a total of 40 minutes in face-to-face genetic counseling.  The patient was accompanied today by her daughter, Nicole Kindred. This patient was discussed with Drs. Magrinat, Lindi Adie and/or Burr Medico who agrees with the above.

## 2017-05-29 ENCOUNTER — Telehealth: Payer: Self-pay | Admitting: *Deleted

## 2017-05-29 NOTE — Telephone Encounter (Signed)
Called patient to remind of HDR Tx. For 05-30-17 @ 1 pm, spoke with patient and she is aware of this tx.

## 2017-05-30 ENCOUNTER — Ambulatory Visit
Admission: RE | Admit: 2017-05-30 | Discharge: 2017-05-30 | Disposition: A | Discharge status: Home / Self Care | Payer: BLUE CROSS/BLUE SHIELD | Source: Ambulatory Visit | Attending: Radiation Oncology | Admitting: Radiation Oncology

## 2017-05-30 ENCOUNTER — Encounter: Payer: Self-pay | Admitting: Oncology

## 2017-05-30 DIAGNOSIS — C541 Malignant neoplasm of endometrium: Secondary | ICD-10-CM | POA: Diagnosis not present

## 2017-05-30 DIAGNOSIS — Z510 Encounter for antineoplastic radiation therapy: Secondary | ICD-10-CM | POA: Diagnosis not present

## 2017-05-30 DIAGNOSIS — C530 Malignant neoplasm of endocervix: Secondary | ICD-10-CM

## 2017-05-30 NOTE — Progress Notes (Signed)
  Radiation Oncology         [phone] ________________________________  Name: Rebekah [name] MRN: [id number]  Date: 05/30/2017  DOB: [birth year]  CC: Wardell Honour, MD  Everitt Amber, MD  HDR BRACHYTHERAPY NOTE  DIAGNOSIS:  Stage IIIC1, grade 1 endometrial cancer.   Simple treatment device note: Patient had construction of her custom vaginal cylinder. She will be treated with a 2.5 cm diameter segmented cylinder. This conforms to her anatomy without undue discomfort.  Vaginal brachytherapy procedure node: The patient was brought to the [place] suite. Identity was confirmed. All relevant records and images related to the planned course of therapy were reviewed. The patient freely provided informed written consent to proceed with treatment after reviewing the details related to the planned course of therapy. The consent form was witnessed and verified by the simulation staff. Then, the patient was set-up in a stable reproducible supine position for radiation therapy. Pelvic exam revealed the vaginal cuff to be intact . The patient's custom vaginal cylinder was placed in the proximal vagina. This was affixed to the CT/MR stabilization plate to prevent slippage. Patient tolerated the placement well.  Verification simulation note:  A fiducial marker was placed within the vaginal cylinder. An AP and lateral film was then obtained through the pelvis area. This documented accurate position of the vaginal cylinder for treatment.  HDR BRACHYTHERAPY TREATMENT  The remote afterloading device was affixed to the vaginal cylinder by catheter. Patient then proceeded to undergo her second high-dose-rate treatment directed at the proximal vagina. The patient was prescribed a dose of 6 gray to be delivered to the mucosal surface. Treatment length was 2.5 cm. Patient was treated with 1 channel using 8 dwell positions. Treatment time was 211.50 seconds. Iridium 192 was the high-dose-rate source for treatment.  The patient tolerated the treatment well. After completion of her therapy, a radiation survey was performed documenting return of the iridium source into the Gamma[employer] safe.   PLAN: The patient will return next week for her third high dose treatment. _____________________________  Blair Promise, PhD, MD  This document serves as a record of services personally performed by Gery Pray MD. It was created on his behalf by Delton Coombes, a trained medical scribe. The creation of this record is based on the scribe's personal observations and the provider's statements to them.

## 2017-06-04 ENCOUNTER — Telehealth: Payer: Self-pay | Admitting: *Deleted

## 2017-06-04 NOTE — Telephone Encounter (Signed)
Called patient to remind of [place]. For 06-05-17 @ 9 am, spoke with patient and she is aware of this tx.

## 2017-06-05 ENCOUNTER — Ambulatory Visit
Admission: RE | Admit: 2017-06-05 | Discharge: 2017-06-05 | Disposition: A | Discharge status: Home / Self Care | Payer: BLUE CROSS/BLUE SHIELD | Source: Ambulatory Visit | Attending: Radiation Oncology | Admitting: Radiation Oncology

## 2017-06-05 DIAGNOSIS — C541 Malignant neoplasm of endometrium: Secondary | ICD-10-CM

## 2017-06-05 DIAGNOSIS — G40219 Localization-related (focal) (partial) symptomatic epilepsy and epileptic syndromes with complex partial seizures, intractable, without status epilepticus: Secondary | ICD-10-CM | POA: Diagnosis not present

## 2017-06-05 DIAGNOSIS — Z510 Encounter for antineoplastic radiation therapy: Secondary | ICD-10-CM | POA: Diagnosis not present

## 2017-06-05 NOTE — Progress Notes (Signed)
  Radiation Oncology         [phone] ________________________________  Name: Rebekah [name] MRN: [id number]  Date: 06/05/2017  DOB: May 30, 1955  CC: Wardell Honour, MD  Everitt Amber, MD  HDR BRACHYTHERAPY NOTE  DIAGNOSIS: Stage IIIC1, grade 1 endometrial cancer.     Simple treatment device note: Patient had construction of her custom vaginal cylinder. She will be treated with a 2.5 cm diameter segmented cylinder. This conforms to her anatomy without undue discomfort.  Vaginal brachytherapy procedure node: The patient was brought to the [place] suite. Identity was confirmed. All relevant records and images related to the planned course of therapy were reviewed. The patient freely provided informed written consent to proceed with treatment after reviewing the details related to the planned course of therapy. The consent form was witnessed and verified by the simulation staff. Then, the patient was set-up in a stable reproducible supine position for radiation therapy. Pelvic exam revealed the vaginal cuff to be intact . The patient's custom vaginal cylinder was placed in the proximal vagina. This was affixed to the CT/MR stabilization plate to prevent slippage. Patient tolerated the placement well.  Verification simulation note:  A fiducial marker was placed within the vaginal cylinder. An AP and lateral film was then obtained through the pelvis area. This documented accurate position of the vaginal cylinder for treatment.  HDR BRACHYTHERAPY TREATMENT  The remote afterloading device was affixed to the vaginal cylinder by catheter. Patient then proceeded to undergo her third high-dose-rate treatment directed at the proximal vagina. The patient was prescribed a dose of 6.0 gray to be delivered to the mucosal surface. Treatment length was 3.5 cm. Patient was treated with 1 channel using 8 dwell positions. Treatment time was 223.5 seconds. Iridium 192 was the high-dose-rate source for  treatment. The patient tolerated the treatment well. After completion of her therapy, a radiation survey was performed documenting return of the iridium source into the GammaMed safe.   PLAN: the patient will return next week for her fourth high-dose rate treatment. ________________________________  Blair Promise, PhD, MD

## 2017-06-06 ENCOUNTER — Encounter: Payer: Self-pay | Admitting: Genetics

## 2017-06-06 ENCOUNTER — Ambulatory Visit: Payer: BLUE CROSS/BLUE SHIELD | Admitting: Radiation Oncology

## 2017-06-06 ENCOUNTER — Inpatient Hospital Stay: Payer: BLUE CROSS/BLUE SHIELD | Admitting: Medical

## 2017-06-06 ENCOUNTER — Inpatient Hospital Stay: Payer: BLUE CROSS/BLUE SHIELD

## 2017-06-06 ENCOUNTER — Inpatient Hospital Stay: Payer: BLUE CROSS/BLUE SHIELD | Admitting: Hematology and Oncology

## 2017-06-06 ENCOUNTER — Other Ambulatory Visit: Payer: Self-pay | Admitting: Hematology and Oncology

## 2017-06-06 ENCOUNTER — Encounter: Payer: Self-pay | Admitting: Hematology and Oncology

## 2017-06-06 ENCOUNTER — Ambulatory Visit: Payer: Self-pay | Admitting: Genetics

## 2017-06-06 ENCOUNTER — Telehealth: Payer: Self-pay | Admitting: Genetics

## 2017-06-06 VITALS — BP 124/81 | HR 93 | Resp 16

## 2017-06-06 DIAGNOSIS — Z800 Family history of malignant neoplasm of digestive organs: Secondary | ICD-10-CM

## 2017-06-06 DIAGNOSIS — T451X5A Adverse effect of antineoplastic and immunosuppressive drugs, initial encounter: Secondary | ICD-10-CM

## 2017-06-06 DIAGNOSIS — Z1379 Encounter for other screening for genetic and chromosomal anomalies: Secondary | ICD-10-CM | POA: Insufficient documentation

## 2017-06-06 DIAGNOSIS — Z79899 Other long term (current) drug therapy: Secondary | ICD-10-CM

## 2017-06-06 DIAGNOSIS — C541 Malignant neoplasm of endometrium: Secondary | ICD-10-CM | POA: Diagnosis not present

## 2017-06-06 DIAGNOSIS — Z809 Family history of malignant neoplasm, unspecified: Secondary | ICD-10-CM

## 2017-06-06 DIAGNOSIS — Z452 Encounter for adjustment and management of vascular access device: Secondary | ICD-10-CM | POA: Diagnosis not present

## 2017-06-06 DIAGNOSIS — R51 Headache: Secondary | ICD-10-CM | POA: Diagnosis not present

## 2017-06-06 DIAGNOSIS — F418 Other specified anxiety disorders: Secondary | ICD-10-CM | POA: Diagnosis not present

## 2017-06-06 DIAGNOSIS — G620 Drug-induced polyneuropathy: Secondary | ICD-10-CM

## 2017-06-06 DIAGNOSIS — Z803 Family history of malignant neoplasm of breast: Secondary | ICD-10-CM

## 2017-06-06 DIAGNOSIS — R0789 Other chest pain: Secondary | ICD-10-CM | POA: Diagnosis not present

## 2017-06-06 DIAGNOSIS — Z5111 Encounter for antineoplastic chemotherapy: Secondary | ICD-10-CM | POA: Diagnosis not present

## 2017-06-06 DIAGNOSIS — T8090XA Unspecified complication following infusion and therapeutic injection, initial encounter: Secondary | ICD-10-CM

## 2017-06-06 LAB — CBC WITH DIFFERENTIAL (CANCER CENTER ONLY)
BASOS ABS: 0 10*3/uL (ref 0.0–0.1)
BASOS PCT: 0 %
EOS ABS: 0 10*3/uL (ref 0.0–0.5)
EOS PCT: 0 %
HCT: 37.2 % (ref 34.8–46.6)
Hemoglobin: 12.7 g/dL (ref 11.6–15.9)
Lymphocytes Relative: 21 %
Lymphs Abs: 1.1 10*3/uL (ref 0.9–3.3)
MCH: 33.3 pg (ref 25.1–34.0)
MCHC: 34.1 g/dL (ref 31.5–36.0)
MCV: 97.6 fL (ref 79.5–101.0)
MONO ABS: 0.1 10*3/uL (ref 0.1–0.9)
MONOS PCT: 3 %
Neutro Abs: 4 10*3/uL (ref 1.5–6.5)
Neutrophils Relative %: 76 %
PLATELETS: 192 10*3/uL (ref 145–400)
RBC: 3.81 MIL/uL (ref 3.70–5.45)
RDW: 14.3 % (ref 11.2–14.5)
WBC Count: 5.2 10*3/uL (ref 3.9–10.3)

## 2017-06-06 LAB — CMP (CANCER CENTER ONLY)
ALBUMIN: 3.9 g/dL (ref 3.5–5.0)
ALK PHOS: 82 U/L (ref 40–150)
ALT: 21 U/L (ref 0–55)
AST: 19 U/L (ref 5–34)
Anion gap: 9 (ref 3–11)
BILIRUBIN TOTAL: 0.3 mg/dL (ref 0.2–1.2)
BUN: 12 mg/dL (ref 7–26)
CALCIUM: 9.5 mg/dL (ref 8.4–10.4)
CO2: 24 mmol/L (ref 22–29)
CREATININE: 0.71 mg/dL (ref 0.60–1.10)
Chloride: 104 mmol/L (ref 98–109)
GFR, Est AFR Am: >60 mL/min (ref >60)
GFR, Estimated: >60 mL/min (ref >60)
GLUCOSE: 144 mg/dL — AB (ref 70–140)
Potassium: 3.7 mmol/L (ref 3.5–5.1)
SODIUM: 137 mmol/L (ref 136–145)
TOTAL PROTEIN: 7.3 g/dL (ref 6.4–8.3)

## 2017-06-06 MED ORDER — SODIUM CHLORIDE 0.9% FLUSH
10.0000 mL | Freq: Once | INTRAVENOUS | Status: AC
  Administered 2017-06-06: 10 mL
  Filled 2017-06-06: qty 10

## 2017-06-06 MED ORDER — SODIUM CHLORIDE 0.9 % IV SOLN
694.8000 mg | Freq: Once | INTRAVENOUS | Status: AC
  Administered 2017-06-06: 690 mg via INTRAVENOUS
  Filled 2017-06-06: qty 69

## 2017-06-06 MED ORDER — LORAZEPAM 2 MG/ML IJ SOLN
INTRAMUSCULAR | Status: AC
  Filled 2017-06-06: qty 1

## 2017-06-06 MED ORDER — FAMOTIDINE IN NACL 20-0.9 MG/50ML-% IV SOLN
20.0000 mg | Freq: Once | INTRAVENOUS | Status: AC
  Administered 2017-06-06: 20 mg via INTRAVENOUS

## 2017-06-06 MED ORDER — MORPHINE SULFATE 4 MG/ML IJ SOLN
1.0000 mg | Freq: Once | INTRAMUSCULAR | Status: AC
Start: 2017-06-06 — End: 2017-06-06
  Administered 2017-06-06: 1 mg via INTRAVENOUS
  Filled 2017-06-06: qty 1

## 2017-06-06 MED ORDER — FOSAPREPITANT DIMEGLUMINE INJECTION 150 MG
Freq: Once | INTRAVENOUS | Status: AC
  Administered 2017-06-06: 12:00:00 via INTRAVENOUS
  Filled 2017-06-06: qty 5

## 2017-06-06 MED ORDER — ACETAMINOPHEN 325 MG PO TABS
650.0000 mg | ORAL_TABLET | Freq: Four times a day (QID) | ORAL | Status: DC | PRN
  Administered 2017-06-06: 650 mg via ORAL

## 2017-06-06 MED ORDER — DIPHENHYDRAMINE HCL 50 MG/ML IJ SOLN
50.0000 mg | Freq: Once | INTRAMUSCULAR | Status: AC
  Administered 2017-06-06: 50 mg via INTRAVENOUS

## 2017-06-06 MED ORDER — MORPHINE SULFATE (PF) 4 MG/ML IV SOLN
INTRAVENOUS | Status: AC
Start: 2017-06-06 — End: ?
  Filled 2017-06-06: qty 1

## 2017-06-06 MED ORDER — SODIUM CHLORIDE 0.9% FLUSH
10.0000 mL | INTRAVENOUS | Status: DC | PRN
  Administered 2017-06-06: 10 mL
  Filled 2017-06-06: qty 10

## 2017-06-06 MED ORDER — PALONOSETRON HCL INJECTION 0.25 MG/5ML
INTRAVENOUS | Status: AC
  Filled 2017-06-06: qty 5

## 2017-06-06 MED ORDER — LORAZEPAM 2 MG/ML IJ SOLN
0.5000 mg | Freq: Once | INTRAMUSCULAR | Status: AC
  Administered 2017-06-06: 0.5 mg via INTRAVENOUS

## 2017-06-06 MED ORDER — PALONOSETRON HCL INJECTION 0.25 MG/5ML
0.2500 mg | Freq: Once | INTRAVENOUS | Status: AC
  Administered 2017-06-06: 0.25 mg via INTRAVENOUS

## 2017-06-06 MED ORDER — PACLITAXEL CHEMO INJECTION 300 MG/50ML
131.2500 mg/m2 | Freq: Once | INTRAVENOUS | Status: AC
  Administered 2017-06-06: 234 mg via INTRAVENOUS
  Filled 2017-06-06: qty 39

## 2017-06-06 MED ORDER — HEPARIN SOD (PORK) LOCK FLUSH 100 UNIT/ML IV SOLN
500.0000 [IU] | Freq: Once | INTRAVENOUS | Status: AC | PRN
  Administered 2017-06-06: 500 [IU]
  Filled 2017-06-06: qty 5

## 2017-06-06 MED ORDER — ACETAMINOPHEN 325 MG PO TABS
ORAL_TABLET | ORAL | Status: AC
Start: 2017-06-06 — End: ?
  Filled 2017-06-06: qty 2

## 2017-06-06 MED ORDER — FAMOTIDINE IN NACL 20-0.9 MG/50ML-% IV SOLN
INTRAVENOUS | Status: AC
  Filled 2017-06-06: qty 50

## 2017-06-06 MED ORDER — SODIUM CHLORIDE 0.9 % IV SOLN
Freq: Once | INTRAVENOUS | Status: AC
  Administered 2017-06-06: 11:00:00 via INTRAVENOUS

## 2017-06-06 MED ORDER — DEXAMETHASONE 4 MG PO TABS: ORAL_TABLET | ORAL | 0 refills | Status: DC

## 2017-06-06 MED ORDER — DIPHENHYDRAMINE HCL 50 MG/ML IJ SOLN
INTRAMUSCULAR | Status: AC
  Filled 2017-06-06: qty 1

## 2017-06-06 NOTE — Progress Notes (Signed)
HPI:  [name] was previously seen in the [place] clinic on 05/28/2017 due to a personal and family history of cancer and concerns regarding a hereditary predisposition to cancer. Please refer to our prior cancer genetics clinic note for more information regarding [name]'s medical, social and family histories, and our assessment and recommendations, at the time. [name] recent genetic test results were disclosed to her, as well as recommendations warranted by these results. These results and recommendations are discussed in more detail below.  CANCER HISTORY:  Oncology History   Endometrioid with focal squamous  MSI stable     Endometrial cancer ([place])   02/20/2017 Pathology Results    Endometrium, biopsy - ENDOMETRIOID ADENOCARCINOMA, FIGO GRADE I, WITH SQUAMOUS DIFFERENTIATION. - SEE COMMENT      02/20/2017 Initial Diagnosis    Initially presented with PMB      03/26/2017 Pathology Results    1. Lymph node, sentinel, biopsy, right external iliac - LYMPH NODE, NEGATIVE FOR CARCINOMA (0/1). SEE NOTE 2. Lymph node, sentinel, biopsy, left external iliac - FOCUS OF METASTATIC CARCINOMA TO ONE LYMPH NODE, 0.3 MM (1/1). SEE NOTE 3. Uterus +/- tubes/ovaries, neoplastic, cervix - ENDOMETRIOID ADENOCARCINOMA WITH FOCAL SQUAMOUS DIFFERENTIATION, 3.5 CM, FIGO GRADE I, INVOLVING ANTERIOR AND POSTERIOR ENDOMETRIUM - TUMOR INVADES MORE THAN 90% OF MYOMETRIAL THICKNESS - LYMPHOVASCULAR INVASION IS IDENTIFIED - TUMOR INTRALUMINALLY EXTENDS INTO THE RIGHT FALLOPIAN TUBE; LEFT FALLOPIAN TUBE AND BILATERAL OVARIES ARE UNREMARKABLE - SEE ONCOLOGY TABLE Microscopic Comment 3. ONCOLOGY TABLE-UTERUS, CARCINOMA OR CARCINOSARCOMA  Specimen: Uterus, tubes, ovaries and cervix Procedure: Total hysterectomy and bilateral salpingo-oophorectomy Lymph node sampling performed: Yes Specimen integrity: Intact Maximum tumor size: 3.5 cm Histologic type: Endometrioid  adenocarcinoma with focal squamous differentiation Grade: I Myometrial invasion: 1.4 cm where myometrium is 1.5 cm in thickness Uterine Serosa Involvement: Not identified Cervical stromal involvement: Not identified Extent of involvement of other organs: Right fallopian tube Lymph - vascular invasion: Present Lymph nodes: Examined: 2 Sentinel 0 Non-sentinel 2 Total Lymph nodes with metastasis: 1 Isolated tumor cells (< 0.2 mm): 0 TNM code: pT1b, pN47m FIGO Stage (based on pathologic findings, needs clinical correlation): IIIC      03/26/2017 Surgery    Pre-operative Diagnosis: endometrial cancer grade 1  Operation: Robotic-assisted laparoscopic total hysterectomy with bilateral salpingoophorectomy, SLN biopsy   Surgeon: RDonaciano Eva Operative Findings:  : normal 7cm uterus, tubes and ovaries, normal appearing nodes, no apparent extrauterine disease        04/11/2017 Cancer Staging    Staging form: Corpus Uteri - Carcinoma and Carcinosarcoma, AJCC 8th Edition - Pathologic: FIGO Stage IIIC1 (pT3a, pN1, cM0) - Signed by GHeath Lark MD on 04/11/2017      04/24/2017 Procedure    Successful placement of a right IJ approach Power Port with ultrasound and fluoroscopic guidance. The catheter is ready for use.      04/25/2017 -  Chemotherapy    The patient had carboplatin and Taxol for chemotherapy treatment.        06/06/2017 Genetic Testing    The Common Hereditary Cancer Panel offered by [facility] includes sequencing and/or deletion duplication testing of the following 47 genes: APC, ATM, AXIN2, BARD1, BMPR1A, BRCA1, BRCA2, BRIP1, CDH1, CDKN2A (p14ARF), CDKN2A (p16INK4a), CKD4, CHEK2, CTNNA1, DICER1, EPCAM (Deletion/duplication testing only), GREM1 (promoter region deletion/duplication testing only), KIT, MEN1, MLH1, MSH2, MSH3, MSH6, MUTYH, NBN, NF1, NHTL1, PALB2, PDGFRA, PMS2, POLD1, POLE, PTEN, RAD50, RAD51C, RAD51D, SDHB, SDHC, SDHD, SMAD4, SMARCA4. STK11, TP53, TSC1,  TSC2, and VHL.  The following genes were evaluated for sequence changes only: SDHA and HOXB13 c.251G>A variant only.  Results: Negative, No pathogenic variants identified. The date of this test report is 06/06/2017.         FAMILY HISTORY:  We obtained a detailed, 4-generation family history.  Significant diagnoses are listed below: Family History  Problem Relation Age of Onset  . Breast cancer Mother 11  . Diabetes Brother   . Hypertension Brother   . Stroke Brother   . Breast cancer Maternal Grandmother        dx >50  . Breast cancer Maternal Aunt        dx <50  . Colon cancer Paternal Aunt   . Cancer Paternal Uncle        type unk  . Breast cancer Maternal Aunt        dx >50  . Cancer Paternal Aunt   . Cancer Paternal Uncle     [name] has 4 children. 1 died at 54 months due to SIDS, she has a 23 year old son who has 2 children, a 21 year-old daughter who has 2 children, and a 45 year-old daughter.   [name] father: died at 103, cause unk Paternal Aunts/Uncles: limited information about these relatives.  She reports she has 2 paternal aunts and 2 paternal uncles who all had cancer, 1 of them had colon cancer.  No further details known.  Paternal cousins: no known history or cancer, but limited information known about these relatives.  Paternal grandfather: unk Paternal grandmother:died at 59 with no history of cancer.   [name] mother: deceased, history of breast cancer dx at 30 Maternal Aunts/Uncles: 6 maternal aunts/uncles.  2 maternal aunts had breast cancer.  1 was diagnosed under 19, and the other was diagnosed older than 41. This aunt had a bilateral mastectomy 'to be safe'.  Maternal cousins: no known history of cancer.  Maternal grandfather: deceased >50, no history of cancer.  Maternal grandmother:deceased, history of breast cancer that was diagnosed over 24 years of age.   [name] is unaware of previous family  history of genetic testing for hereditary cancer risks. Patient's maternal ancestors are of [place] descent, and paternal ancestors are of [place] descent. There is no reported Ashkenazi Jewish ancestry. There is no known consanguinity.  GENETIC TEST RESULTS: Genetic testing performed through [facility]'s Common Hereditary Cancers Panel reported out on 06/06/2017 showed no pathogenic mutations. The Common Hereditary Cancer Panel offered by [facility] includes sequencing and/or deletion duplication testing of the following 47 genes: APC, ATM, AXIN2, BARD1, BMPR1A, BRCA1, BRCA2, BRIP1, CDH1, CDKN2A (p14ARF), CDKN2A (p16INK4a), CKD4, CHEK2, CTNNA1, DICER1, EPCAM (Deletion/duplication testing only), GREM1 (promoter region deletion/duplication testing only), KIT, MEN1, MLH1, MSH2, MSH3, MSH6, MUTYH, NBN, NF1, NHTL1, PALB2, PDGFRA, PMS2, POLD1, POLE, PTEN, RAD50, RAD51C, RAD51D, SDHB, SDHC, SDHD, SMAD4, SMARCA4. STK11, TP53, TSC1, TSC2, and VHL.  The following genes were evaluated for sequence changes only: SDHA and HOXB13 c.251G>A variant only..  The test report will be scanned into EPIC and will be located under the Molecular Pathology section of the Results Review tab. A portion of the result report is included below for reference.     We discussed with [name] that because current genetic testing is not perfect, it is possible there may be a gene mutation in one of these genes that current testing cannot detect, but that chance is small.  We also discussed, that there could be another gene that  has not yet been discovered, or that we have not yet tested, that is responsible for the cancer diagnoses in the family. It is also possible there is a hereditary cause for the cancer in the family that [name] did not inherit and therefore was not identified in her testing.  Therefore, it is important to remain in touch with cancer genetics in the future so that we can continue to offer Ms.  Rebekah [name] the most up to date genetic testing.   ADDITIONAL GENETIC TESTING: We discussed with [name] that there are other genes that are associated with increased cancer risk that can be analyzed. The laboratories that offer this testing look at these additional genes via a hereditary cancer gene panel. Should [name] wish to pursue additional genetic testing, we are happy to discuss and coordinate this testing, at any time.    CANCER SCREENING RECOMMENDATIONS: Ms.  [name] test result is considered negative (normal).  This means that we have not identified a hereditary cause for her  personal and family history of cancer at this time.   While reassuring, this does not definitively rule out a hereditary predisposition to cancer. It is still possible that there could be genetic mutations that are undetectable by current technology, or genetic mutations in genes that have not been tested or identified to increase cancer risk.  Therefore, it is recommended she continue to follow the cancer management and screening guidelines provided by her oncology and primary healthcare provider. An individual's cancer risk is not determined by genetic test results alone.  Overall cancer risk assessment includes additional factors such as personal medical history, family history, etc.  These should be used to make a personalized plan for cancer prevention and surveillance.    We updated [name]'s [place] Breast estimate. Rebekah [name] was used to estimate her risk of developing breast cancer. This estimates her lifetime risk of developing breast cancer to be approximately 8.0%.   The patient's lifetime breast cancer risk is a preliminary estimate based on available information using one of several models endorsed by the [place] (ACS). The ACS recommends consideration of breast MRI screening as an adjunct to mammography for patients at high risk (defined as 20%  or greater lifetime risk). A more detailed breast cancer risk assessment can be considered, if clinically indicated.      RECOMMENDATIONS FOR FAMILY MEMBERS:  Relatives in this family might be at some increased risk of developing cancer, over the general population risk, simply due to the family history of cancer.  We recommended women in this family have a yearly mammogram beginning at age 53, or 43 years younger than the earliest onset of cancer, an annual clinical breast exam, and perform monthly breast self-exams. Women in this family should also have a gynecological exam as recommended by their primary provider. All family members should have a colonoscopy by age 5 (or as directed by their doctors).  All family members should inform their physicians about the family history of cancer so their doctors can make the most appropriate screening recommendations for them.   It is also possible there is a hereditary cause for the cancer in [name]'s family that she did not inherit and therefore was not identified in her.  We therefore recommended her siblings and maternal relatives (especially those affected with cancer) also , have genetic counseling and testing.  Depending on what types of cancer were in her paternal family, those relatives may benefit from genetic testing as  well.  [name] will let us know if we can be of any assistance in coordinating genetic counseling and/or testing for these family members.   FOLLOW-UP: Lastly, we discussed with [name] that cancer genetics is a rapidly advancing field and it is possible that new genetic tests will be appropriate for her and/or her family members in the future. We encouraged her to remain in contact with cancer genetics on an annual basis so we can update her personal and family histories and let her know of advances in cancer genetics that may benefit this family.   Our contact number was provided. Ms.  [place] [name] questions were answered to her satisfaction, and she knows she is welcome to call us at anytime with additional questions or concerns.   Ferol Luz, MS, [facility] Certified Genetic Counselor [name]

## 2017-06-06 NOTE — Progress Notes (Signed)
Pt became very restless/agitated after receiving IV benadryl. C/O spasms in legs and headache with flushing. Sandi Mealy at chairside to assess pt. Orders given and carried out as charted on [facility] 1220 Patient reports relief of symptoms.

## 2017-06-06 NOTE — Progress Notes (Signed)
[facility] OFFICE PROGRESS NOTE  Patient Care Team: Wardell Honour, MD as PCP - General (Family Medicine)  ASSESSMENT & PLAN:  Endometrial cancer [facility]) She tolerated treatment well except for mild worsening peripheral neuropathy. She is already on Cymbalta Her blood work is satisfactory She appears to be coping better  We will proceed with cycle 3 of treatment with mild dose adjustment  Anxiety associated with depression She is coping better with lorazepam as needed.  Peripheral neuropathy due to chemotherapy [facility]) she has mild peripheral neuropathy, likely related to side effects of treatment. I plan to reduce the dose of treatment as outlined above.  I explained to the patient the rationale of this strategy and reassured the patient it would not compromise the efficacy of treatment I will reduce the dose of Taxol a little bit   No orders of the defined types were placed in this encounter.   INTERVAL HISTORY: Please see below for problem oriented charting. She returns with her daughter for further chemotherapy and follow-up She complained of some mild intermittent headaches and very mild worsening peripheral neuropathy No recent nausea or vomiting She has occasional constipation, resolved with laxatives No recent infection, fever or chills She denies recent seizures.  Her anxiety is well controlled SUMMARY OF ONCOLOGIC HISTORY: Oncology History   Endometrioid with focal squamous  MSI stable     Endometrial cancer ([place])   02/20/2017 Pathology Results    Endometrium, biopsy - ENDOMETRIOID ADENOCARCINOMA, FIGO GRADE I, WITH SQUAMOUS DIFFERENTIATION. - SEE COMMENT      02/20/2017 Initial Diagnosis    Initially presented with PMB      03/26/2017 Pathology Results    1. Lymph node, sentinel, biopsy, right external iliac - LYMPH NODE, NEGATIVE FOR CARCINOMA (0/1). SEE NOTE 2. Lymph node, sentinel, biopsy, left external iliac - FOCUS OF METASTATIC CARCINOMA  TO ONE LYMPH NODE, 0.3 MM (1/1). SEE NOTE 3. Uterus +/- tubes/ovaries, neoplastic, cervix - ENDOMETRIOID ADENOCARCINOMA WITH FOCAL SQUAMOUS DIFFERENTIATION, 3.5 CM, FIGO GRADE I, INVOLVING ANTERIOR AND POSTERIOR ENDOMETRIUM - TUMOR INVADES MORE THAN 90% OF MYOMETRIAL THICKNESS - LYMPHOVASCULAR INVASION IS IDENTIFIED - TUMOR INTRALUMINALLY EXTENDS INTO THE RIGHT FALLOPIAN TUBE; LEFT FALLOPIAN TUBE AND BILATERAL OVARIES ARE UNREMARKABLE - SEE ONCOLOGY TABLE Microscopic Comment 3. ONCOLOGY TABLE-UTERUS, CARCINOMA OR CARCINOSARCOMA  Specimen: Uterus, tubes, ovaries and cervix Procedure: Total hysterectomy and bilateral salpingo-oophorectomy Lymph node sampling performed: Yes Specimen integrity: Intact Maximum tumor size: 3.5 cm Histologic type: Endometrioid adenocarcinoma with focal squamous differentiation Grade: I Myometrial invasion: 1.4 cm where myometrium is 1.5 cm in thickness Uterine Serosa Involvement: Not identified Cervical stromal involvement: Not identified Extent of involvement of other organs: Right fallopian tube Lymph - vascular invasion: Present Lymph nodes: Examined: 2 Sentinel 0 Non-sentinel 2 Total Lymph nodes with metastasis: 1 Isolated tumor cells (< 0.2 mm): 0 TNM code: pT1b, pN38m FIGO Stage (based on pathologic findings, needs clinical correlation): IIIC      03/26/2017 Surgery    Pre-operative Diagnosis: endometrial cancer grade 1  Operation: Robotic-assisted laparoscopic total hysterectomy with bilateral salpingoophorectomy, SLN biopsy   Surgeon: RDonaciano Eva Operative Findings:  : normal 7cm uterus, tubes and ovaries, normal appearing nodes, no apparent extrauterine disease        04/11/2017 Cancer Staging    Staging form: Corpus Uteri - Carcinoma and Carcinosarcoma, AJCC 8th Edition - Pathologic: FIGO Stage IIIC1 (pT3a, pN1, cM0) - Signed by GHeath Lark MD on 04/11/2017      04/24/2017 Procedure  Successful placement of a right IJ  approach Power Port with ultrasound and fluoroscopic guidance. The catheter is ready for use.      04/25/2017 -  Chemotherapy    The patient had carboplatin and Taxol for chemotherapy treatment.        06/06/2017 Genetic Testing    The Common Hereditary Cancer Panel offered by [facility] includes sequencing and/or deletion duplication testing of the following 47 genes: APC, ATM, AXIN2, BARD1, BMPR1A, BRCA1, BRCA2, BRIP1, CDH1, CDKN2A (p14ARF), CDKN2A (p16INK4a), CKD4, CHEK2, CTNNA1, DICER1, EPCAM (Deletion/duplication testing only), GREM1 (promoter region deletion/duplication testing only), KIT, MEN1, MLH1, MSH2, MSH3, MSH6, MUTYH, NBN, NF1, NHTL1, PALB2, PDGFRA, PMS2, POLD1, POLE, PTEN, RAD50, RAD51C, RAD51D, SDHB, SDHC, SDHD, SMAD4, SMARCA4. STK11, TP53, TSC1, TSC2, and VHL.  The following genes were evaluated for sequence changes only: SDHA and HOXB13 c.251G>A variant only.  Results: Negative, No pathogenic variants identified. The date of this test report is 06/06/2017.        REVIEW OF SYSTEMS:   Constitutional: Denies fevers, chills or abnormal weight loss Eyes: Denies blurriness of vision Ears, nose, mouth, throat, and face: Denies mucositis or sore throat Respiratory: Denies cough, dyspnea or wheezes Cardiovascular: Denies palpitation, chest discomfort or lower extremity swelling Gastrointestinal:  Denies nausea, heartburn or change in bowel habits Skin: Denies abnormal skin rashes Lymphatics: Denies new lymphadenopathy or easy bruising Neurological:Denies numbness, tingling or new weaknesses Behavioral/Psych: Mood is stable, no new changes  All other systems were reviewed with the patient and are negative.  I have reviewed the past medical history, past surgical history, social history and family history with the patient and they are unchanged from previous note.  ALLERGIES:  is allergic to [employer] tartrate] and soma [carisoprodol].  MEDICATIONS:  Current Outpatient  Medications  Medication Sig Dispense Refill  . citalopram (CELEXA) 20 MG tablet Take 1 tablet (20 mg total) by mouth daily. 90 tablet 0  . dexamethasone (DECADRON) 4 MG tablet Take 5 tabs the night before and 5 tabs the morning of chemotherapy, every 3 weeks, take with food 60 tablet 0  . diphenhydramine-acetaminophen (TYLENOL PM) 25-500 MG TABS tablet Take 1 tablet by mouth at bedtime.    . DULoxetine (CYMBALTA) 60 MG capsule Take 2 capsules (120 mg total) by mouth daily. (Patient taking differently: Take 60 mg by mouth 2 (two) times daily. ) 180 capsule 0  . levETIRAcetam (KEPPRA) 1000 MG tablet Take 1,500 mg by mouth 2 (two) times daily.    Marland Kitchen lidocaine-prilocaine (EMLA) cream Apply to affected area once 30 g 3  . LORazepam (ATIVAN) 0.5 MG tablet Take 1 tablet (0.5 mg total) by mouth every 8 (eight) hours as needed for anxiety. 60 tablet 0  . Multiple Vitamin (MULTIVITAMIN) capsule Take 1 capsule by mouth daily.    . ondansetron (ZOFRAN) 8 MG tablet Take 1 tablet (8 mg total) by mouth every 8 (eight) hours as needed for refractory nausea / vomiting. Start on day 3 after chemo. 30 tablet 1  . prochlorperazine (COMPAZINE) 10 MG tablet Take 1 tablet (10 mg total) by mouth every 6 (six) hours as needed (Nausea or vomiting). 30 tablet 1  . ranitidine (ZANTAC) 150 MG capsule Take 150 mg by mouth daily.     No current facility-administered medications for this visit.    Facility-Administered Medications Ordered in Other Visits  Medication Dose Route Frequency Provider Last Rate Last Dose  . acetaminophen (TYLENOL) tablet 650 mg  650 mg Oral Q6H PRN Harle Stanford.,  PA-C   650 mg at 06/06/17 1207  . CARBOplatin (PARAPLATIN) 690 mg in sodium chloride 0.9 % 250 mL chemo infusion  690 mg Intravenous Once Alvy Bimler, Tukker Byrns, MD      . heparin lock flush 100 unit/mL  500 Units Intracatheter Once PRN Alvy Bimler, Jazlen Ogarro, MD      . PACLitaxel (TAXOL) 234 mg in sodium chloride 0.9 % 250 mL chemo infusion (> '80mg'$ /m2)  131.25  mg/m2 (Treatment Plan Recorded) Intravenous Once Alvy Bimler, Cully Luckow, MD 96 mL/hr at 06/06/17 1246 234 mg at 06/06/17 1246  . sodium chloride flush (NS) 0.9 % injection 10 mL  10 mL Intracatheter PRN Alvy Bimler, Venba Zenner, MD        PHYSICAL EXAMINATION: ECOG PERFORMANCE STATUS: 1 - Symptomatic but completely ambulatory  Vitals:   06/06/17 1002  BP: (!) 128/93  Pulse: 95  Resp: 18  Temp: 98.7 F (37.1 C)  SpO2: 99%   Filed Weights   06/06/17 1002  Weight: 165 lb 11.2 oz (75.2 kg)    GENERAL:alert, no distress and comfortable SKIN: skin color, texture, turgor are normal, no rashes or significant lesions EYES: normal, Conjunctiva are pink and non-injected, sclera clear OROPHARYNX:no exudate, no erythema and lips, buccal mucosa, and tongue normal  NECK: supple, thyroid normal size, non-tender, without nodularity LYMPH:  no palpable lymphadenopathy in the cervical, axillary or inguinal LUNGS: clear to auscultation and percussion with normal breathing effort HEART: regular rate & rhythm and no murmurs and no lower extremity edema ABDOMEN:abdomen soft, non-tender and normal bowel sounds Musculoskeletal:no cyanosis of digits and no clubbing  NEURO: alert & oriented x 3 with fluent speech, no focal motor/sensory deficits  LABORATORY DATA:  I have reviewed the data as listed    Component Value Date/Time   NA 137 06/06/2017 0904   NA 140 11/07/2016 1551   K 3.7 06/06/2017 0904   CL 104 06/06/2017 0904   CO2 24 06/06/2017 0904   GLUCOSE 144 (H) 06/06/2017 0904   BUN 12 06/06/2017 0904   BUN 11 11/07/2016 1551   CREATININE 0.71 06/06/2017 0904   CREATININE 0.79 12/02/2015 1146   CALCIUM 9.5 06/06/2017 0904   PROT 7.3 06/06/2017 0904   PROT 6.7 11/07/2016 1551   ALBUMIN 3.9 06/06/2017 0904   ALBUMIN 4.4 11/07/2016 1551   AST 19 06/06/2017 0904   ALT 21 06/06/2017 0904   ALKPHOS 82 06/06/2017 0904   BILITOT 0.3 06/06/2017 0904   GFRNONAA >60 06/06/2017 0904   GFRAA >60 06/06/2017 0904     No results found for: SPEP, UPEP  Lab Results  Component Value Date   WBC 5.2 06/06/2017   NEUTROABS 4.0 06/06/2017   HGB 12.7 06/06/2017   HCT 37.2 06/06/2017   MCV 97.6 06/06/2017   PLT 192 06/06/2017      Chemistry      Component Value Date/Time   NA 137 06/06/2017 0904   NA 140 11/07/2016 1551   K 3.7 06/06/2017 0904   CL 104 06/06/2017 0904   CO2 24 06/06/2017 0904   BUN 12 06/06/2017 0904   BUN 11 11/07/2016 1551   CREATININE 0.71 06/06/2017 0904   CREATININE 0.79 12/02/2015 1146      Component Value Date/Time   CALCIUM 9.5 06/06/2017 0904   ALKPHOS 82 06/06/2017 0904   AST 19 06/06/2017 0904   ALT 21 06/06/2017 0904   BILITOT 0.3 06/06/2017 0904      All questions were answered. The patient knows to call the clinic with any problems, questions  or concerns. No barriers to learning was detected.  I spent 15 minutes counseling the patient face to face. The total time spent in the appointment was 20 minutes and more than 50% was on counseling and review of test results  Heath Lark, MD 06/06/2017 1:18 PM

## 2017-06-06 NOTE — Assessment & Plan Note (Signed)
She tolerated treatment well except for mild worsening peripheral neuropathy. She is already on Cymbalta Her blood work is satisfactory She appears to be coping better  We will proceed with cycle 3 of treatment with mild dose adjustment

## 2017-06-06 NOTE — Telephone Encounter (Signed)
Per patient request, results were disclosed to her daughter, Rebekah Deeds'.    Revealed negative genetic testing.  This normal result is reassuring and indicates that it is unlikely Rebekah [name]'s cancer is due to a hereditary cause.  It is unlikely that there is an increased risk of another cancer due to a mutation in one of these genes.  However, genetic testing is not perfect, and cannot definitively rule out a hereditary cause.  It will be important for her to keep in contact with genetics to learn if any additional testing may be needed in the future.     Recommended other relatives also have genetic testing, especially those affected with cancer as there could still be a genetic cause for the cancer in the family Rebekah [name] did not inherit and therefore was not found in her.

## 2017-06-06 NOTE — Assessment & Plan Note (Signed)
She is coping better with lorazepam as needed.

## 2017-06-06 NOTE — Assessment & Plan Note (Signed)
she has mild peripheral neuropathy, likely related to side effects of treatment. I plan to reduce the dose of treatment as outlined above.  I explained to the patient the rationale of this strategy and reassured the patient it would not compromise the efficacy of treatment I will reduce the dose of Taxol a little bit

## 2017-06-06 NOTE — Patient Instructions (Signed)
   [facility] Discharge Instructions for Patients Receiving Chemotherapy  Today you received the following chemotherapy agents Taxol and Carboplatin   To help prevent nausea and vomiting after your treatment, we encourage you to take your nausea medication as directed.    If you develop nausea and vomiting that is not controlled by your nausea medication, call the clinic.   BELOW ARE SYMPTOMS THAT SHOULD BE REPORTED IMMEDIATELY:  *FEVER GREATER THAN 100.5 F  *CHILLS WITH OR WITHOUT FEVER  NAUSEA AND VOMITING THAT IS NOT CONTROLLED WITH YOUR NAUSEA MEDICATION  *UNUSUAL SHORTNESS OF BREATH  *UNUSUAL BRUISING OR BLEEDING  TENDERNESS IN MOUTH AND THROAT WITH OR WITHOUT PRESENCE OF ULCERS  *URINARY PROBLEMS  *BOWEL PROBLEMS  UNUSUAL RASH Items with * indicate a potential emergency and should be followed up as soon as possible.  Feel free to call the clinic should you have any questions or concerns. The clinic phone number is [phone].  Please show the CHEMO ALERT CARD at check-in to the Emergency Department and triage nurse.   

## 2017-06-07 ENCOUNTER — Encounter: Payer: Self-pay | Admitting: Hematology and Oncology

## 2017-06-07 ENCOUNTER — Ambulatory Visit: Payer: BLUE CROSS/BLUE SHIELD | Admitting: Family Medicine

## 2017-06-07 NOTE — Progress Notes (Signed)
   DATE: 06/06/2017      X CHEMO/IMMUNOTHERAPY REACTION            MD: Alvy Bimler   AGENT/BLOOD PRODUCT RECEIVING TODAY:   Carboplatin and paclitaxel  AGENT/BLOOD PRODUCT RECEIVING IMMEDIATELY PRIOR TO REACTION: Benadryl 50 mg IV  REACTION(S): Restless leg, leg pain and headaches  INTERVENTION:  Tylenol 650 mg p.o. x1, Ativan 0.5 mg IV x1, and morphine sulfate 1 mg IV x1  Review of Systems  Constitutional: Negative for diaphoresis.  HENT: Negative for trouble swallowing.   Respiratory: Negative for cough, choking, chest tightness, shortness of breath and wheezing.   Cardiovascular: Negative for chest pain and palpitations.  Gastrointestinal: Negative for nausea and vomiting.  Musculoskeletal: Positive for myalgias. Negative for back pain.  Skin: Negative for color change and rash.  Neurological: Positive for headaches. Negative for dizziness and light-headedness.       Restless leg    Physical Exam  Constitutional: No distress.  HENT:  Head: Normocephalic and atraumatic.  Cardiovascular: Normal rate, regular rhythm and normal heart sounds. Exam reveals no gallop and no friction rub.  No murmur heard. Pulmonary/Chest: Effort normal and breath sounds normal. No respiratory distress. She has no wheezes. She has no rales.  Neurological: She is alert.  Involuntary lower extremity movement  Skin: Skin is warm and dry. No rash noted. She is not diaphoretic. No erythema.    OUTCOME: Patient responded to intervention. Chemo/Immunotherapy restarted and completed.   Sandi Mealy, MHS, PA-C

## 2017-06-10 ENCOUNTER — Encounter: Payer: Self-pay | Admitting: Family Medicine

## 2017-06-10 ENCOUNTER — Encounter: Payer: Self-pay | Admitting: Hematology and Oncology

## 2017-06-12 ENCOUNTER — Telehealth: Payer: Self-pay | Admitting: *Deleted

## 2017-06-12 ENCOUNTER — Ambulatory Visit: Payer: BLUE CROSS/BLUE SHIELD | Admitting: Family Medicine

## 2017-06-12 NOTE — Telephone Encounter (Signed)
CALLED PATIENT TO REMIND OF HDR [place] 06-13-17 @ 3 PM, SPOKE WITH PATIENT AND SHE IS AWARE OF THIS [place].

## 2017-06-13 ENCOUNTER — Ambulatory Visit
Admission: RE | Admit: 2017-06-13 | Discharge: 2017-06-13 | Disposition: A | Discharge status: Home / Self Care | Payer: BLUE CROSS/BLUE SHIELD | Source: Ambulatory Visit | Attending: Radiation Oncology | Admitting: Radiation Oncology

## 2017-06-13 DIAGNOSIS — C541 Malignant neoplasm of endometrium: Secondary | ICD-10-CM

## 2017-06-13 DIAGNOSIS — Z510 Encounter for antineoplastic radiation therapy: Secondary | ICD-10-CM | POA: Diagnosis not present

## 2017-06-13 NOTE — Progress Notes (Signed)
  Radiation Oncology         [phone] ________________________________  Name: Rebekah [name] MRN: [id number]  Date: 06/13/2017  DOB: 05/10/1955  CC: Wardell Honour, MD  Everitt Amber, MD  HDR BRACHYTHERAPY NOTE  DIAGNOSIS: 62 y.o. female with Stage IIIC1, grade 1 endometrial cancer   Simple treatment device note: Patient had construction of her custom vaginal cylinder. She will be treated with a 2.5 cm diameter segmented cylinder. This conforms to her anatomy without undue discomfort.  Vaginal brachytherapy procedure node: The patient was brought to the [place] suite. Identity was confirmed. All relevant records and images related to the planned course of therapy were reviewed. The patient freely provided informed written consent to proceed with treatment after reviewing the details related to the planned course of therapy. The consent form was witnessed and verified by the simulation staff. Then, the patient was set-up in a stable reproducible supine position for radiation therapy. Pelvic exam revealed the vaginal cuff to be intact . The patient's custom vaginal cylinder was placed in the proximal vagina. This was affixed to the CT/MR stabilization plate to prevent slippage. Patient tolerated the placement well.  Verification simulation note:  A fiducial marker was placed within the vaginal cylinder. An AP and lateral film was then obtained through the pelvis area. This documented accurate position of the vaginal cylinder for treatment.  HDR BRACHYTHERAPY TREATMENT  The remote afterloading device was affixed to the vaginal cylinder by catheter. Patient then proceeded to undergo her fourth high-dose-rate treatment directed at the proximal vagina. The patient was prescribed a dose of 6.0 gray to be delivered to the mucosal surface. Treatment length was 3.5 cm. Patient was treated with 1 channel using 8 dwell positions. Treatment time was 241.1 seconds. Iridium 192 was the high-dose-rate  source for treatment. The patient tolerated the treatment well. After completion of her therapy, a radiation survey was performed documenting return of the iridium source into the [employer] safe.   PLAN: The patient will return on 06/20/17 for her fifth and final HDR treatment. ________________________________   Blair Promise, PhD, MD  This document serves as a record of services personally performed by Gery Pray, MD. It was created on his behalf by Rae Lips, a trained medical scribe. The creation of this record is based on the scribe's personal observations and the provider's statements to them. This document has been checked and approved by the attending provider.

## 2017-06-19 ENCOUNTER — Telehealth: Payer: Self-pay | Admitting: *Deleted

## 2017-06-19 NOTE — Telephone Encounter (Signed)
CALLED PATIENT TO REMIND OF HDR [place] 06-20-17 @ 2 PM, SPOKE WITH PATIENT AND SHE IS AWARE OF THIS [place].

## 2017-06-20 ENCOUNTER — Encounter: Payer: Self-pay | Admitting: Family Medicine

## 2017-06-20 ENCOUNTER — Ambulatory Visit
Admission: RE | Admit: 2017-06-20 | Discharge: 2017-06-20 | Disposition: A | Discharge status: Home / Self Care | Payer: BLUE CROSS/BLUE SHIELD | Source: Ambulatory Visit | Attending: Radiation Oncology | Admitting: Radiation Oncology

## 2017-06-20 ENCOUNTER — Other Ambulatory Visit: Payer: Self-pay

## 2017-06-20 ENCOUNTER — Ambulatory Visit: Payer: BLUE CROSS/BLUE SHIELD | Admitting: Family Medicine

## 2017-06-20 ENCOUNTER — Encounter: Payer: Self-pay | Admitting: Radiation Oncology

## 2017-06-20 VITALS — BP 100/80 | HR 96 | Temp 99.1°F | Resp 16 | Ht 60.35 in | Wt 163.0 lb

## 2017-06-20 DIAGNOSIS — C541 Malignant neoplasm of endometrium: Secondary | ICD-10-CM | POA: Insufficient documentation

## 2017-06-20 DIAGNOSIS — Z510 Encounter for antineoplastic radiation therapy: Secondary | ICD-10-CM | POA: Insufficient documentation

## 2017-06-20 DIAGNOSIS — F418 Other specified anxiety disorders: Secondary | ICD-10-CM | POA: Diagnosis not present

## 2017-06-20 DIAGNOSIS — R42 Dizziness and giddiness: Secondary | ICD-10-CM | POA: Diagnosis not present

## 2017-06-20 DIAGNOSIS — M797 Fibromyalgia: Secondary | ICD-10-CM | POA: Diagnosis not present

## 2017-06-20 DIAGNOSIS — C530 Malignant neoplasm of endocervix: Secondary | ICD-10-CM

## 2017-06-20 DIAGNOSIS — R202 Paresthesia of skin: Secondary | ICD-10-CM | POA: Diagnosis not present

## 2017-06-20 DIAGNOSIS — R110 Nausea: Secondary | ICD-10-CM | POA: Diagnosis not present

## 2017-06-20 MED ORDER — SCOPOLAMINE 1 MG/3DAYS TD PT72: 1.0000 | MEDICATED_PATCH | TRANSDERMAL | 12 refills | Status: DC

## 2017-06-20 NOTE — Progress Notes (Signed)
Chief Complaint  Patient presents with  . Anxiety  . Nausea    HPI  She takes ativan as needed  She reports that she takes the ativan only 3-4 times  She takes the ativan for panic attacks and agitation She has only needed it 3-4 times  She reports that after her treatments she gets nausea and dizziness She reports that the nausea and dizziness is after every treatment She gets IV nausea medication during her treatment that should last for 3 days She is getting steroid and benadryl during treatments  Her treatments are every 3 weeks and there is a plan for three more treatments of Chemo She is getting her last radiation treatment today with Dr. Sondra Come  She sees Dr. Alvy Bimler for Oncology and Dr. Sondra Come for Radiation for Endometrial cancer   She eats once a day She cannot tolerate food more often than that  She reports that she is taking the Duloxetine and Citalopram for anxiety She states that after her treatments she gets a headache for a week She is on Decadron 20mg  the night before and the morning of her treatments   Wt Readings from Last 3 Encounters:  06/20/17 163 lb (73.9 kg)  06/06/17 165 lb 11.2 oz (75.2 kg)  05/23/17 162 lb 8 oz (73.7 kg)    Past Medical History:  Diagnosis Date  . Anxiety   . Arthritis   . Asthma    NO INHALERS USED  . Cancer ([place])    ENDOMETRIAL  . Depression   . Family history of breast cancer   . Family history of colon cancer   . Fibromyalgia   . GERD (gastroesophageal reflux disease)   . Insomnia   . Seizure disorder ([place])    LAST SEIZURE FEBRUARY 2019 SEES DR [facility]   . Seizures ([place])     Current Outpatient Medications  Medication Sig Dispense Refill  . citalopram (CELEXA) 20 MG tablet Take 1 tablet (20 mg total) by mouth daily. 90 tablet 0  . dexamethasone (DECADRON) 4 MG tablet Take 5 tabs the night before and 5 tabs the morning of chemotherapy, every 3 weeks, take with food 60 tablet 0  .  diphenhydramine-acetaminophen (TYLENOL PM) 25-500 MG TABS tablet Take 1 tablet by mouth at bedtime.    . DULoxetine (CYMBALTA) 60 MG capsule Take 2 capsules (120 mg total) by mouth daily. (Patient taking differently: Take 60 mg by mouth 2 (two) times daily. ) 180 capsule 0  . levETIRAcetam (KEPPRA) 1000 MG tablet Take 1,500 mg by mouth 2 (two) times daily.    Marland Kitchen lidocaine-prilocaine (EMLA) cream Apply to affected area once 30 g 3  . LORazepam (ATIVAN) 0.5 MG tablet Take 1 tablet (0.5 mg total) by mouth every 8 (eight) hours as needed for anxiety. 60 tablet 0  . Multiple Vitamin (MULTIVITAMIN) capsule Take 1 capsule by mouth daily.    . ondansetron (ZOFRAN) 8 MG tablet Take 1 tablet (8 mg total) by mouth every 8 (eight) hours as needed for refractory nausea / vomiting. Start on day 3 after chemo. 30 tablet 1  . prochlorperazine (COMPAZINE) 10 MG tablet Take 1 tablet (10 mg total) by mouth every 6 (six) hours as needed (Nausea or vomiting). 30 tablet 1  . ranitidine (ZANTAC) 150 MG capsule Take 150 mg by mouth daily.    Marland Kitchen scopolamine (TRANSDERM-SCOP) 1 MG/3DAYS Place 1 patch (1.5 mg total) onto the skin every 3 (three) days. 10 patch 12   No  current facility-administered medications for this visit.     Allergies:  Allergies  Allergen Reactions  . Ambien [Zolpidem Tartrate] Other (See Comments)    Other reaction(s): Hallucination  . Soma [Carisoprodol] Other (See Comments)    chills    Past Surgical History:  Procedure Laterality Date  . ABDOMINAL HYSTERECTOMY    . BREAST BIOPSY Right    biopsy in the 1990's  . BUNIONECTOMY Bilateral    2000's - R ONCE and L once  . CARPAL TUNNEL RELEASE Bilateral   . COLONSCOPY    . ENDOMETRIAL BIOPSY  02/2017  . IR FLUORO GUIDE PORT INSERTION RIGHT  04/24/2017  . IR US GUIDE VASC ACCESS RIGHT  04/24/2017  . ROBOTIC ASSISTED TOTAL HYSTERECTOMY WITH BILATERAL SALPINGO OOPHERECTOMY Bilateral 03/26/2017   Procedure: XI ROBOTIC ASSISTED TOTAL HYSTERECTOMY  WITH BILATERAL SALPINGO OOPHORECTOMY AND SENTINAL LYMPH NODES;  Surgeon: Everitt Amber, MD;  Location: [facility] ORS;  Service: Gynecology;  Laterality: Bilateral;  . TUBAL LIGATION  1984  . TUBAL LIGATION      Social History   Socioeconomic History  . Marital status: Married    Spouse name: Not on file  . Number of children: 4  . Years of education: 8  . Highest education level: Not on file  Occupational History  . 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 Financial resource strain: Not on file  . Food insecurity:    Worry: Not on file    Inability: Not on file  . Transportation needs:    Medical: Not on file    Non-medical: Not on file  Tobacco Use  . Smoking status: Never Smoker  . Smokeless tobacco: Never Used  Substance and Sexual Activity  . Alcohol use: No    Alcohol/week: 0.0 oz  . Drug use: No  . Sexual activity: Not Currently    Partners: Male    Birth control/protection: Post-menopausal  Lifestyle  . Physical activity:    Days per week: Not on file    Minutes per session: Not on file  . Stress: Not on file  Relationships  . Social connections:    Talks on phone: Not on file    Gets together: Not on file    Attends religious service: Not on file    Active member of club or organization: Not on file    Attends meetings of clubs or organizations: Not on file    Relationship status: Not on file  Other Topics Concern  . Not on file  Social History Narrative   Lives with daughter, [name]   Caffeine use: coffee/tea/soda daily    Family History  Problem Relation Age of Onset  . Breast cancer Mother 2  . Diabetes Brother   . Hypertension Brother   . Stroke Brother   . Breast cancer Maternal Grandmother        dx >50  . Breast cancer Maternal Aunt        dx <50  . Colon cancer Paternal Aunt   . Cancer Paternal Uncle        type unk  . Breast cancer Maternal Aunt        dx >50  . Cancer Paternal Aunt   . Cancer Paternal Uncle      ROS Review of  Systems See HPI Constitution: No fevers or chills No malaise No diaphoresis Skin: No rash or itching Eyes: no blurry vision, no double vision GU: no dysuria or hematuria Neuro: no dizziness or headaches all  others reviewed and negative   Objective: Vitals:   06/20/17 0922  BP: 100/80  Pulse: 96  Resp: 16  Temp: 99.1 F (37.3 C)  SpO2: 97%  Weight: 163 lb (73.9 kg)  Height: 5' 0.35" (1.533 m)    Physical Exam General: alert, oriented, in NAD Head: normocephalic, atraumatic, no sinus tenderness Eyes: EOM intact, no scleral icterus or conjunctival injection, pupils equal  Ears: TM clear bilaterally Nose: mucosa nonerythematous, nonedematous Throat: no pharyngeal exudate or erythema Lymph: no posterior auricular, submental or cervical lymph adenopathy Heart: normal rate, normal sinus rhythm, no murmurs Lungs: clear to auscultation bilaterally, no wheezing   Assessment and Plan Jerusalen was seen today for anxiety and nausea.  Diagnoses and all orders for this visit:  Fibromyalgia- discussed aspercreme with lidocaine Continue Cymbalta for pain  Dizziness- discussed that she has multiple medications which would have dizziness as a side effect  Anxiety associated with depression- continue Celexa, Duloxetine and ativan  Paresthesias- continue Cymbalta Advised increase hydration  Reviewed labs, no electrolyte deficiencies  Nausea without vomiting- continue treatment day nausea plan After the 3 days around treatment time should use scopolamine patch  Other orders -     scopolamine (TRANSDERM-SCOP) 1 MG/3DAYS; Place 1 patch (1.5 mg total) onto the skin every 3 (three) days.  A total of 30 minutes were spent face-to-face with the patient during this encounter and over half of that time was spent on counseling and coordination of care.   [place]

## 2017-06-20 NOTE — Patient Instructions (Addendum)
For Dizziness scopolamine  Apply patch for dizziness in between treatments Let your treatment team know that you have the patches  For Fibromyalgia Try Aspercreme with Lidocaine to the affected areas    IF you received an x-ray today, you will receive an invoice from [facility]. Please contact [facility] at [phone] with questions or concerns regarding your invoice.   IF you received labwork today, you will receive an invoice from [place]. Please contact [facility] at [phone] with questions or concerns regarding your invoice.   Our billing staff will not be able to assist you with questions regarding bills from these companies.  You will be contacted with the lab results as soon as they are available. The fastest way to get your results is to activate your My Chart account. Instructions are located on the last page of this paperwork. If you have not heard from [place] regarding the results in 2 weeks, please contact this office.     Scopolamine skin patches What is this medicine? SCOPOLAMINE (skoe POL a meen) is used to prevent nausea and vomiting caused by motion sickness, anesthesia and surgery. This medicine may be used for other purposes; ask your health care provider or pharmacist if you have questions. COMMON BRAND NAME(S): Transderm Scop What should I tell my health care provider before I take this medicine? They need to know if you have any of these conditions: -glaucoma -kidney or liver disease -an unusual or allergic reaction (especially skin allergy) to scopolamine, atropine, other medicines, foods, dyes, or preservatives -pregnant or trying to get pregnant -breast-feeding How should I use this medicine? This medicine is for external use only. Follow the directions on the prescription label. One patch contains enough medicine to prevent motion sickness for up to 3 days. Apply the patch at least 4 hours before you need it and only wear one disc at a  time. Choose an area behind the ear, that is clean, dry, hairless and free from any cuts or irritation. Wipe the area with a clean dry tissue. Peel off the plastic backing of the skin patch, trying not to touch the adhesive side with your hands. Do not cut the patches. Firmly apply to the area you have chosen, with the metallic side of the patch to the skin and the tan-colored side showing. Once firmly in place, wash your hands well with soap and water. Remove the disc after 3 days, or sooner if you no longer need it. After removing the patch, wash your hands and the area behind your ear thoroughly with soap and water. The patch will still contain some medicine after use. To avoid accidental contact or ingestion by children or pets, fold the used patch in half with the sticky side together and throw away in the trash out of the reach of children and pets. If you need to use a second patch after you remove the first, place it behind the other ear. Talk to your pediatrician regarding the use of this medicine in children. Special care may be needed. Overdosage: If you think you have taken too much of this medicine contact a poison control center or emergency room at once. NOTE: This medicine is only for you. Do not share this medicine with others. What if I miss a dose? Make sure you apply the patch at least 4 hours before you need it. You can apply it the night before traveling. What may interact with this medicine? -benztropine -bethanechol -medicines for anxiety or sleeping problems like  diazepam or temazepam -medicines for hay fever and other allergies -medicines for mental depression -muscle relaxants This list may not describe all possible interactions. Give your health care provider a list of all the medicines, herbs, non-prescription drugs, or dietary supplements you use. Also tell them if you smoke, drink alcohol, or use illegal drugs. Some items may interact with your medicine. What should I  watch for while using this medicine? Keep the patch dry, if possible, to prevent it from falling off. Limited contact with water, however, as in bathing or swimming, will not affect the system. If the patch falls off, throw it away and put a new one behind the other ear. You may get drowsy or dizzy. Do not drive, use machinery, or do anything that needs mental alertness until you know how this medicine affects you. Do not stand or sit up quickly, especially if you are an older patient. This reduces the risk of dizzy or fainting spells. Alcohol may interfere with the effect of this medicine. Avoid alcoholic drinks. Your mouth may get dry. Chewing sugarless gum or sucking hard candy, and drinking plenty of water may help. Contact your doctor if the problem does not go away or is severe. This medicine may cause dry eyes and blurred vision. If you wear contact lenses you may feel some discomfort. Lubricating drops may help. See your eye doctor if the problem does not go away or is severe. If you are going to have a magnetic resonance imaging (MRI) procedure, tell your MRI technician if you have this patch on your body. It must be removed before a MRI. What side effects may I notice from receiving this medicine? Side effects that you should report to your doctor or health care professional as soon as possible: -agitation, nervousness, confusion -blurred vision and other eye problems -dizziness, drowsiness -eye pain or redness in the whites of the eye -hallucinations -pain or difficulty passing urine -skin rash, itching -vomiting Side effects that usually do not require medical attention (report to your doctor or health care professional if they continue or are bothersome): -headache -nausea This list may not describe all possible side effects. Call your doctor for medical advice about side effects. You may report side effects to FDA at 1-800-FDA-1088. Where should I keep my medicine? Keep out of the  reach of children. Store at room temperature between 20 and 25 degrees C (68 and 77 degrees F). Throw away any unused medicine after the expiration date. When you remove a patch, fold it and throw it in the trash as described above. NOTE: This sheet is a summary. It may not cover all possible information. If you have questions about this medicine, talk to your doctor, pharmacist, or health care provider.  2018 Elsevier/Gold Standard (2011-05-31 13:31:48)

## 2017-06-20 NOTE — Progress Notes (Signed)
  Radiation Oncology         [phone] ________________________________  Name: Rebekah [name] MRN: [id number]  Date: 06/20/2017  DOB: 21-Feb-1955  End of Treatment Note  Diagnosis: Stage IIIC1, grade 1 endometrial cancer     Indication for treatment:  Curative, postop, adjuvant       Radiation treatment dates:   05/23/2017 - 06/20/2017  Site/dose:   Vaginal cuff / 30 Gy delivered in 5 fractions of 6 Gy  Beams/energy:  HDR Ir-192 Vaginal/ 2.5 cm diameter segmented cylinder with treatment length of 3.5 cm. prescription to the vaginal mucosal surface  Narrative: The patient tolerated radiation treatment relatively well. No symptoms or issues were noted throughout.  Plan: The patient has completed radiation treatment. The patient will return to radiation oncology clinic for routine followup in one month. I advised them to call or return sooner if they have any questions or concerns related to their recovery or treatment. She will continue with additional chemotherapy  -----------------------------------  Blair Promise, PhD, MD  This document serves as a record of services personally performed by Gery Pray, MD. It was created on his behalf by Wilburn Mylar, a trained medical scribe. The creation of this record is based on the scribe's personal observations and the provider's statements to them. This document has been checked and approved by the attending provider.

## 2017-06-20 NOTE — Progress Notes (Signed)
  Radiation Oncology         [phone] ________________________________  Name: Rebekah [name] MRN: [id number]  Date: 06/20/2017  DOB: September 30, 1955  CC: Wardell Honour, MD  Everitt Amber, MD  HDR BRACHYTHERAPY NOTE  DIAGNOSIS: Stage IIIC1, grade 1 endometrial cancer.     Simple treatment device note: Patient had construction of her custom vaginal cylinder. She will be treated with a 2.5 cm diameter segmented cylinder. This conforms to her anatomy without undue discomfort.  Vaginal brachytherapy procedure node: The patient was brought to the [facility] suite. Identity was confirmed. All relevant records and images related to the planned course of therapy were reviewed. The patient freely provided informed written consent to proceed with treatment after reviewing the details related to the planned course of therapy. The consent form was witnessed and verified by the simulation staff. Then, the patient was set-up in a stable reproducible supine position for radiation therapy. Pelvic exam revealed the vaginal cuff to be intact. The patient's custom vaginal cylinder was placed in the proximal vagina. This was affixed to the CT/MR stabilization plate to prevent slippage. Patient tolerated the placement well.  Verification simulation note:  A fiducial marker was placed within the vaginal cylinder. An AP and lateral film was then obtained through the pelvis area. This documented accurate position of the vaginal cylinder for treatment.  HDR BRACHYTHERAPY TREATMENT  The remote afterloading device was affixed to the vaginal cylinder by catheter. Patient then proceeded to undergo her 5th high-dose-rate treatment directed at the proximal vagina. The patient was prescribed a dose of 6 gray to be delivered to the mucosal surface. Treatment length was 3.5 cm. Patient was treated with 1 channel using 8 dwell positions. Treatment time was 257.5 seconds. Iridium 192 was the high-dose-rate source for treatment.  The patient tolerated the treatment well. After completion of her therapy, a radiation survey was performed documenting return of the iridium source into the [employer] safe.   PLAN: Patient has finished her brachytherapy treatment. She will be scheduled for follow-up on 1 month. ________________________________  Blair Promise, PhD, MD   This document serves as a record of services personally performed by Gery Pray, MD. It was created on his behalf by Wilburn Mylar, a trained medical scribe. The creation of this record is based on the scribe's personal observations and the provider's statements to them. This document has been checked and approved by the attending provider.

## 2017-06-27 ENCOUNTER — Inpatient Hospital Stay: Payer: BLUE CROSS/BLUE SHIELD

## 2017-06-27 ENCOUNTER — Inpatient Hospital Stay (HOSPITAL_BASED_OUTPATIENT_CLINIC_OR_DEPARTMENT_OTHER): Payer: BLUE CROSS/BLUE SHIELD | Admitting: Hematology and Oncology

## 2017-06-27 ENCOUNTER — Encounter: Payer: Self-pay | Admitting: Hematology and Oncology

## 2017-06-27 ENCOUNTER — Inpatient Hospital Stay: Payer: BLUE CROSS/BLUE SHIELD | Admitting: Nutrition

## 2017-06-27 ENCOUNTER — Inpatient Hospital Stay: Payer: BLUE CROSS/BLUE SHIELD | Attending: Gynecologic Oncology

## 2017-06-27 ENCOUNTER — Telehealth: Payer: Self-pay | Admitting: Hematology and Oncology

## 2017-06-27 VITALS — BP 121/92 | HR 103

## 2017-06-27 VITALS — BP 130/97 | HR 107 | Temp 98.5°F | Resp 18 | Ht 60.35 in | Wt 162.6 lb

## 2017-06-27 DIAGNOSIS — E860 Dehydration: Secondary | ICD-10-CM | POA: Insufficient documentation

## 2017-06-27 DIAGNOSIS — Z5111 Encounter for antineoplastic chemotherapy: Secondary | ICD-10-CM | POA: Diagnosis not present

## 2017-06-27 DIAGNOSIS — K521 Toxic gastroenteritis and colitis: Secondary | ICD-10-CM | POA: Insufficient documentation

## 2017-06-27 DIAGNOSIS — D701 Agranulocytosis secondary to cancer chemotherapy: Secondary | ICD-10-CM | POA: Diagnosis not present

## 2017-06-27 DIAGNOSIS — T451X5A Adverse effect of antineoplastic and immunosuppressive drugs, initial encounter: Secondary | ICD-10-CM | POA: Diagnosis not present

## 2017-06-27 DIAGNOSIS — C541 Malignant neoplasm of endometrium: Secondary | ICD-10-CM | POA: Insufficient documentation

## 2017-06-27 DIAGNOSIS — R5381 Other malaise: Secondary | ICD-10-CM

## 2017-06-27 DIAGNOSIS — G40909 Epilepsy, unspecified, not intractable, without status epilepticus: Secondary | ICD-10-CM | POA: Insufficient documentation

## 2017-06-27 DIAGNOSIS — G620 Drug-induced polyneuropathy: Secondary | ICD-10-CM

## 2017-06-27 DIAGNOSIS — R110 Nausea: Secondary | ICD-10-CM

## 2017-06-27 LAB — CBC WITH DIFFERENTIAL (CANCER CENTER ONLY)
BASOS ABS: 0 10*3/uL (ref 0.0–0.1)
Basophils Relative: 0 %
EOS PCT: 0 %
Eosinophils Absolute: 0 10*3/uL (ref 0.0–0.5)
HCT: 37.6 % (ref 34.8–46.6)
Hemoglobin: 12.7 g/dL (ref 11.6–15.9)
LYMPHS PCT: 23 %
Lymphs Abs: 0.9 10*3/uL (ref 0.9–3.3)
MCH: 33.5 pg (ref 25.1–34.0)
MCHC: 33.8 g/dL (ref 31.5–36.0)
MCV: 99.2 fL (ref 79.5–101.0)
MONO ABS: 0.1 10*3/uL (ref 0.1–0.9)
MONOS PCT: 1 %
Neutro Abs: 2.8 10*3/uL (ref 1.5–6.5)
Neutrophils Relative %: 76 %
PLATELETS: 155 10*3/uL (ref 145–400)
RBC: 3.79 MIL/uL (ref 3.70–5.45)
RDW: 15.1 % — AB (ref 11.2–14.5)
WBC Count: 3.7 10*3/uL — ABNORMAL LOW (ref 3.9–10.3)

## 2017-06-27 LAB — CMP (CANCER CENTER ONLY)
ALT: 16 U/L (ref 0–55)
AST: 21 U/L (ref 5–34)
Albumin: 4.1 g/dL (ref 3.5–5.0)
Alkaline Phosphatase: 88 U/L (ref 40–150)
Anion gap: 9 (ref 3–11)
BUN: 13 mg/dL (ref 7–26)
CO2: 25 mmol/L (ref 22–29)
Calcium: 9.8 mg/dL (ref 8.4–10.4)
Chloride: 104 mmol/L (ref 98–109)
Creatinine: 0.74 mg/dL (ref 0.60–1.10)
GFR, Est AFR Am: >60 mL/min (ref >60)
GFR, Estimated: >60 mL/min (ref >60)
Glucose, Bld: 145 mg/dL — ABNORMAL HIGH (ref 70–140)
Potassium: 3.9 mmol/L (ref 3.5–5.1)
Sodium: 138 mmol/L (ref 136–145)
Total Bilirubin: 0.3 mg/dL (ref 0.2–1.2)
Total Protein: 7.7 g/dL (ref 6.4–8.3)

## 2017-06-27 MED ORDER — HEPARIN SOD (PORK) LOCK FLUSH 100 UNIT/ML IV SOLN
500.0000 [IU] | Freq: Once | INTRAVENOUS | Status: AC | PRN
  Administered 2017-06-27: 500 [IU]
  Filled 2017-06-27: qty 5

## 2017-06-27 MED ORDER — DIPHENHYDRAMINE HCL 50 MG/ML IJ SOLN
12.5000 mg | Freq: Once | INTRAMUSCULAR | Status: AC
  Administered 2017-06-27: 12.5 mg via INTRAVENOUS

## 2017-06-27 MED ORDER — SODIUM CHLORIDE 0.9 % IV SOLN
Freq: Once | INTRAVENOUS | Status: AC
  Administered 2017-06-27: 12:00:00 via INTRAVENOUS
  Filled 2017-06-27: qty 5

## 2017-06-27 MED ORDER — FAMOTIDINE IN NACL 20-0.9 MG/50ML-% IV SOLN
INTRAVENOUS | Status: AC
  Filled 2017-06-27: qty 50

## 2017-06-27 MED ORDER — SODIUM CHLORIDE 0.9% FLUSH
10.0000 mL | INTRAVENOUS | Status: DC | PRN
  Administered 2017-06-27: 10 mL
  Filled 2017-06-27: qty 10

## 2017-06-27 MED ORDER — SODIUM CHLORIDE 0.9 % IV SOLN
131.2500 mg/m2 | Freq: Once | INTRAVENOUS | Status: AC
  Administered 2017-06-27: 234 mg via INTRAVENOUS
  Filled 2017-06-27: qty 39

## 2017-06-27 MED ORDER — SODIUM CHLORIDE 0.9 % IV SOLN
690.0000 mg | Freq: Once | INTRAVENOUS | Status: AC
  Administered 2017-06-27: 690 mg via INTRAVENOUS
  Filled 2017-06-27: qty 69

## 2017-06-27 MED ORDER — FAMOTIDINE IN NACL 20-0.9 MG/50ML-% IV SOLN
20.0000 mg | Freq: Once | INTRAVENOUS | Status: AC
  Administered 2017-06-27: 20 mg via INTRAVENOUS

## 2017-06-27 MED ORDER — PALONOSETRON HCL INJECTION 0.25 MG/5ML
INTRAVENOUS | Status: AC
  Filled 2017-06-27: qty 5

## 2017-06-27 MED ORDER — DIPHENHYDRAMINE HCL 50 MG/ML IJ SOLN
INTRAMUSCULAR | Status: AC
  Filled 2017-06-27: qty 1

## 2017-06-27 MED ORDER — PALONOSETRON HCL INJECTION 0.25 MG/5ML
0.2500 mg | Freq: Once | INTRAVENOUS | Status: AC
  Administered 2017-06-27: 0.25 mg via INTRAVENOUS

## 2017-06-27 NOTE — Progress Notes (Signed)
[facility] OFFICE PROGRESS NOTE  Patient Care Team: Wardell Honour, MD as PCP - General (Family Medicine)  ASSESSMENT & PLAN:  Endometrial cancer [facility]) She tolerated treatment very poorly with severe nausea, vomiting and dehydration I recommend consultation with advanced home care service for symptom management at home given her seizure disorder and significant debility from pre-existing chronic illness in addition to side effects from treatment We will proceed with treatment today without delay but in the future, I would like home care service to perform weekly assessment at home and to administer IV fluids or antiemetics as needed  Peripheral neuropathy due to chemotherapy [facility]) she has stable neuropathy I will continue on reduced dose of Taxol  Chemotherapy-induced nausea She could not afford scopolamine patch She has been taking round-the-clock Zofran and Compazine without success of controlling her severe nausea I recommend IV fluids along with IV antiemetics as needed at home and she agreed We also briefly discussed the use of dexamethasone twice daily for a few days after chemo for delay nausea or to use lorazepam as needed  Leukopenia due to antineoplastic chemotherapy [facility]) This is likely due to recent treatment. The patient denies recent history of fevers, cough, chills, diarrhea or dysuria. She is asymptomatic from the leukopenia. I will observe for now.     Orders Placed This Encounter  Procedures  . Ambulatory referral to Home Health    Referral Priority:   Routine    Referral Type:   Home Health Care    Referral Reason:   Specialty Services Required    Requested Specialty:   [place]    Number of Visits Requested:   1    INTERVAL HISTORY: Please see below for problem oriented charting. She returns with her daughter for cycle 4 of chemotherapy Peripheral neuropathy has been stable She denies recent infection She had profound severe  nausea and vomiting and inability to tolerate oral intake for many days after each cycle of chemotherapy Despite taking antiemetics around-the-clock, it was not helpful She denies constipation to nausea Fortunately, she has not lost any weight Her chronic anxiety has been stable She denies recent seizures  SUMMARY OF ONCOLOGIC HISTORY: Oncology History   Endometrioid with focal squamous  MSI stable     Endometrial cancer ([place])   02/20/2017 Pathology Results    Endometrium, biopsy - ENDOMETRIOID ADENOCARCINOMA, FIGO GRADE I, WITH SQUAMOUS DIFFERENTIATION. - SEE COMMENT      02/20/2017 Initial Diagnosis    Initially presented with PMB      03/26/2017 Pathology Results    1. Lymph node, sentinel, biopsy, right external iliac - LYMPH NODE, NEGATIVE FOR CARCINOMA (0/1). SEE NOTE 2. Lymph node, sentinel, biopsy, left external iliac - FOCUS OF METASTATIC CARCINOMA TO ONE LYMPH NODE, 0.3 MM (1/1). SEE NOTE 3. Uterus +/- tubes/ovaries, neoplastic, cervix - ENDOMETRIOID ADENOCARCINOMA WITH FOCAL SQUAMOUS DIFFERENTIATION, 3.5 CM, FIGO GRADE I, INVOLVING ANTERIOR AND POSTERIOR ENDOMETRIUM - TUMOR INVADES MORE THAN 90% OF MYOMETRIAL THICKNESS - LYMPHOVASCULAR INVASION IS IDENTIFIED - TUMOR INTRALUMINALLY EXTENDS INTO THE RIGHT FALLOPIAN TUBE; LEFT FALLOPIAN TUBE AND BILATERAL OVARIES ARE UNREMARKABLE - SEE ONCOLOGY TABLE Microscopic Comment 3. ONCOLOGY TABLE-UTERUS, CARCINOMA OR CARCINOSARCOMA  Specimen: Uterus, tubes, ovaries and cervix Procedure: Total hysterectomy and bilateral salpingo-oophorectomy Lymph node sampling performed: Yes Specimen integrity: Intact Maximum tumor size: 3.5 cm Histologic type: Endometrioid adenocarcinoma with focal squamous differentiation Grade: I Myometrial invasion: 1.4 cm where myometrium is 1.5 cm in thickness Uterine Serosa Involvement: Not identified  Cervical stromal involvement: Not identified Extent of involvement of other organs: Right fallopian  tube Lymph - vascular invasion: Present Lymph nodes: Examined: 2 Sentinel 0 Non-sentinel 2 Total Lymph nodes with metastasis: 1 Isolated tumor cells (< 0.2 mm): 0 TNM code: pT1b, pN1m FIGO Stage (based on pathologic findings, needs clinical correlation): IIIC      03/26/2017 Surgery    Pre-operative Diagnosis: endometrial cancer grade 1  Operation: Robotic-assisted laparoscopic total hysterectomy with bilateral salpingoophorectomy, SLN biopsy   Surgeon: RDonaciano Eva Operative Findings:  : normal 7cm uterus, tubes and ovaries, normal appearing nodes, no apparent extrauterine disease        04/11/2017 Cancer Staging    Staging form: Corpus Uteri - Carcinoma and Carcinosarcoma, AJCC 8th Edition - Pathologic: FIGO Stage IIIC1 (pT3a, pN1, cM0) - Signed by GHeath Lark MD on 04/11/2017      04/24/2017 Procedure    Successful placement of a right IJ approach Power Port with ultrasound and fluoroscopic guidance. The catheter is ready for use.      04/25/2017 -  Chemotherapy    The patient had carboplatin and Taxol for chemotherapy treatment.        06/06/2017 Genetic Testing    The Common Hereditary Cancer Panel offered by [facility] includes sequencing and/or deletion duplication testing of the following 47 genes: APC, ATM, AXIN2, BARD1, BMPR1A, BRCA1, BRCA2, BRIP1, CDH1, CDKN2A (p14ARF), CDKN2A (p16INK4a), CKD4, CHEK2, CTNNA1, DICER1, EPCAM (Deletion/duplication testing only), GREM1 (promoter region deletion/duplication testing only), KIT, MEN1, MLH1, MSH2, MSH3, MSH6, MUTYH, NBN, NF1, NHTL1, PALB2, PDGFRA, PMS2, POLD1, POLE, PTEN, RAD50, RAD51C, RAD51D, SDHB, SDHC, SDHD, SMAD4, SMARCA4. STK11, TP53, TSC1, TSC2, and VHL.  The following genes were evaluated for sequence changes only: SDHA and HOXB13 c.251G>A variant only.  Results: Negative, No pathogenic variants identified. The date of this test report is 06/06/2017.        REVIEW OF SYSTEMS:   Constitutional: Denies  fevers, chills or abnormal weight loss Eyes: Denies blurriness of vision Ears, nose, mouth, throat, and face: Denies mucositis or sore throat Respiratory: Denies cough, dyspnea or wheezes Cardiovascular: Denies palpitation, chest discomfort or lower extremity swelling Skin: Denies abnormal skin rashes Lymphatics: Denies new lymphadenopathy or easy bruising Neurological:Denies numbness, tingling or new weaknesses Behavioral/Psych: Mood is stable, no new changes  All other systems were reviewed with the patient and are negative.  I have reviewed the past medical history, past surgical history, social history and family history with the patient and they are unchanged from previous note.  ALLERGIES:  is allergic to aTeachers Insurance and Annuity Associationtartrate] and soma [carisoprodol].  MEDICATIONS:  Current Outpatient Medications  Medication Sig Dispense Refill  . citalopram (CELEXA) 20 MG tablet Take 1 tablet (20 mg total) by mouth daily. 90 tablet 0  . dexamethasone (DECADRON) 4 MG tablet Take 5 tabs the night before and 5 tabs the morning of chemotherapy, every 3 weeks, take with food 60 tablet 0  . diphenhydramine-acetaminophen (TYLENOL PM) 25-500 MG TABS tablet Take 1 tablet by mouth at bedtime.    . DULoxetine (CYMBALTA) 60 MG capsule Take 2 capsules (120 mg total) by mouth daily. (Patient taking differently: Take 60 mg by mouth 2 (two) times daily. ) 180 capsule 0  . levETIRAcetam (KEPPRA) 1000 MG tablet Take 1,500 mg by mouth 2 (two) times daily.    .Marland Kitchenlidocaine-prilocaine (EMLA) cream Apply to affected area once 30 g 3  . LORazepam (ATIVAN) 0.5 MG tablet Take 1 tablet (0.5 mg total) by mouth every  8 (eight) hours as needed for anxiety. 60 tablet 0  . Multiple Vitamin (MULTIVITAMIN) capsule Take 1 capsule by mouth daily.    . ondansetron (ZOFRAN) 8 MG tablet Take 1 tablet (8 mg total) by mouth every 8 (eight) hours as needed for refractory nausea / vomiting. Start on day 3 after chemo. 30 tablet 1  .  prochlorperazine (COMPAZINE) 10 MG tablet Take 1 tablet (10 mg total) by mouth every 6 (six) hours as needed (Nausea or vomiting). 30 tablet 1  . ranitidine (ZANTAC) 150 MG capsule Take 150 mg by mouth daily.    Marland Kitchen scopolamine (TRANSDERM-SCOP) 1 MG/3DAYS Place 1 patch (1.5 mg total) onto the skin every 3 (three) days. 10 patch 12   No current facility-administered medications for this visit.    Facility-Administered Medications Ordered in Other Visits  Medication Dose Route Frequency Provider Last Rate Last Dose  . CARBOplatin (PARAPLATIN) 690 mg in sodium chloride 0.9 % 250 mL chemo infusion  690 mg Intravenous Once Alvy Bimler, Sheronica Corey, MD      . heparin lock flush 100 unit/mL  500 Units Intracatheter Once PRN Alvy Bimler, Aireona Torelli, MD      . PACLitaxel (TAXOL) 234 mg in sodium chloride 0.9 % 250 mL chemo infusion (> '80mg'$ /m2)  131.25 mg/m2 (Treatment Plan Recorded) Intravenous Once Heath Lark, MD 96 mL/hr at 06/27/17 1250 234 mg at 06/27/17 1250  . sodium chloride flush (NS) 0.9 % injection 10 mL  10 mL Intracatheter PRN Alvy Bimler, Lawana Hartzell, MD        PHYSICAL EXAMINATION: ECOG PERFORMANCE STATUS: 2 - Symptomatic, <50% confined to bed  Vitals:   06/27/17 1015  BP: (!) 130/97  Pulse: (!) 107  Resp: 18  Temp: 98.5 F (36.9 C)  SpO2: 98%   Filed Weights   06/27/17 1015  Weight: 162 lb 9.6 oz (73.8 kg)    GENERAL:alert, no distress and comfortable SKIN: skin color, texture, turgor are normal, no rashes or significant lesions EYES: normal, Conjunctiva are pink and non-injected, sclera clear OROPHARYNX:no exudate, no erythema and lips, buccal mucosa, and tongue normal  NECK: supple, thyroid normal size, non-tender, without nodularity LYMPH:  no palpable lymphadenopathy in the cervical, axillary or inguinal LUNGS: clear to auscultation and percussion with normal breathing effort HEART: regular rate & rhythm and no murmurs and no lower extremity edema ABDOMEN:abdomen soft, non-tender and normal bowel  sounds Musculoskeletal:no cyanosis of digits and no clubbing  NEURO: alert & oriented x 3 with fluent speech, no focal motor/sensory deficits  LABORATORY DATA:  I have reviewed the data as listed    Component Value Date/Time   NA 138 06/27/2017 0902   NA 140 11/07/2016 1551   K 3.9 06/27/2017 0902   CL 104 06/27/2017 0902   CO2 25 06/27/2017 0902   GLUCOSE 145 (H) 06/27/2017 0902   BUN 13 06/27/2017 0902   BUN 11 11/07/2016 1551   CREATININE 0.74 06/27/2017 0902   CREATININE 0.79 12/02/2015 1146   CALCIUM 9.8 06/27/2017 0902   PROT 7.7 06/27/2017 0902   PROT 6.7 11/07/2016 1551   ALBUMIN 4.1 06/27/2017 0902   ALBUMIN 4.4 11/07/2016 1551   AST 21 06/27/2017 0902   ALT 16 06/27/2017 0902   ALKPHOS 88 06/27/2017 0902   BILITOT 0.3 06/27/2017 0902   GFRNONAA >60 06/27/2017 0902   GFRAA >60 06/27/2017 0902    No results found for: SPEP, UPEP  Lab Results  Component Value Date   WBC 3.7 (L) 06/27/2017   NEUTROABS 2.8 06/27/2017  HGB 12.7 06/27/2017   HCT 37.6 06/27/2017   MCV 99.2 06/27/2017   PLT 155 06/27/2017      Chemistry      Component Value Date/Time   NA 138 06/27/2017 0902   NA 140 11/07/2016 1551   K 3.9 06/27/2017 0902   CL 104 06/27/2017 0902   CO2 25 06/27/2017 0902   BUN 13 06/27/2017 0902   BUN 11 11/07/2016 1551   CREATININE 0.74 06/27/2017 0902   CREATININE 0.79 12/02/2015 1146      Component Value Date/Time   CALCIUM 9.8 06/27/2017 0902   ALKPHOS 88 06/27/2017 0902   AST 21 06/27/2017 0902   ALT 16 06/27/2017 0902   BILITOT 0.3 06/27/2017 0902       All questions were answered. The patient knows to call the clinic with any problems, questions or concerns. No barriers to learning was detected.  I spent 30 minutes counseling the patient face to face. The total time spent in the appointment was 40 minutes and more than 50% was on counseling and review of test results  Heath Lark, MD 06/27/2017 1:16 PM

## 2017-06-27 NOTE — Progress Notes (Signed)
Patient's daughter states [facility] RN asked that [facility] RN leave port accessed as patient is to receive IV fluids at home on 06/28/17. Port-A-Cath care education provided. Patient and daughter verbalized understanding.

## 2017-06-27 NOTE — Progress Notes (Signed)
Okay to treat today with heart rate of 103, per Dr. Alvy Bimler.

## 2017-06-27 NOTE — Telephone Encounter (Signed)
Gave patient avs and calendar of upcoming July appointments.  °

## 2017-06-27 NOTE — Assessment & Plan Note (Signed)
she has stable neuropathy I will continue on reduced dose of Taxol

## 2017-06-27 NOTE — Assessment & Plan Note (Signed)
She tolerated treatment very poorly with severe nausea, vomiting and dehydration I recommend consultation with advanced home care service for symptom management at home given her seizure disorder and significant debility from pre-existing chronic illness in addition to side effects from treatment We will proceed with treatment today without delay but in the future, I would like home care service to perform weekly assessment at home and to administer IV fluids or antiemetics as needed

## 2017-06-27 NOTE — Assessment & Plan Note (Signed)
She could not afford scopolamine patch She has been taking round-the-clock Zofran and Compazine without success of controlling her severe nausea I recommend IV fluids along with IV antiemetics as needed at home and she agreed We also briefly discussed the use of dexamethasone twice daily for a few days after chemo for delay nausea or to use lorazepam as needed

## 2017-06-27 NOTE — Patient Instructions (Signed)
[facility] Discharge Instructions for Patients Receiving Chemotherapy  Today you received the following chemotherapy agents: Paclitaxel, Carboplatin  To help prevent nausea and vomiting after your treatment, we encourage you to take your nausea medication as directed.   If you develop nausea and vomiting that is not controlled by your nausea medication, call the clinic.   BELOW ARE SYMPTOMS THAT SHOULD BE REPORTED IMMEDIATELY:  *FEVER GREATER THAN 100.5 F  *CHILLS WITH OR WITHOUT FEVER  NAUSEA AND VOMITING THAT IS NOT CONTROLLED WITH YOUR NAUSEA MEDICATION  *UNUSUAL SHORTNESS OF BREATH  *UNUSUAL BRUISING OR BLEEDING  TENDERNESS IN MOUTH AND THROAT WITH OR WITHOUT PRESENCE OF ULCERS  *URINARY PROBLEMS  *BOWEL PROBLEMS  UNUSUAL RASH Items with * indicate a potential emergency and should be followed up as soon as possible.  Feel free to call the clinic should you have any questions or concerns. The clinic phone number is [phone].  Please show the CHEMO ALERT CARD at check-in to the Emergency Department and triage nurse.   

## 2017-06-27 NOTE — Assessment & Plan Note (Signed)
This is likely due to recent treatment. The patient denies recent history of fevers, cough, chills, diarrhea or dysuria. She is asymptomatic from the leukopenia. I will observe for now.    

## 2017-06-27 NOTE — Progress Notes (Signed)
62 year-old female diagnosed with Endometrial Cancer. She is a patient of Dr. Alvy Bimler.  PMH includes Seizures, GERD, Depression and Anxiety.  Medications include Celexa, Decadron, Cymbalta, Ativan, MVI, Zofran, Compazine, Zantac.  Labs reviewed.  Height: 4'11". Weight: 162.6 pounds.  UBW: 170 pounds. BMI: 31.39  Patient reports nausea which is ongoing. Reports she was not able to get Scopolamine because of the cost. Patient stays with her daughter who helps with her care. She did not really want to speak with me and asked that I speak with her daughter.  Nutrition Diagnosis: Unintended weight loss related to endometrial cancer and associated treatments as evidenced by 7.4 pounds weight loss from UBW.  Intervention: Educated patient to eat small, frequent meals and snacks with adequate calories and protein to achieve weight maintenance. Reviewed high protein foods and encouraged consumption 3 times daily. Educated on strategies for improving nausea. Questions answered and teach back method used. Fact sheets and contact information provided.  Monitoring, evaluation, Goals: Patient will increase calories and protein to maintain weight throughout treatment.  Next Visit:To be scheduled as needed. To be scheduled as needed.  **Disclaimer: This note was dictated with voice recognition software. Similar sounding words can inadvertently be transcribed and this note may contain transcription errors which may not have been corrected upon publication of note.**

## 2017-06-28 ENCOUNTER — Telehealth: Payer: Self-pay

## 2017-06-28 DIAGNOSIS — D701 Agranulocytosis secondary to cancer chemotherapy: Secondary | ICD-10-CM | POA: Diagnosis not present

## 2017-06-28 DIAGNOSIS — Z452 Encounter for adjustment and management of vascular access device: Secondary | ICD-10-CM | POA: Diagnosis not present

## 2017-06-28 DIAGNOSIS — E860 Dehydration: Secondary | ICD-10-CM | POA: Diagnosis not present

## 2017-06-28 DIAGNOSIS — C541 Malignant neoplasm of endometrium: Secondary | ICD-10-CM | POA: Diagnosis not present

## 2017-06-28 NOTE — Telephone Encounter (Signed)
Morey Hummingbird, nurse with [facility] called and left a message. She went out to see patient today. She needs orders and asked if it was okay apply Biopatch on porta cath with dressing change.  Called back and left message. Ask her to fax over orders, fax number given. Gave verbal order to apply the Biopatch on porta cath site with dressing. Instructed to call for further questions.

## 2017-07-03 ENCOUNTER — Telehealth: Payer: Self-pay | Admitting: *Deleted

## 2017-07-03 ENCOUNTER — Encounter: Payer: Self-pay | Admitting: Hematology and Oncology

## 2017-07-03 NOTE — Telephone Encounter (Signed)
Called patient regarding My Chart message. States she has had 4-7 loose stools per day since Saturday- soft stools. Has not tried lomotil yet.  Will take lomotil today. Received IVF yesterday from [facility] is coming today to give more IVF.

## 2017-07-05 DIAGNOSIS — Z452 Encounter for adjustment and management of vascular access device: Secondary | ICD-10-CM | POA: Diagnosis not present

## 2017-07-05 DIAGNOSIS — E860 Dehydration: Secondary | ICD-10-CM | POA: Diagnosis not present

## 2017-07-05 DIAGNOSIS — D701 Agranulocytosis secondary to cancer chemotherapy: Secondary | ICD-10-CM | POA: Diagnosis not present

## 2017-07-05 DIAGNOSIS — C541 Malignant neoplasm of endometrium: Secondary | ICD-10-CM | POA: Diagnosis not present

## 2017-07-07 ENCOUNTER — Other Ambulatory Visit: Payer: Self-pay | Admitting: Family Medicine

## 2017-07-07 DIAGNOSIS — M797 Fibromyalgia: Secondary | ICD-10-CM

## 2017-07-07 DIAGNOSIS — Z452 Encounter for adjustment and management of vascular access device: Secondary | ICD-10-CM | POA: Diagnosis not present

## 2017-07-07 DIAGNOSIS — F330 Major depressive disorder, recurrent, mild: Secondary | ICD-10-CM

## 2017-07-09 DIAGNOSIS — Z452 Encounter for adjustment and management of vascular access device: Secondary | ICD-10-CM | POA: Diagnosis not present

## 2017-07-12 DIAGNOSIS — C541 Malignant neoplasm of endometrium: Secondary | ICD-10-CM | POA: Diagnosis not present

## 2017-07-17 DIAGNOSIS — C541 Malignant neoplasm of endometrium: Secondary | ICD-10-CM | POA: Diagnosis not present

## 2017-07-19 ENCOUNTER — Inpatient Hospital Stay: Payer: BLUE CROSS/BLUE SHIELD

## 2017-07-19 ENCOUNTER — Encounter: Payer: Self-pay | Admitting: Hematology and Oncology

## 2017-07-19 ENCOUNTER — Inpatient Hospital Stay: Payer: BLUE CROSS/BLUE SHIELD | Attending: Gynecologic Oncology

## 2017-07-19 ENCOUNTER — Inpatient Hospital Stay (HOSPITAL_BASED_OUTPATIENT_CLINIC_OR_DEPARTMENT_OTHER): Payer: BLUE CROSS/BLUE SHIELD | Admitting: Hematology and Oncology

## 2017-07-19 DIAGNOSIS — Z5111 Encounter for antineoplastic chemotherapy: Secondary | ICD-10-CM | POA: Diagnosis not present

## 2017-07-19 DIAGNOSIS — C541 Malignant neoplasm of endometrium: Secondary | ICD-10-CM

## 2017-07-19 DIAGNOSIS — R110 Nausea: Secondary | ICD-10-CM | POA: Diagnosis not present

## 2017-07-19 DIAGNOSIS — E860 Dehydration: Secondary | ICD-10-CM | POA: Insufficient documentation

## 2017-07-19 DIAGNOSIS — D61818 Other pancytopenia: Secondary | ICD-10-CM | POA: Insufficient documentation

## 2017-07-19 DIAGNOSIS — R55 Syncope and collapse: Secondary | ICD-10-CM | POA: Insufficient documentation

## 2017-07-19 DIAGNOSIS — G620 Drug-induced polyneuropathy: Secondary | ICD-10-CM | POA: Insufficient documentation

## 2017-07-19 DIAGNOSIS — D701 Agranulocytosis secondary to cancer chemotherapy: Secondary | ICD-10-CM

## 2017-07-19 DIAGNOSIS — T451X5A Adverse effect of antineoplastic and immunosuppressive drugs, initial encounter: Secondary | ICD-10-CM

## 2017-07-19 DIAGNOSIS — Z79899 Other long term (current) drug therapy: Secondary | ICD-10-CM | POA: Diagnosis not present

## 2017-07-19 LAB — COMPREHENSIVE METABOLIC PANEL
ALT: 17 U/L (ref 0–44)
ANION GAP: 7 (ref 5–15)
AST: 23 U/L (ref 15–41)
Albumin: 3.8 g/dL (ref 3.5–5.0)
Alkaline Phosphatase: 71 U/L (ref 38–126)
BUN: 11 mg/dL (ref 6–20)
CHLORIDE: 102 mmol/L (ref 98–111)
CO2: 30 mmol/L (ref 22–32)
Calcium: 8.9 mg/dL (ref 8.9–10.3)
Creatinine, Ser: 0.79 mg/dL (ref 0.44–1.00)
Glucose, Bld: 104 mg/dL — ABNORMAL HIGH (ref 70–99)
POTASSIUM: 3.9 mmol/L (ref 3.5–5.1)
SODIUM: 139 mmol/L (ref 135–145)
Total Bilirubin: 0.4 mg/dL (ref 0.3–1.2)
Total Protein: 6.8 g/dL (ref 6.5–8.1)

## 2017-07-19 LAB — CBC WITH DIFFERENTIAL (CANCER CENTER ONLY)
Basophils Absolute: 0 10*3/uL (ref 0.0–0.1)
Basophils Relative: 0 %
EOS ABS: 0 10*3/uL (ref 0.0–0.5)
Eosinophils Relative: 0 %
HEMATOCRIT: 34.6 % — AB (ref 34.8–46.6)
HEMOGLOBIN: 11.8 g/dL (ref 11.6–15.9)
LYMPHS ABS: 1.7 10*3/uL (ref 0.9–3.3)
LYMPHS PCT: 46 %
MCH: 35.5 pg — AB (ref 25.1–34.0)
MCHC: 34.2 g/dL (ref 31.5–36.0)
MCV: 103.8 fL — AB (ref 79.5–101.0)
MONOS PCT: 12 %
Monocytes Absolute: 0.4 10*3/uL (ref 0.1–0.9)
NEUTROS PCT: 42 %
Neutro Abs: 1.5 10*3/uL (ref 1.5–6.5)
Platelet Count: 148 10*3/uL (ref 145–400)
RBC: 3.33 MIL/uL — ABNORMAL LOW (ref 3.70–5.45)
RDW: 17.8 % — ABNORMAL HIGH (ref 11.2–14.5)
WBC Count: 3.6 10*3/uL — ABNORMAL LOW (ref 3.9–10.3)

## 2017-07-19 MED ORDER — SODIUM CHLORIDE 0.9% FLUSH
10.0000 mL | Freq: Once | INTRAVENOUS | Status: AC
  Administered 2017-07-19: 10 mL
  Filled 2017-07-19: qty 10

## 2017-07-19 MED ORDER — PALONOSETRON HCL INJECTION 0.25 MG/5ML
INTRAVENOUS | Status: AC
  Filled 2017-07-19: qty 5

## 2017-07-19 MED ORDER — SODIUM CHLORIDE 0.9 % IV SOLN
Freq: Once | INTRAVENOUS | Status: AC
  Administered 2017-07-19: 10:00:00 via INTRAVENOUS

## 2017-07-19 MED ORDER — HEPARIN SOD (PORK) LOCK FLUSH 100 UNIT/ML IV SOLN
500.0000 [IU] | Freq: Once | INTRAVENOUS | Status: DC
  Filled 2017-07-19: qty 5

## 2017-07-19 MED ORDER — SODIUM CHLORIDE 0.9 % IV SOLN
Freq: Once | INTRAVENOUS | Status: AC
  Administered 2017-07-19: 11:00:00 via INTRAVENOUS
  Filled 2017-07-19: qty 5

## 2017-07-19 MED ORDER — FAMOTIDINE IN NACL 20-0.9 MG/50ML-% IV SOLN
20.0000 mg | Freq: Once | INTRAVENOUS | Status: AC
  Administered 2017-07-19: 20 mg via INTRAVENOUS

## 2017-07-19 MED ORDER — SODIUM CHLORIDE 0.9 % IV SOLN
694.8000 mg | Freq: Once | INTRAVENOUS | Status: AC
  Administered 2017-07-19: 690 mg via INTRAVENOUS
  Filled 2017-07-19: qty 69

## 2017-07-19 MED ORDER — HEPARIN SOD (PORK) LOCK FLUSH 100 UNIT/ML IV SOLN
500.0000 [IU] | Freq: Once | INTRAVENOUS | Status: AC | PRN
  Administered 2017-07-19: 500 [IU]
  Filled 2017-07-19: qty 5

## 2017-07-19 MED ORDER — SODIUM CHLORIDE 0.9 % IV SOLN
105.0000 mg/m2 | Freq: Once | INTRAVENOUS | Status: AC
  Administered 2017-07-19: 186 mg via INTRAVENOUS
  Filled 2017-07-19: qty 31

## 2017-07-19 MED ORDER — PALONOSETRON HCL INJECTION 0.25 MG/5ML
0.2500 mg | Freq: Once | INTRAVENOUS | Status: AC
  Administered 2017-07-19: 0.25 mg via INTRAVENOUS

## 2017-07-19 MED ORDER — FAMOTIDINE IN NACL 20-0.9 MG/50ML-% IV SOLN
INTRAVENOUS | Status: AC
  Filled 2017-07-19: qty 50

## 2017-07-19 MED ORDER — SODIUM CHLORIDE 0.9% FLUSH
10.0000 mL | INTRAVENOUS | Status: DC | PRN
  Administered 2017-07-19: 10 mL
  Filled 2017-07-19: qty 10

## 2017-07-19 MED ORDER — SODIUM CHLORIDE 0.9% FLUSH
10.0000 mL | Freq: Once | INTRAVENOUS | Status: DC
Start: 2017-07-19 — End: 2017-07-19
  Filled 2017-07-19: qty 10

## 2017-07-19 MED ORDER — DIPHENHYDRAMINE HCL 50 MG/ML IJ SOLN
INTRAMUSCULAR | Status: AC
  Filled 2017-07-19: qty 1

## 2017-07-19 MED ORDER — DIPHENHYDRAMINE HCL 50 MG/ML IJ SOLN
12.5000 mg | Freq: Once | INTRAMUSCULAR | Status: AC
  Administered 2017-07-19: 12.5 mg via INTRAVENOUS

## 2017-07-19 NOTE — Progress Notes (Signed)
Pt verbalized that decadron steroid not taken at home last night or this morning. Dr. Alvy Bimler made aware. No additional decadron at this time. Pt encouraged, per MD, to take decadron as prescribed at home.

## 2017-07-19 NOTE — Progress Notes (Signed)
[place] OFFICE PROGRESS NOTE  Patient Care Team: Wardell Honour, MD as PCP - General (Family Medicine)  ASSESSMENT & PLAN:  Endometrial cancer [facility]) She tolerated treatment very poorly with severe nausea, vomiting and dehydration She felt that the advanced home care service was helpful for symptom management and IV fluids administration at home.  We will continue the same. She will complete final treatment at the end of the month.  Peripheral neuropathy due to chemotherapy [facility]) She felt that her neuropathy is a little worse I plan to reduce the dose of Taxol a little bit further  Leukopenia due to antineoplastic chemotherapy [facility]) This is likely due to recent treatment. The patient denies recent history of fevers, cough, chills, diarrhea or dysuria. She is asymptomatic from the leukopenia. I will observe for now.    Chemotherapy-induced nausea Nausea control is stable with additional IV fluid support at home.  We will continue the same   No orders of the defined types were placed in this encounter.   INTERVAL HISTORY: Please see below for problem oriented charting. She returns with her daughter for further follow-up The patient had recent problem with venous access at home. She has been receiving intermittent IV fluids which seems to help alleviate some of her nausea She had one episode of seizure.  her neurologist increase the dose of her Keppra recently She felt that neuropathy might be a little worse She denies fall or painful neuropathy No recent infection, fever or chills  SUMMARY OF ONCOLOGIC HISTORY: Oncology History   Endometrioid with focal squamous  MSI stable     Endometrial cancer ([place])   02/20/2017 Pathology Results    Endometrium, biopsy - ENDOMETRIOID ADENOCARCINOMA, FIGO GRADE I, WITH SQUAMOUS DIFFERENTIATION. - SEE COMMENT      02/20/2017 Initial Diagnosis    Initially presented with PMB      03/26/2017 Pathology Results    1.  Lymph node, sentinel, biopsy, right external iliac - LYMPH NODE, NEGATIVE FOR CARCINOMA (0/1). SEE NOTE 2. Lymph node, sentinel, biopsy, left external iliac - FOCUS OF METASTATIC CARCINOMA TO ONE LYMPH NODE, 0.3 MM (1/1). SEE NOTE 3. Uterus +/- tubes/ovaries, neoplastic, cervix - ENDOMETRIOID ADENOCARCINOMA WITH FOCAL SQUAMOUS DIFFERENTIATION, 3.5 CM, FIGO GRADE I, INVOLVING ANTERIOR AND POSTERIOR ENDOMETRIUM - TUMOR INVADES MORE THAN 90% OF MYOMETRIAL THICKNESS - LYMPHOVASCULAR INVASION IS IDENTIFIED - TUMOR INTRALUMINALLY EXTENDS INTO THE RIGHT FALLOPIAN TUBE; LEFT FALLOPIAN TUBE AND BILATERAL OVARIES ARE UNREMARKABLE - SEE ONCOLOGY TABLE Microscopic Comment 3. ONCOLOGY TABLE-UTERUS, CARCINOMA OR CARCINOSARCOMA  Specimen: Uterus, tubes, ovaries and cervix Procedure: Total hysterectomy and bilateral salpingo-oophorectomy Lymph node sampling performed: Yes Specimen integrity: Intact Maximum tumor size: 3.5 cm Histologic type: Endometrioid adenocarcinoma with focal squamous differentiation Grade: I Myometrial invasion: 1.4 cm where myometrium is 1.5 cm in thickness Uterine Serosa Involvement: Not identified Cervical stromal involvement: Not identified Extent of involvement of other organs: Right fallopian tube Lymph - vascular invasion: Present Lymph nodes: Examined: 2 Sentinel 0 Non-sentinel 2 Total Lymph nodes with metastasis: 1 Isolated tumor cells (< 0.2 mm): 0 TNM code: pT1b, pN16m FIGO Stage (based on pathologic findings, needs clinical correlation): IIIC      03/26/2017 Surgery    Pre-operative Diagnosis: endometrial cancer grade 1  Operation: Robotic-assisted laparoscopic total hysterectomy with bilateral salpingoophorectomy, SLN biopsy   Surgeon: RDonaciano Eva Operative Findings:  : normal 7cm uterus, tubes and ovaries, normal appearing nodes, no apparent extrauterine disease        04/11/2017  Cancer Staging    Staging form: Corpus Uteri - Carcinoma  and Carcinosarcoma, AJCC 8th Edition - Pathologic: FIGO Stage IIIC1 (pT3a, pN1, cM0) - Signed by Heath Lark, MD on 04/11/2017      04/24/2017 Procedure    Successful placement of a right IJ approach Power Port with ultrasound and fluoroscopic guidance. The catheter is ready for use.      04/25/2017 -  Chemotherapy    The patient had carboplatin and Taxol for chemotherapy treatment.        06/06/2017 Genetic Testing    The Common Hereditary Cancer Panel offered by [facility] includes sequencing and/or deletion duplication testing of the following 47 genes: APC, ATM, AXIN2, BARD1, BMPR1A, BRCA1, BRCA2, BRIP1, CDH1, CDKN2A (p14ARF), CDKN2A (p16INK4a), CKD4, CHEK2, CTNNA1, DICER1, EPCAM (Deletion/duplication testing only), GREM1 (promoter region deletion/duplication testing only), KIT, MEN1, MLH1, MSH2, MSH3, MSH6, MUTYH, NBN, NF1, NHTL1, PALB2, PDGFRA, PMS2, POLD1, POLE, PTEN, RAD50, RAD51C, RAD51D, SDHB, SDHC, SDHD, SMAD4, SMARCA4. STK11, TP53, TSC1, TSC2, and VHL.  The following genes were evaluated for sequence changes only: SDHA and HOXB13 c.251G>A variant only.  Results: Negative, No pathogenic variants identified. The date of this test report is 06/06/2017.        REVIEW OF SYSTEMS:   Constitutional: Denies fevers, chills or abnormal weight loss Eyes: Denies blurriness of vision Ears, nose, mouth, throat, and face: Denies mucositis or sore throat Respiratory: Denies cough, dyspnea or wheezes Cardiovascular: Denies palpitation, chest discomfort or lower extremity swelling Skin: Denies abnormal skin rashes Lymphatics: Denies new lymphadenopathy or easy bruising Behavioral/Psych: Mood is stable, no new changes  All other systems were reviewed with the patient and are negative.  I have reviewed the past medical history, past surgical history, social history and family history with the patient and they are unchanged from previous note.  ALLERGIES:  is allergic to [employer] tartrate]  and soma [carisoprodol].  MEDICATIONS:  Current Outpatient Medications  Medication Sig Dispense Refill  . citalopram (CELEXA) 20 MG tablet Take 1 tablet (20 mg total) by mouth daily. 90 tablet 0  . dexamethasone (DECADRON) 4 MG tablet Take 5 tabs the night before and 5 tabs the morning of chemotherapy, every 3 weeks, take with food 60 tablet 0  . diphenhydramine-acetaminophen (TYLENOL PM) 25-500 MG TABS tablet Take 1 tablet by mouth at bedtime.    . DULoxetine (CYMBALTA) 60 MG capsule TAKE 2 CAPSULES BY MOUTH ONCE DAILY 180 capsule 0  . levETIRAcetam (KEPPRA) 1000 MG tablet Take 2,000 mg by mouth 2 (two) times daily.    Marland Kitchen lidocaine-prilocaine (EMLA) cream Apply to affected area once 30 g 3  . LORazepam (ATIVAN) 0.5 MG tablet Take 1 tablet (0.5 mg total) by mouth every 8 (eight) hours as needed for anxiety. 60 tablet 0  . Multiple Vitamin (MULTIVITAMIN) capsule Take 1 capsule by mouth daily.    . ondansetron (ZOFRAN) 8 MG tablet Take 1 tablet (8 mg total) by mouth every 8 (eight) hours as needed for refractory nausea / vomiting. Start on day 3 after chemo. 30 tablet 1  . prochlorperazine (COMPAZINE) 10 MG tablet Take 1 tablet (10 mg total) by mouth every 6 (six) hours as needed (Nausea or vomiting). 30 tablet 1  . ranitidine (ZANTAC) 150 MG capsule Take 150 mg by mouth daily.    Marland Kitchen scopolamine (TRANSDERM-SCOP) 1 MG/3DAYS Place 1 patch (1.5 mg total) onto the skin every 3 (three) days. 10 patch 12   No current facility-administered medications for this visit.  Facility-Administered Medications Ordered in Other Visits  Medication Dose Route Frequency Provider Last Rate Last Dose  . CARBOplatin (PARAPLATIN) 690 mg in sodium chloride 0.9 % 250 mL chemo infusion  690 mg Intravenous Once Alvy Bimler, Sabre Romberger, MD      . heparin lock flush 100 unit/mL  500 Units Intracatheter Once PRN Alvy Bimler, Tiyana Galla, MD      . PACLitaxel (TAXOL) 186 mg in sodium chloride 0.9 % 250 mL chemo infusion (> 70m/m2)  105 mg/m2  (Treatment Plan Recorded) Intravenous Once Chaniyah Jahr, MD      . sodium chloride flush (NS) 0.9 % injection 10 mL  10 mL Intracatheter PRN GAlvy Bimler Lasya Vetter, MD        PHYSICAL EXAMINATION: ECOG PERFORMANCE STATUS: 1 - Symptomatic but completely ambulatory  Vitals:   07/19/17 0904  BP: (!) 120/91  Pulse: 95  Resp: 18  Temp: 98.4 F (36.9 C)  SpO2: 98%   Filed Weights   07/19/17 0904  Weight: 163 lb 14.4 oz (74.3 kg)    GENERAL:alert, no distress and comfortable SKIN: skin color, texture, turgor are normal, no rashes or significant lesions EYES: normal, Conjunctiva are pink and non-injected, sclera clear OROPHARYNX:no exudate, no erythema and lips, buccal mucosa, and tongue normal  NECK: supple, thyroid normal size, non-tender, without nodularity LYMPH:  no palpable lymphadenopathy in the cervical, axillary or inguinal LUNGS: clear to auscultation and percussion with normal breathing effort HEART: regular rate & rhythm and no murmurs and no lower extremity edema ABDOMEN:abdomen soft, non-tender and normal bowel sounds Musculoskeletal:no cyanosis of digits and no clubbing  NEURO: alert & oriented x 3 with fluent speech, no focal motor/sensory deficits  LABORATORY DATA:  I have reviewed the data as listed    Component Value Date/Time   NA 139 07/19/2017 0844   NA 140 11/07/2016 1551   K 3.9 07/19/2017 0844   CL 102 07/19/2017 0844   CO2 30 07/19/2017 0844   GLUCOSE 104 (H) 07/19/2017 0844   BUN 11 07/19/2017 0844   BUN 11 11/07/2016 1551   CREATININE 0.79 07/19/2017 0844   CREATININE 0.74 06/27/2017 0902   CREATININE 0.79 12/02/2015 1146   CALCIUM 8.9 07/19/2017 0844   PROT 6.8 07/19/2017 0844   PROT 6.7 11/07/2016 1551   ALBUMIN 3.8 07/19/2017 0844   ALBUMIN 4.4 11/07/2016 1551   AST 23 07/19/2017 0844   AST 21 06/27/2017 0902   ALT 17 07/19/2017 0844   ALT 16 06/27/2017 0902   ALKPHOS 71 07/19/2017 0844   BILITOT 0.4 07/19/2017 0844   BILITOT 0.3 06/27/2017 0902    GFRNONAA >60 07/19/2017 0844   GFRNONAA >60 06/27/2017 0902   GFRAA >60 07/19/2017 0844   GFRAA >60 06/27/2017 0902    No results found for: SPEP, UPEP  Lab Results  Component Value Date   WBC 3.6 (L) 07/19/2017   NEUTROABS 1.5 07/19/2017   HGB 11.8 07/19/2017   HCT 34.6 (L) 07/19/2017   MCV 103.8 (H) 07/19/2017   PLT 148 07/19/2017      Chemistry      Component Value Date/Time   NA 139 07/19/2017 0844   NA 140 11/07/2016 1551   K 3.9 07/19/2017 0844   CL 102 07/19/2017 0844   CO2 30 07/19/2017 0844   BUN 11 07/19/2017 0844   BUN 11 11/07/2016 1551   CREATININE 0.79 07/19/2017 0844   CREATININE 0.74 06/27/2017 0902   CREATININE 0.79 12/02/2015 1146      Component Value Date/Time   CALCIUM  8.9 07/19/2017 0844   ALKPHOS 71 07/19/2017 0844   AST 23 07/19/2017 0844   AST 21 06/27/2017 0902   ALT 17 07/19/2017 0844   ALT 16 06/27/2017 0902   BILITOT 0.4 07/19/2017 0844   BILITOT 0.3 06/27/2017 0902       All questions were answered. The patient knows to call the clinic with any problems, questions or concerns. No barriers to learning was detected.  I spent 20 minutes counseling the patient face to face. The total time spent in the appointment was 25 minutes and more than 50% was on counseling and review of test results  Heath Lark, MD 07/19/2017 11:41 AM

## 2017-07-19 NOTE — Patient Instructions (Addendum)
[facility] Discharge Instructions for Patients Receiving Chemotherapy  Today you received the following chemotherapy agents paclitaxel (Taxol) and carboplatin (Paraplatin)  To help prevent nausea and vomiting after your treatment, we encourage you to take your nausea medication as directed   If you develop nausea and vomiting that is not controlled by your nausea medication, call the clinic.   BELOW ARE SYMPTOMS THAT SHOULD BE REPORTED IMMEDIATELY:  *FEVER GREATER THAN 100.5 F  *CHILLS WITH OR WITHOUT FEVER  NAUSEA AND VOMITING THAT IS NOT CONTROLLED WITH YOUR NAUSEA MEDICATION  *UNUSUAL SHORTNESS OF BREATH  *UNUSUAL BRUISING OR BLEEDING  TENDERNESS IN MOUTH AND THROAT WITH OR WITHOUT PRESENCE OF ULCERS  *URINARY PROBLEMS  *BOWEL PROBLEMS  UNUSUAL RASH Items with * indicate a potential emergency and should be followed up as soon as possible.  Feel free to call the clinic should you have any questions or concerns. The clinic phone number is [phone].  Please show the CHEMO ALERT CARD at check-in to the Emergency Department and triage nurse.   

## 2017-07-19 NOTE — Assessment & Plan Note (Signed)
She tolerated treatment very poorly with severe nausea, vomiting and dehydration She felt that the advanced home care service was helpful for symptom management and IV fluids administration at home.  We will continue the same. She will complete final treatment at the end of the month.

## 2017-07-19 NOTE — Assessment & Plan Note (Signed)
She felt that her neuropathy is a little worse I plan to reduce the dose of Taxol a little bit further

## 2017-07-19 NOTE — Assessment & Plan Note (Signed)
This is likely due to recent treatment. The patient denies recent history of fevers, cough, chills, diarrhea or dysuria. She is asymptomatic from the leukopenia. I will observe for now.    

## 2017-07-19 NOTE — Assessment & Plan Note (Signed)
Nausea control is stable with additional IV fluid support at home.  We will continue the same

## 2017-07-22 ENCOUNTER — Ambulatory Visit
Admission: RE | Admit: 2017-07-22 | Discharge: 2017-07-22 | Disposition: A | Discharge status: Home / Self Care | Payer: BLUE CROSS/BLUE SHIELD | Source: Ambulatory Visit | Attending: Radiation Oncology | Admitting: Radiation Oncology

## 2017-07-22 ENCOUNTER — Encounter: Payer: Self-pay | Admitting: Radiation Oncology

## 2017-07-22 ENCOUNTER — Other Ambulatory Visit: Payer: Self-pay

## 2017-07-22 VITALS — BP 105/78 | HR 107 | Temp 98.6°F | Resp 20 | Ht 60.0 in | Wt 164.2 lb

## 2017-07-22 DIAGNOSIS — C541 Malignant neoplasm of endometrium: Secondary | ICD-10-CM | POA: Insufficient documentation

## 2017-07-22 DIAGNOSIS — Z923 Personal history of irradiation: Secondary | ICD-10-CM | POA: Diagnosis not present

## 2017-07-22 DIAGNOSIS — Z888 Allergy status to other drugs, medicaments and biological substances status: Secondary | ICD-10-CM | POA: Insufficient documentation

## 2017-07-22 DIAGNOSIS — Z08 Encounter for follow-up examination after completed treatment for malignant neoplasm: Secondary | ICD-10-CM | POA: Diagnosis not present

## 2017-07-22 DIAGNOSIS — Z79899 Other long term (current) drug therapy: Secondary | ICD-10-CM | POA: Diagnosis not present

## 2017-07-22 DIAGNOSIS — Y842 Radiological procedure and radiotherapy as the cause of abnormal reaction of the patient, or of later complication, without mention of misadventure at the time of the procedure: Secondary | ICD-10-CM | POA: Insufficient documentation

## 2017-07-22 NOTE — Progress Notes (Signed)
Radiation Oncology         [phone] ________________________________  Name: Rebekah [name] MRN: [id number]  Date: 07/22/2017  DOB: 1955/04/23  Follow-Up Visit Note  CC: Wardell Honour, MD  Wardell Honour, MD    ICD-10-CM   1. Endometrial cancer (HCC) C54.1     Diagnosis:   62 y.o. female with Stage IIIC1, grade 1 endometrial cancer  Interval Since Last Radiation:  1 month Radiation treatment dates:   05/23/2017-06/20/2017 Site/dose:  Pelvis / 30 Gy in 5 fractions, vaginal cuff brachytherapy HDR Ir-192 Vaginal/ 2.5 cm diameter segmented cylinder with treatment length of 3.5 cm.    Narrative:  The patient returns today for routine follow-up of radiation completed 1 month ago to her pelvis. She has not seen Dr. Denman George since finishing radiation. She denies any pain. She reports moderate fatigue. She is not feeling well today. She received chemotherapy 3 days ago. She has one cycle left. She denies any vaginal bleeding or discharge. She denies any rectal bleeding. She reports some nausea, but she has medication for relief. She denies any issues with her bowels. She denies any dysuria. She denies any skin issues.                      ALLERGIES:  is allergic to [employer] tartrate] and soma [carisoprodol].  Meds: Current Outpatient Medications  Medication Sig Dispense Refill  . citalopram (CELEXA) 20 MG tablet Take 1 tablet (20 mg total) by mouth daily. 90 tablet 0  . dexamethasone (DECADRON) 4 MG tablet Take 5 tabs the night before and 5 tabs the morning of chemotherapy, every 3 weeks, take with food 60 tablet 0  . diphenhydramine-acetaminophen (TYLENOL PM) 25-500 MG TABS tablet Take 1 tablet by mouth at bedtime.    . DULoxetine (CYMBALTA) 60 MG capsule TAKE 2 CAPSULES BY MOUTH ONCE DAILY 180 capsule 0  . levETIRAcetam (KEPPRA) 1000 MG tablet Take 2,000 mg by mouth 2 (two) times daily.    Marland Kitchen lidocaine-prilocaine (EMLA) cream Apply to affected area once 30 g 3  .  LORazepam (ATIVAN) 0.5 MG tablet Take 1 tablet (0.5 mg total) by mouth every 8 (eight) hours as needed for anxiety. 60 tablet 0  . Multiple Vitamin (MULTIVITAMIN) capsule Take 1 capsule by mouth daily.    . ondansetron (ZOFRAN) 8 MG tablet Take 1 tablet (8 mg total) by mouth every 8 (eight) hours as needed for refractory nausea / vomiting. Start on day 3 after chemo. 30 tablet 1  . prochlorperazine (COMPAZINE) 10 MG tablet Take 1 tablet (10 mg total) by mouth every 6 (six) hours as needed (Nausea or vomiting). 30 tablet 1  . ranitidine (ZANTAC) 150 MG capsule Take 150 mg by mouth daily.    Marland Kitchen scopolamine (TRANSDERM-SCOP) 1 MG/3DAYS Place 1 patch (1.5 mg total) onto the skin every 3 (three) days. (Patient not taking: Reported on 07/22/2017) 10 patch 12   No current facility-administered medications for this encounter.     Physical Findings: The patient is in no acute distress. Patient is alert and oriented.  height is 5' (1.524 m) and weight is 164 lb 3.2 oz (74.5 kg). Her oral temperature is 98.6 F (37 C). Her blood pressure is 105/78 and her pulse is 107 (abnormal). Her respiration is 20 and oxygen saturation is 97%.   Lungs are clear to auscultation bilaterally. Heart has regular rate and rhythm. No palpable cervical, supraclavicular, or axillary adenopathy. Abdomen soft, non-tender, normal bowel sounds.  Pelvic exam deferred in light of recent completion of radiation treatment.  Lab Findings: Lab Results  Component Value Date   WBC 3.6 (L) 07/19/2017   HGB 11.8 07/19/2017   HCT 34.6 (L) 07/19/2017   MCV 103.8 (H) 07/19/2017   PLT 148 07/19/2017    Radiographic Findings: No results found.  Impression:  The patient is recovering from the effects of radiation.  Patient was given a dilator today. She was given s and xs with instructions on its use. She verbalized understanding.  Plan:  Patient will finish chemotherapy on July 29th. Follow up with Dr. Denman George in 2 months.  Follow up in  radiation oncology in 5 months.   ____________________________________  Blair Promise, PhD, MD  This document serves as a record of services personally performed by Gery Pray, MD. It was created on his behalf by Rae Lips, a trained medical scribe. The creation of this record is based on the scribe's personal observations and the provider's statements to them. This document has been checked and approved by the attending provider.

## 2017-07-22 NOTE — Progress Notes (Signed)
Ms. [name] is here for a follow-up appointment. Denies any pain. States that she has moderate fatigue. Denies any vaginal bleeding or discharge. Denies any rectal bleeding. States that she has some nausea,but she has medication for relief. Denies any issues with her bowels. Denies any dysuria. Denies any skin issues.Patient was given a dialtor today. She was given s and xs   With instruction verbalized understanding. Vitals:   07/22/17 1146  BP: 105/78  Pulse: (!) 107  Resp: 20  Temp: 98.6 F (37 C)  TempSrc: Oral  SpO2: 97%  Weight: 164 lb 3.2 oz (74.5 kg)  Height: 5' (1.524 m)   Wt Readings from Last 3 Encounters:  07/22/17 164 lb 3.2 oz (74.5 kg)  07/19/17 163 lb 14.4 oz (74.3 kg)  06/27/17 162 lb 9.6 oz (73.8 kg)

## 2017-07-23 ENCOUNTER — Encounter: Payer: Self-pay | Admitting: Hematology and Oncology

## 2017-07-24 ENCOUNTER — Telehealth: Payer: Self-pay | Admitting: *Deleted

## 2017-07-24 NOTE — Telephone Encounter (Signed)
LM for daughter to call

## 2017-07-24 NOTE — Telephone Encounter (Signed)
Pt states her legs are feeling heavy. States her "bowels have been moving a lot- 8 times today". TStates this has been going on a long time- not sure if she has told Dr Alvy Bimler.  Instructed to get OTC anti- diarrhea medicine to slow bowels. They are "soft to loose" when she goes. Drinking water makes her bowels move. Is getting IVF from [facility] on Thursday.

## 2017-07-25 ENCOUNTER — Telehealth: Payer: Self-pay

## 2017-07-25 ENCOUNTER — Telehealth: Payer: Self-pay | Admitting: Family Medicine

## 2017-07-25 DIAGNOSIS — C541 Malignant neoplasm of endometrium: Secondary | ICD-10-CM | POA: Diagnosis not present

## 2017-07-25 NOTE — Telephone Encounter (Signed)
The "heaviness" could be due to taxol Will address this in her next cycle There is nothing I can do right now Agree with OTC imodium and IVF for frequent BM

## 2017-07-25 NOTE — Telephone Encounter (Signed)
Called pt to try and reschedule her appt with Dr. Nolon Rod on 08/19/17. Dr. Nolon Rod will not be available that day so when pt calls back, please reschedule her at her convenience with Dr. Nolon Rod for an OV for 2 MONTH RECHECK  Thanks!

## 2017-07-25 NOTE — Telephone Encounter (Signed)
Spoke with pt by phone regarding yesterday's report of loose stools and tired heavy legs.  Pt states she started taking OTC immodium last night and has not had any BM so far today.  Her home care nurse is there now and plan to give her IV hydration.  Pt encouraged to continue drinking plenty of water daily.  As for tired heavy legs, pt encouraged to balance rest with activity/walking to improve symptoms.  Pt verbalizes understanding and knows to call office with any further issues.

## 2017-07-28 ENCOUNTER — Encounter: Payer: Self-pay | Admitting: Hematology and Oncology

## 2017-07-30 ENCOUNTER — Encounter: Payer: Self-pay | Admitting: Hematology and Oncology

## 2017-07-31 ENCOUNTER — Encounter: Payer: Self-pay | Admitting: Hematology and Oncology

## 2017-08-01 ENCOUNTER — Telehealth: Payer: Self-pay

## 2017-08-01 ENCOUNTER — Other Ambulatory Visit: Payer: Self-pay

## 2017-08-01 DIAGNOSIS — C541 Malignant neoplasm of endometrium: Secondary | ICD-10-CM | POA: Diagnosis not present

## 2017-08-01 MED ORDER — CITALOPRAM HYDROBROMIDE 20 MG PO TABS: 20.0000 mg | ORAL_TABLET | Freq: Every day | ORAL | 0 refills | Status: DC

## 2017-08-01 MED FILL — CITALOPRAM HBR 20 MG TABLET: 20 | 90 days supply | Qty: 90 | Fill #0

## 2017-08-01 NOTE — Telephone Encounter (Signed)
Nurse with [facility] called and left a message to call her.  Called back. Requesting refill on Citalopram, Rx sent to pharmacy. Blood pressure today sitting 122/82 and standing 104/70. She is still complaining of some dizziness with getting up quickly. [facility] nurse instructed patient to get up slowly.

## 2017-08-01 NOTE — Telephone Encounter (Signed)
I recommend IVF support daily as needed for symptomatic dizziness

## 2017-08-01 NOTE — Telephone Encounter (Signed)
Called Sunday [facility]. Instructed to do daily IVF as needed for symptomatic dizziness. She verbalized understanding. She said her daughter will give then fluids when she gets off work. Instructed to call off office if needed.

## 2017-08-01 NOTE — Telephone Encounter (Signed)
Called and spoke with Coretta at [facility]. Given order for daily IVF as needed for symptomatic dizziness.  She states that already had a order for daily IV fluids as needed.

## 2017-08-04 ENCOUNTER — Encounter: Payer: Self-pay | Admitting: Hematology and Oncology

## 2017-08-08 DIAGNOSIS — C541 Malignant neoplasm of endometrium: Secondary | ICD-10-CM | POA: Diagnosis not present

## 2017-08-09 DIAGNOSIS — C541 Malignant neoplasm of endometrium: Secondary | ICD-10-CM | POA: Diagnosis not present

## 2017-08-09 DIAGNOSIS — D701 Agranulocytosis secondary to cancer chemotherapy: Secondary | ICD-10-CM | POA: Diagnosis not present

## 2017-08-09 DIAGNOSIS — E860 Dehydration: Secondary | ICD-10-CM | POA: Diagnosis not present

## 2017-08-12 ENCOUNTER — Inpatient Hospital Stay (HOSPITAL_BASED_OUTPATIENT_CLINIC_OR_DEPARTMENT_OTHER): Payer: BLUE CROSS/BLUE SHIELD | Admitting: Hematology and Oncology

## 2017-08-12 ENCOUNTER — Inpatient Hospital Stay: Payer: BLUE CROSS/BLUE SHIELD

## 2017-08-12 ENCOUNTER — Telehealth: Payer: Self-pay

## 2017-08-12 ENCOUNTER — Telehealth: Payer: Self-pay | Admitting: Hematology and Oncology

## 2017-08-12 ENCOUNTER — Telehealth: Payer: Self-pay | Admitting: Cardiovascular Disease

## 2017-08-12 VITALS — BP 110/97 | HR 94 | Temp 98.7°F | Resp 18 | Ht 60.0 in | Wt 163.6 lb

## 2017-08-12 DIAGNOSIS — R55 Syncope and collapse: Secondary | ICD-10-CM

## 2017-08-12 DIAGNOSIS — C541 Malignant neoplasm of endometrium: Secondary | ICD-10-CM

## 2017-08-12 DIAGNOSIS — G620 Drug-induced polyneuropathy: Secondary | ICD-10-CM | POA: Diagnosis not present

## 2017-08-12 DIAGNOSIS — E860 Dehydration: Secondary | ICD-10-CM

## 2017-08-12 DIAGNOSIS — Z79899 Other long term (current) drug therapy: Secondary | ICD-10-CM | POA: Diagnosis not present

## 2017-08-12 DIAGNOSIS — R110 Nausea: Secondary | ICD-10-CM | POA: Diagnosis not present

## 2017-08-12 DIAGNOSIS — Z5111 Encounter for antineoplastic chemotherapy: Secondary | ICD-10-CM | POA: Diagnosis not present

## 2017-08-12 DIAGNOSIS — D61818 Other pancytopenia: Secondary | ICD-10-CM

## 2017-08-12 DIAGNOSIS — T451X5A Adverse effect of antineoplastic and immunosuppressive drugs, initial encounter: Secondary | ICD-10-CM

## 2017-08-12 DIAGNOSIS — C530 Malignant neoplasm of endocervix: Secondary | ICD-10-CM

## 2017-08-12 LAB — CMP (CANCER CENTER ONLY)
ALBUMIN: 3.8 g/dL (ref 3.5–5.0)
ALK PHOS: 73 U/L (ref 38–126)
ALT: 16 U/L (ref 0–44)
AST: 19 U/L (ref 15–41)
Anion gap: 8 (ref 5–15)
BILIRUBIN TOTAL: 0.3 mg/dL (ref 0.3–1.2)
BUN: 8 mg/dL (ref 6–20)
CO2: 28 mmol/L (ref 22–32)
Calcium: 9.2 mg/dL (ref 8.9–10.3)
Chloride: 105 mmol/L (ref 98–111)
Creatinine: 0.75 mg/dL (ref 0.44–1.00)
GFR, Est AFR Am: >60 mL/min (ref >60)
GFR, Estimated: >60 mL/min (ref >60)
GLUCOSE: 98 mg/dL (ref 70–99)
Potassium: 3.8 mmol/L (ref 3.5–5.1)
Sodium: 141 mmol/L (ref 135–145)
TOTAL PROTEIN: 6.9 g/dL (ref 6.5–8.1)

## 2017-08-12 LAB — CBC WITH DIFFERENTIAL (CANCER CENTER ONLY)
BASOS ABS: 0 10*3/uL (ref 0.0–0.1)
BASOS PCT: 0 %
Eosinophils Absolute: 0 10*3/uL (ref 0.0–0.5)
Eosinophils Relative: 0 %
HCT: 32.4 % — ABNORMAL LOW (ref 34.8–46.6)
Hemoglobin: 11 g/dL — ABNORMAL LOW (ref 11.6–15.9)
LYMPHS PCT: 46 %
Lymphs Abs: 1.5 10*3/uL (ref 0.9–3.3)
MCH: 36.2 pg — ABNORMAL HIGH (ref 25.1–34.0)
MCHC: 34 g/dL (ref 31.5–36.0)
MCV: 106.6 fL — AB (ref 79.5–101.0)
MONO ABS: 0.4 10*3/uL (ref 0.1–0.9)
MONOS PCT: 11 %
Neutro Abs: 1.4 10*3/uL — ABNORMAL LOW (ref 1.5–6.5)
Neutrophils Relative %: 43 %
PLATELETS: 114 10*3/uL — AB (ref 145–400)
RBC: 3.04 MIL/uL — ABNORMAL LOW (ref 3.70–5.45)
RDW: 16.1 % — AB (ref 11.2–14.5)
WBC Count: 3.3 10*3/uL — ABNORMAL LOW (ref 3.9–10.3)

## 2017-08-12 MED ORDER — SODIUM CHLORIDE 0.9 % IV SOLN
Freq: Once | INTRAVENOUS | Status: AC
  Administered 2017-08-12: 10:00:00 via INTRAVENOUS
  Filled 2017-08-12: qty 250

## 2017-08-12 MED ORDER — SODIUM CHLORIDE 0.9 % IV SOLN
600.0000 mg | Freq: Once | INTRAVENOUS | Status: AC
  Administered 2017-08-12: 600 mg via INTRAVENOUS
  Filled 2017-08-12: qty 60

## 2017-08-12 MED ORDER — FAMOTIDINE IN NACL 20-0.9 MG/50ML-% IV SOLN
INTRAVENOUS | Status: AC
Start: 2017-08-12 — End: ?
  Filled 2017-08-12: qty 50

## 2017-08-12 MED ORDER — DIPHENHYDRAMINE HCL 50 MG/ML IJ SOLN
INTRAMUSCULAR | Status: AC
  Filled 2017-08-12: qty 1

## 2017-08-12 MED ORDER — PALONOSETRON HCL INJECTION 0.25 MG/5ML
0.2500 mg | Freq: Once | INTRAVENOUS | Status: AC
  Administered 2017-08-12: 0.25 mg via INTRAVENOUS

## 2017-08-12 MED ORDER — PALONOSETRON HCL INJECTION 0.25 MG/5ML
INTRAVENOUS | Status: AC
  Filled 2017-08-12: qty 5

## 2017-08-12 MED ORDER — SODIUM CHLORIDE 0.9% FLUSH
10.0000 mL | Freq: Once | INTRAVENOUS | Status: AC
  Administered 2017-08-12: 10 mL
  Filled 2017-08-12: qty 10

## 2017-08-12 MED ORDER — SODIUM CHLORIDE 0.9% FLUSH
10.0000 mL | INTRAVENOUS | Status: DC | PRN
  Administered 2017-08-12: 10 mL
  Filled 2017-08-12: qty 10

## 2017-08-12 MED ORDER — FAMOTIDINE IN NACL 20-0.9 MG/50ML-% IV SOLN
20.0000 mg | Freq: Once | INTRAVENOUS | Status: AC
  Administered 2017-08-12: 20 mg via INTRAVENOUS

## 2017-08-12 MED ORDER — DIPHENHYDRAMINE HCL 50 MG/ML IJ SOLN
12.5000 mg | Freq: Once | INTRAMUSCULAR | Status: AC
  Administered 2017-08-12: 12.5 mg via INTRAVENOUS

## 2017-08-12 MED ORDER — FOSAPREPITANT DIMEGLUMINE INJECTION 150 MG
Freq: Once | INTRAVENOUS | Status: AC
  Administered 2017-08-12: 11:00:00 via INTRAVENOUS
  Filled 2017-08-12: qty 5

## 2017-08-12 MED ORDER — SODIUM CHLORIDE 0.9 % IV SOLN
105.0000 mg/m2 | Freq: Once | INTRAVENOUS | Status: AC
  Administered 2017-08-12: 186 mg via INTRAVENOUS
  Filled 2017-08-12: qty 31

## 2017-08-12 MED ORDER — HEPARIN SOD (PORK) LOCK FLUSH 100 UNIT/ML IV SOLN
500.0000 [IU] | Freq: Once | INTRAVENOUS | Status: AC | PRN
Start: 2017-08-12 — End: 2017-08-12
  Administered 2017-08-12: 500 [IU]
  Filled 2017-08-12: qty 5

## 2017-08-12 NOTE — Telephone Encounter (Signed)
Talked with daughter in the infusion room. She is going to call and reschedule appt due to a conflict.

## 2017-08-12 NOTE — Telephone Encounter (Signed)
New message   Hassan Rowan from cancer [facility] called on behalf of pt, pt is having episodes of syncope, appt was made for 08/14/17 w/Luke Kilroy. Message is for informational purposes.

## 2017-08-12 NOTE — Telephone Encounter (Signed)
Per 7/29 los.  Gave patient AVS, calendar and prep for CT.

## 2017-08-12 NOTE — Telephone Encounter (Signed)
Called [facility] to get appt with Dr. Oval Linsey for patient per Dr. Alvy Bimler. Patient is experiencing near syncopal episodes and needs to be seen. Appt with PA, Kerin Ransom on 7/31 at 2:30, appt info given to patient in infusion room.

## 2017-08-12 NOTE — Progress Notes (Signed)
Dr. Calton Dach desk RN Hassan Rowan called to say per Dr. Alvy Bimler, [place] to proceed with ANC 1.4. Also in tx conditions for today.

## 2017-08-12 NOTE — Patient Instructions (Signed)
Implanted Port Home Guide An implanted port is a type of central line that is placed under the skin. Central lines are used to provide IV access when treatment or nutrition needs to be given through a person's veins. Implanted ports are used for long-term IV access. An implanted port may be placed because:  You need IV medicine that would be irritating to the small veins in your hands or arms.  You need long-term IV medicines, such as antibiotics.  You need IV nutrition for a long period.  You need frequent blood draws for lab tests.  You need dialysis.  Implanted ports are usually placed in the chest area, but they can also be placed in the upper arm, the abdomen, or the leg. An implanted port has two main parts:  Reservoir. The reservoir is round and will appear as a small, raised area under your skin. The reservoir is the part where a needle is inserted to give medicines or draw blood.  Catheter. The catheter is a thin, flexible tube that extends from the reservoir. The catheter is placed into a large vein. Medicine that is inserted into the reservoir goes into the catheter and then into the vein.  How will I care for my incision site? Do not get the incision site wet. Bathe or shower as directed by your health care provider. How is my port accessed? Special steps must be taken to access the port:  Before the port is accessed, a numbing cream can be placed on the skin. This helps numb the skin over the port site.  Your health care provider uses a sterile technique to access the port. ? Your health care provider must put on a mask and sterile gloves. ? The skin over your port is cleaned carefully with an antiseptic and allowed to dry. ? The port is gently pinched between sterile gloves, and a needle is inserted into the port.  Only "non-coring" port needles should be used to access the port. Once the port is accessed, a blood return should be checked. This helps ensure that the port  is in the vein and is not clogged.  If your port needs to remain accessed for a constant infusion, a clear (transparent) bandage will be placed over the needle site. The bandage and needle will need to be changed every week, or as directed by your health care provider.  Keep the bandage covering the needle clean and dry. Do not get it wet. Follow your health care provider's instructions on how to take a shower or bath while the port is accessed.  If your port does not need to stay accessed, no bandage is needed over the port.  What is flushing? Flushing helps keep the port from getting clogged. Follow your health care provider's instructions on how and when to flush the port. Ports are usually flushed with saline solution or a medicine called heparin. The need for flushing will depend on how the port is used.  If the port is used for intermittent medicines or blood draws, the port will need to be flushed: ? After medicines have been given. ? After blood has been drawn. ? As part of routine maintenance.  If a constant infusion is running, the port may not need to be flushed.  How long will my port stay implanted? The port can stay in for as long as your health care provider thinks it is needed. When it is time for the port to come out, surgery will be   done to remove it. The procedure is similar to the one performed when the port was put in. When should I seek immediate medical care? When you have an implanted port, you should seek immediate medical care if:  You notice a bad smell coming from the incision site.  You have swelling, redness, or drainage at the incision site.  You have more swelling or pain at the port site or the surrounding area.  You have a fever that is not controlled with medicine.  This information is not intended to replace advice given to you by your health care provider. Make sure you discuss any questions you have with your health care provider. Document  Released: 01/01/2005 Document Revised: 06/09/2015 Document Reviewed: 09/08/2012 Elsevier Interactive Patient Education  2017 [employer]vier Inc.  

## 2017-08-12 NOTE — Patient Instructions (Signed)
[facility] Discharge Instructions for Patients Receiving Chemotherapy  Today you received the following chemotherapy agents paclitaxel (Taxol) and carboplatin (Paraplatin)  To help prevent nausea and vomiting after your treatment, we encourage you to take your nausea medication as directed   If you develop nausea and vomiting that is not controlled by your nausea medication, call the clinic.   BELOW ARE SYMPTOMS THAT SHOULD BE REPORTED IMMEDIATELY:  *FEVER GREATER THAN 100.5 F  *CHILLS WITH OR WITHOUT FEVER  NAUSEA AND VOMITING THAT IS NOT CONTROLLED WITH YOUR NAUSEA MEDICATION  *UNUSUAL SHORTNESS OF BREATH  *UNUSUAL BRUISING OR BLEEDING  TENDERNESS IN MOUTH AND THROAT WITH OR WITHOUT PRESENCE OF ULCERS  *URINARY PROBLEMS  *BOWEL PROBLEMS  UNUSUAL RASH Items with * indicate a potential emergency and should be followed up as soon as possible.  Feel free to call the clinic should you have any questions or concerns. The clinic phone number is [phone].  Please show the CHEMO ALERT CARD at check-in to the Emergency Department and triage nurse.   

## 2017-08-13 ENCOUNTER — Encounter: Payer: Self-pay | Admitting: Hematology and Oncology

## 2017-08-13 DIAGNOSIS — D61818 Other pancytopenia: Secondary | ICD-10-CM | POA: Insufficient documentation

## 2017-08-13 NOTE — Assessment & Plan Note (Addendum)
She has experienced various side effects from treatment including near syncopal episode, significant dehydration, and worsening peripheral neuropathy I plan to reduce the dose of chemotherapy She will continue aggressive supportive care at home through advanced home care agency After completion of chemotherapy, I plan to repeat imaging study next month before I see her

## 2017-08-13 NOTE — Assessment & Plan Note (Signed)
She has worsening peripheral neuropathy I plan to reduce the dose of chemotherapy further

## 2017-08-13 NOTE — Assessment & Plan Note (Signed)
She had symptoms of vasovagal syncopal episodes at home She is already receiving daily IV fluid support She has not seen cardiologist for over 2 years I recommend consultation again with cardiologist for evaluation for syncopal episode

## 2017-08-13 NOTE — Progress Notes (Signed)
[place] OFFICE PROGRESS NOTE  Patient Care Team: Wardell Honour, MD as PCP - General (Family Medicine)  ASSESSMENT & PLAN:  Endometrial cancer [facility]) She has experienced various side effects from treatment including near syncopal episode, significant dehydration, and worsening peripheral neuropathy I plan to reduce the dose of chemotherapy She will continue aggressive supportive care at home through advanced home care agency After completion of chemotherapy, I plan to repeat imaging study next month before I see her  Peripheral neuropathy due to chemotherapy [facility]) She has worsening peripheral neuropathy I plan to reduce the dose of chemotherapy further  Pancytopenia, acquired [facility]) She has mild worsening acquired pancytopenia I plan to proceed with treatment with dose reduction She does not need transfusion support  Syncope, vasovagal She had symptoms of vasovagal syncopal episodes at home She is already receiving daily IV fluid support She has not seen cardiologist for over 2 years I recommend consultation again with cardiologist for evaluation for syncopal episode   Orders Placed This Encounter  Procedures  . CT ABDOMEN PELVIS W CONTRAST    Standing Status:   Future    Standing Expiration Date:   08/13/2018    Order Specific Question:   If indicated for the ordered procedure, I authorize the administration of contrast media per Radiology protocol    Answer:   Yes    Order Specific Question:   Preferred imaging location?    Answer:   [facility]    Order Specific Question:   Radiology Contrast Protocol - do NOT remove file path    Answer:   [email or web address]    Order Specific Question:   Is patient pregnant?    Answer:   No  . Ambulatory referral to Cardiology    Referral Priority:   Routine    Referral Type:   Consultation    Referral Reason:   Specialty Services Required    Referred to Provider:   Skeet Latch,  MD    Requested Specialty:   Cardiology    Number of Visits Requested:   1    INTERVAL HISTORY: Please see below for problem oriented charting. She returns with her daughter for treatment today She had fallen twice at home due to what sounds like vasovagal syncopal episode She has been receiving aggressive IV fluid support at home She felt that neuropathy is slightly worse The patient denies any recent signs or symptoms of bleeding such as spontaneous epistaxis, hematuria or hematochezia. She denies significant nausea or constipation with recent treatment No recent infection, fever or chills  SUMMARY OF ONCOLOGIC HISTORY: Oncology History   Endometrioid with focal squamous  MSI stable     Endometrial cancer ([place])   02/20/2017 Pathology Results    Endometrium, biopsy - ENDOMETRIOID ADENOCARCINOMA, FIGO GRADE I, WITH SQUAMOUS DIFFERENTIATION. - SEE COMMENT      02/20/2017 Initial Diagnosis    Initially presented with PMB      03/26/2017 Pathology Results    1. Lymph node, sentinel, biopsy, right external iliac - LYMPH NODE, NEGATIVE FOR CARCINOMA (0/1). SEE NOTE 2. Lymph node, sentinel, biopsy, left external iliac - FOCUS OF METASTATIC CARCINOMA TO ONE LYMPH NODE, 0.3 MM (1/1). SEE NOTE 3. Uterus +/- tubes/ovaries, neoplastic, cervix - ENDOMETRIOID ADENOCARCINOMA WITH FOCAL SQUAMOUS DIFFERENTIATION, 3.5 CM, FIGO GRADE I, INVOLVING ANTERIOR AND POSTERIOR ENDOMETRIUM - TUMOR INVADES MORE THAN 90% OF MYOMETRIAL THICKNESS - LYMPHOVASCULAR INVASION IS IDENTIFIED - TUMOR INTRALUMINALLY EXTENDS INTO THE RIGHT FALLOPIAN TUBE; LEFT FALLOPIAN TUBE AND  BILATERAL OVARIES ARE UNREMARKABLE - SEE ONCOLOGY TABLE Microscopic Comment 3. ONCOLOGY TABLE-UTERUS, CARCINOMA OR CARCINOSARCOMA  Specimen: Uterus, tubes, ovaries and cervix Procedure: Total hysterectomy and bilateral salpingo-oophorectomy Lymph node sampling performed: Yes Specimen integrity: Intact Maximum tumor size: 3.5  cm Histologic type: Endometrioid adenocarcinoma with focal squamous differentiation Grade: I Myometrial invasion: 1.4 cm where myometrium is 1.5 cm in thickness Uterine Serosa Involvement: Not identified Cervical stromal involvement: Not identified Extent of involvement of other organs: Right fallopian tube Lymph - vascular invasion: Present Lymph nodes: Examined: 2 Sentinel 0 Non-sentinel 2 Total Lymph nodes with metastasis: 1 Isolated tumor cells (< 0.2 mm): 0 TNM code: pT1b, pN68m FIGO Stage (based on pathologic findings, needs clinical correlation): IIIC      03/26/2017 Surgery    Pre-operative Diagnosis: endometrial cancer grade 1  Operation: Robotic-assisted laparoscopic total hysterectomy with bilateral salpingoophorectomy, SLN biopsy   Surgeon: RDonaciano Eva Operative Findings:  : normal 7cm uterus, tubes and ovaries, normal appearing nodes, no apparent extrauterine disease        04/11/2017 Cancer Staging    Staging form: Corpus Uteri - Carcinoma and Carcinosarcoma, AJCC 8th Edition - Pathologic: FIGO Stage IIIC1 (pT3a, pN1, cM0) - Signed by GHeath Lark MD on 04/11/2017      04/24/2017 Procedure    Successful placement of a right IJ approach Power Port with ultrasound and fluoroscopic guidance. The catheter is ready for use.      04/25/2017 -  Chemotherapy    The patient had carboplatin and Taxol for chemotherapy treatment.        05/23/2017 - 06/20/2017 Radiation Therapy    Radiation treatment dates:   05/23/2017 - 06/20/2017  Site/dose:   Vaginal cuff / 30 Gy delivered in 5 fractions of 6 Gy  Beams/energy:  HDR Ir-192 Vaginal/ 2.5 cm diameter segmented cylinder with treatment length of 3.5 cm. prescription to the vaginal mucosal surface       06/06/2017 Genetic Testing    The Common Hereditary Cancer Panel offered by [facility] includes sequencing and/or deletion duplication testing of the following 47 genes: APC, ATM, AXIN2, BARD1, BMPR1A, BRCA1,  BRCA2, BRIP1, CDH1, CDKN2A (p14ARF), CDKN2A (p16INK4a), CKD4, CHEK2, CTNNA1, DICER1, EPCAM (Deletion/duplication testing only), GREM1 (promoter region deletion/duplication testing only), KIT, MEN1, MLH1, MSH2, MSH3, MSH6, MUTYH, NBN, NF1, NHTL1, PALB2, PDGFRA, PMS2, POLD1, POLE, PTEN, RAD50, RAD51C, RAD51D, SDHB, SDHC, SDHD, SMAD4, SMARCA4. STK11, TP53, TSC1, TSC2, and VHL.  The following genes were evaluated for sequence changes only: SDHA and HOXB13 c.251G>A variant only.  Results: Negative, No pathogenic variants identified. The date of this test report is 06/06/2017.        REVIEW OF SYSTEMS:   Constitutional: Denies fevers, chills or abnormal weight loss Eyes: Denies blurriness of vision Ears, nose, mouth, throat, and face: Denies mucositis or sore throat Respiratory: Denies cough, dyspnea or wheezes Cardiovascular: Denies palpitation, chest discomfort or lower extremity swelling Gastrointestinal:  Denies nausea, heartburn or change in bowel habits Skin: Denies abnormal skin rashes Lymphatics: Denies new lymphadenopathy or easy bruising Behavioral/Psych: Mood is stable, no new changes  All other systems were reviewed with the patient and are negative.  I have reviewed the past medical history, past surgical history, social history and family history with the patient and they are unchanged from previous note.  ALLERGIES:  is allergic to aTeachers Insurance and Annuity Associationtartrate] and soma [carisoprodol].  MEDICATIONS:  Current Outpatient Medications  Medication Sig Dispense Refill  . citalopram (CELEXA) 20 MG tablet Take 1 tablet (20 mg  total) by mouth daily. 90 tablet 0  . diphenhydramine-acetaminophen (TYLENOL PM) 25-500 MG TABS tablet Take 1 tablet by mouth at bedtime.    . DULoxetine (CYMBALTA) 60 MG capsule TAKE 2 CAPSULES BY MOUTH ONCE DAILY 180 capsule 0  . levETIRAcetam (KEPPRA) 1000 MG tablet Take 2,000 mg by mouth 2 (two) times daily.    Marland Kitchen LORazepam (ATIVAN) 0.5 MG tablet Take 1 tablet (0.5  mg total) by mouth every 8 (eight) hours as needed for anxiety. 60 tablet 0  . Multiple Vitamin (MULTIVITAMIN) capsule Take 1 capsule by mouth daily.    . ondansetron (ZOFRAN) 8 MG tablet Take 1 tablet (8 mg total) by mouth every 8 (eight) hours as needed for refractory nausea / vomiting. Start on day 3 after chemo. 30 tablet 1  . prochlorperazine (COMPAZINE) 10 MG tablet Take 1 tablet (10 mg total) by mouth every 6 (six) hours as needed (Nausea or vomiting). 30 tablet 1  . ranitidine (ZANTAC) 150 MG capsule Take 150 mg by mouth daily.     No current facility-administered medications for this visit.     PHYSICAL EXAMINATION: ECOG PERFORMANCE STATUS: 2 - Symptomatic, <50% confined to bed  Vitals:   08/12/17 0931  BP: (!) 110/97  Pulse: 94  Resp: 18  Temp: 98.7 F (37.1 C)  SpO2: 99%   Filed Weights   08/12/17 0931  Weight: 163 lb 9.6 oz (74.2 kg)    GENERAL:alert, no distress and comfortable SKIN: skin color, texture, turgor are normal, no rashes or significant lesions EYES: normal, Conjunctiva are pink and non-injected, sclera clear OROPHARYNX:no exudate, no erythema and lips, buccal mucosa, and tongue normal  NECK: supple, thyroid normal size, non-tender, without nodularity LYMPH:  no palpable lymphadenopathy in the cervical, axillary or inguinal LUNGS: clear to auscultation and percussion with normal breathing effort HEART: regular rate & rhythm and no murmurs and no lower extremity edema ABDOMEN:abdomen soft, non-tender and normal bowel sounds Musculoskeletal:no cyanosis of digits and no clubbing  NEURO: alert & oriented x 3 with fluent speech, no focal motor/sensory deficits  LABORATORY DATA:  I have reviewed the data as listed    Component Value Date/Time   NA 141 08/12/2017 0835   NA 140 11/07/2016 1551   K 3.8 08/12/2017 0835   CL 105 08/12/2017 0835   CO2 28 08/12/2017 0835   GLUCOSE 98 08/12/2017 0835   BUN 8 08/12/2017 0835   BUN 11 11/07/2016 1551    CREATININE 0.75 08/12/2017 0835   CREATININE 0.79 12/02/2015 1146   CALCIUM 9.2 08/12/2017 0835   PROT 6.9 08/12/2017 0835   PROT 6.7 11/07/2016 1551   ALBUMIN 3.8 08/12/2017 0835   ALBUMIN 4.4 11/07/2016 1551   AST 19 08/12/2017 0835   ALT 16 08/12/2017 0835   ALKPHOS 73 08/12/2017 0835   BILITOT 0.3 08/12/2017 0835   GFRNONAA >60 08/12/2017 0835   GFRAA >60 08/12/2017 0835    No results found for: SPEP, UPEP  Lab Results  Component Value Date   WBC 3.3 (L) 08/12/2017   NEUTROABS 1.4 (L) 08/12/2017   HGB 11.0 (L) 08/12/2017   HCT 32.4 (L) 08/12/2017   MCV 106.6 (H) 08/12/2017   PLT 114 (L) 08/12/2017      Chemistry      Component Value Date/Time   NA 141 08/12/2017 0835   NA 140 11/07/2016 1551   K 3.8 08/12/2017 0835   CL 105 08/12/2017 0835   CO2 28 08/12/2017 0835   BUN 8 08/12/2017  0835   BUN 11 11/07/2016 1551   CREATININE 0.75 08/12/2017 0835   CREATININE 0.79 12/02/2015 1146      Component Value Date/Time   CALCIUM 9.2 08/12/2017 0835   ALKPHOS 73 08/12/2017 0835   AST 19 08/12/2017 0835   ALT 16 08/12/2017 0835   BILITOT 0.3 08/12/2017 0835      All questions were answered. The patient knows to call the clinic with any problems, questions or concerns. No barriers to learning was detected.  I spent 25 minutes counseling the patient face to face. The total time spent in the appointment was 40 minutes and more than 50% was on counseling and review of test results  Heath Lark, MD 08/13/2017 1:09 PM

## 2017-08-13 NOTE — Assessment & Plan Note (Signed)
She has mild worsening acquired pancytopenia I plan to proceed with treatment with dose reduction She does not need transfusion support

## 2017-08-14 ENCOUNTER — Ambulatory Visit: Payer: BLUE CROSS/BLUE SHIELD | Admitting: Cardiology

## 2017-08-19 ENCOUNTER — Ambulatory Visit: Payer: BLUE CROSS/BLUE SHIELD | Admitting: Family Medicine

## 2017-08-19 ENCOUNTER — Encounter: Payer: Self-pay | Admitting: Family Medicine

## 2017-08-19 ENCOUNTER — Other Ambulatory Visit: Payer: Self-pay

## 2017-08-19 VITALS — BP 126/87 | HR 110 | Temp 98.9°F | Ht 59.0 in | Wt 161.2 lb

## 2017-08-19 DIAGNOSIS — M797 Fibromyalgia: Secondary | ICD-10-CM

## 2017-08-19 DIAGNOSIS — R42 Dizziness and giddiness: Secondary | ICD-10-CM

## 2017-08-19 DIAGNOSIS — C541 Malignant neoplasm of endometrium: Secondary | ICD-10-CM

## 2017-08-19 MED ORDER — MELOXICAM 7.5 MG PO TABS: 7.5000 mg | ORAL_TABLET | Freq: Every day | ORAL | 1 refills | Status: DC

## 2017-08-19 MED ORDER — RANITIDINE HCL 150 MG PO CAPS: 150.0000 mg | ORAL_CAPSULE | Freq: Two times a day (BID) | ORAL | 1 refills | Status: AC

## 2017-08-19 NOTE — Progress Notes (Signed)
8/5/20198:55 AM  Rebekah [name] Apr 17, 1955, 62 y.o. female [id number]  Chief Complaint  Patient presents with  . Transitions Of Care    very tired from chemo, Last tx was Monday of last week. Has fibromyalgia and pain in the legs and shoulders.     HPI:   Patient is a 62 y.o. female with past medical history significant for depression, fibromyalgia, seizures, endometrial cancer who presents today for followup  Patient completed chemo and radiation. Has ct scan scheduled for end of this month and followup with oncology. Chemo cisplatin and taxol. Was having significant perinerpal neuropathy but that improved with decrease in chemo Having recurring dizziness with fall but no LOC. Thought to be 2/2 dehydration from chemo. onc referred to cards.  Overall feeling tired, very achy, states fibromyalgia not well controlled  But still able to care for her 54 month old granddaughter  She reports trial of flexeril and gabapentin for fibromyalgia, did not seem to work, she does not recall. She was lyrica but not covered by insurance, too expensive She has not tried NSAIDs Not able to exercise much  She is off pred from chemo No h/o GI bleed  On keppra for seziures, stable, sees neuro next month  Dr Amalia Hailey - Neurology Dr Talbert Nan - Gyn Dr Elinor Parkinson - Gyn Onc Dr Addison Bailey - Onc Dr Luretha Rued - Rad Onc   Fall Risk  08/19/2017 07/22/2017 06/20/2017 05/23/2017 02/13/2017  Falls in the past year? No No No No No  Number falls in past yr: - - - - -     Depression screen [facility] 2/9 08/19/2017 06/20/2017 02/13/2017  Decreased Interest 0 3 1  Down, Depressed, Hopeless 0 2 2  PHQ - 2 Score 0 5 3  Altered sleeping - 3 3  Tired, decreased energy - 3 1  Change in appetite - 3 3  Feeling bad or failure about yourself  - 3 2  Trouble concentrating - 1 0  Moving slowly or fidgety/restless - 3 0  Suicidal thoughts - 0 0  PHQ-9 Score - 21 12  Difficult doing work/chores - Not difficult at all Not difficult at  all  Some recent data might be hidden    Allergies  Allergen Reactions  . Ambien [Zolpidem Tartrate] Other (See Comments)    Other reaction(s): Hallucination  . Soma [Carisoprodol] Other (See Comments)    chills    Prior to Admission medications   Medication Sig Start Date End Date Taking? Authorizing Provider  citalopram (CELEXA) 20 MG tablet Take 1 tablet (20 mg total) by mouth daily. 08/01/17  Yes Gorsuch, Ni, MD  diphenhydramine-acetaminophen (TYLENOL PM) 25-500 MG TABS tablet Take 1 tablet by mouth at bedtime.   Yes [provider]  DULoxetine (CYMBALTA) 60 MG capsule TAKE 2 CAPSULES BY MOUTH ONCE DAILY 07/08/17  Yes Wardell Honour, MD  levETIRAcetam (KEPPRA) 1000 MG tablet Take 2,000 mg by mouth 2 (two) times daily.   Yes [provider]  LORazepam (ATIVAN) 0.5 MG tablet Take 1 tablet (0.5 mg total) by mouth every 8 (eight) hours as needed for anxiety. 04/19/17  Yes Heath Lark, MD  Multiple Vitamin (MULTIVITAMIN) capsule Take 1 capsule by mouth daily.   Yes [provider]  ondansetron (ZOFRAN) 8 MG tablet Take 1 tablet (8 mg total) by mouth every 8 (eight) hours as needed for refractory nausea / vomiting. Start on day 3 after chemo. 04/19/17  Yes Gorsuch, Ni, MD  prochlorperazine (COMPAZINE) 10 MG tablet Take 1  tablet (10 mg total) by mouth every 6 (six) hours as needed (Nausea or vomiting). 04/19/17  Yes Gorsuch, Ni, MD  ranitidine (ZANTAC) 150 MG capsule Take 150 mg by mouth daily.   Yes [provider]    Past Medical History:  Diagnosis Date  . Anxiety   . Arthritis   . Asthma    NO INHALERS USED  . Cancer ([place])    ENDOMETRIAL  . Depression   . Family history of breast cancer   . Family history of colon cancer   . Fibromyalgia   . GERD (gastroesophageal reflux disease)   . Insomnia   . Seizure disorder ([place])    LAST SEIZURE FEBRUARY 2019 SEES [facility]   . Seizures ([place])     Past Surgical History:  Procedure  Laterality Date  . ABDOMINAL HYSTERECTOMY    . BREAST BIOPSY Right    biopsy in the 1990's  . BUNIONECTOMY Bilateral    2000's - R ONCE and L once  . CARPAL TUNNEL RELEASE Bilateral   . COLONSCOPY    . ENDOMETRIAL BIOPSY  02/2017  . IR FLUORO GUIDE PORT INSERTION RIGHT  04/24/2017  . IR US GUIDE VASC ACCESS RIGHT  04/24/2017  . ROBOTIC ASSISTED TOTAL HYSTERECTOMY WITH BILATERAL SALPINGO OOPHERECTOMY Bilateral 03/26/2017   Procedure: XI ROBOTIC ASSISTED TOTAL HYSTERECTOMY WITH BILATERAL SALPINGO OOPHORECTOMY AND SENTINAL LYMPH NODES;  Surgeon: Everitt Amber, MD;  Location: [facility];  Service: Gynecology;  Laterality: Bilateral;  . TUBAL LIGATION  1984  . TUBAL LIGATION      Social History   Tobacco Use  . Smoking status: Never Smoker  . Smokeless tobacco: Never Used  Substance Use Topics  . Alcohol use: No    Alcohol/week: 0.0 oz    Family History  Problem Relation Age of Onset  . Breast cancer Mother 69  . Diabetes Brother   . Hypertension Brother   . Stroke Brother   . Breast cancer Maternal Grandmother        dx >50  . Breast cancer Maternal Aunt        dx <50  . Colon cancer Paternal Aunt   . Cancer Paternal Uncle        type unk  . Breast cancer Maternal Aunt        dx >50  . Cancer Paternal Aunt   . Cancer Paternal Uncle     Review of Systems  Constitutional: Negative for chills and fever.  Respiratory: Negative for cough and shortness of breath.   Cardiovascular: Negative for chest pain, palpitations and leg swelling.  Gastrointestinal: Negative for abdominal pain, nausea and vomiting.  Musculoskeletal: Positive for falls and myalgias.  Neurological: Positive for dizziness and tingling.     OBJECTIVE:  Blood pressure 126/87, pulse (!) 110, temperature 98.9 F (37.2 C), temperature source Oral, height 4\' 11"  (1.499 m), weight 161 lb 3.2 oz (73.1 kg), SpO2 96 %. Body mass index is 32.56 kg/m.   Physical Exam  Constitutional: She is oriented to person,  place, and time. She appears well-developed and well-nourished.  HENT:  Head: Normocephalic and atraumatic.  Mouth/Throat: Oropharynx is clear and moist. No oropharyngeal exudate.  Eyes: Pupils are equal, round, and reactive to light. EOM are normal. No scleral icterus.  Neck: Neck supple.  Cardiovascular: Normal rate, regular rhythm and normal heart sounds. Exam reveals no gallop and no friction rub.  No murmur heard. Pulmonary/Chest: Effort normal and breath sounds normal. She has no  wheezes. She has no rales.  Musculoskeletal: She exhibits no edema.  Neurological: She is alert and oriented to person, place, and time.  Skin: Skin is warm and dry.  Psychiatric: She has a normal mood and affect.  Nursing note and vitals reviewed.   ASSESSMENT and PLAN  1. Fibromyalgia Adding meloxicam. New med r/se/b reviewed. Discussed gentle exercise. No TCA as she is on SSRI and SNRI. Increasing H2B for GI protection.  2. Endometrial cancer ([place]) Per onc. Coping ok, good family support  3. Dizziness Referred to cards by onc  Other orders - meloxicam (MOBIC) 7.5 MG tablet; Take 1 tablet (7.5 mg total) by mouth daily. - ranitidine (ZANTAC) 150 MG capsule; Take 1 capsule (150 mg total) by mouth 2 (two) times daily.  Return in about 6 weeks (around 09/30/2017).    Rutherford Guys, MD [facility] [place] [address], [place] [address] Ph.  [phone] Fax [phone]

## 2017-08-19 NOTE — Patient Instructions (Addendum)
I am prescribing meloxicam, low dose, to take at bedtime, for fibromylagia in addition to your duloxetine. I am increasing your ranitidine (zantac) to twice a day due to increase risk if gastrointestinal bleeding   Myofascial Pain Syndrome and Fibromyalgia Myofascial pain syndrome and fibromyalgia are both pain disorders. This pain may be felt mainly in your muscles.  Myofascial pain syndrome: ? Always has trigger points or tender points in the muscle that will cause pain when pressed. The pain may come and go. ? Usually affects your neck, upper back, and shoulder areas. The pain often radiates into your arms and hands.  Fibromyalgia: ? Has muscle pains and tenderness that come and go. ? Is often associated with fatigue and sleep disturbances. ? Has trigger points. ? Tends to be long-lasting (chronic), but is not life-threatening.  Fibromyalgia and myofascial pain are not the same. However, they often occur together. If you have both conditions, each can make the other worse. Both are common and can cause enough pain and fatigue to make day-to-day activities difficult. What are the causes? The exact causes of fibromyalgia and myofascial pain are not known. People with certain gene types may be more likely to develop fibromyalgia. Some factors can be triggers for both conditions, such as:  Spine disorders.  Arthritis.  Severe injury (trauma) and other physical stressors.  Being under a lot of stress.  A medical illness.  What are the signs or symptoms? Fibromyalgia The main symptom of fibromyalgia is widespread pain and tenderness in your muscles. This can vary over time. Pain is sometimes described as stabbing, shooting, or burning. You may have tingling or numbness, too. You may also have sleep problems and fatigue. You may wake up feeling tired and groggy (fibro fog). Other symptoms may include:  Bowel and bladder problems.  Headaches.  Visual problems.  Problems with  odors and noises.  Depression or mood changes.  Painful menstrual periods (dysmenorrhea).  Dry skin or eyes.  Myofascial pain syndrome Symptoms of myofascial pain syndrome include:  Tight, ropy bands of muscle.  Uncomfortable sensations in muscular areas, such as: ? Aching. ? Cramping. ? Burning. ? Numbness. ? Tingling. ? Muscle weakness.  Trouble moving certain muscles freely (range of motion).  How is this diagnosed? There are no specific tests to diagnose fibromyalgia or myofascial pain syndrome. Both can be hard to diagnose because their symptoms are common in many other conditions. Your health care provider may suspect one or both of these conditions based on your symptoms and medical history. Your health care provider will also do a physical exam. The key to diagnosing fibromyalgia is having pain, fatigue, and other symptoms for more than three months that cannot be explained by another condition. The key to diagnosing myofascial pain syndrome is finding trigger points in muscles that are tender and cause pain elsewhere in your body (referred pain). How is this treated? Treating fibromyalgia and myofascial pain often requires a team of health care providers. This usually starts with your primary provider and a physical therapist. You may also find it helpful to work with alternative health care providers, such as massage therapists or acupuncturists. Treatment for fibromyalgia may include medicines. This may include nonsteroidal anti-inflammatory drugs (NSAIDs), along with other medicines. Treatment for myofascial pain may also include:  NSAIDs.  Cooling and stretching of muscles.  Trigger point injections.  Sound wave (ultrasound) treatments to stimulate muscles.  Follow these instructions at home:  Take medicines only as directed by your health care  provider.  Exercise as directed by your health care provider or physical therapist.  Try to avoid stressful  situations.  Practice relaxation techniques to control your stress. You may want to try: ? Biofeedback. ? Visual imagery. ? Hypnosis. ? Muscle relaxation. ? Yoga. ? Meditation.  Talk to your health care provider about alternative treatments, such as acupuncture or massage treatment.  Maintain a healthy lifestyle. This includes eating a healthy diet and getting enough sleep.  Consider joining a support group.  Do not do activities that stress or strain your muscles. That includes repetitive motions and heavy lifting. Where to find more information:  National Fibromyalgia Association: www.fmaware.[place]: www.arthritis.org  American Chronic Pain Association: [employer].dk Contact a health care provider if:  You have new symptoms.  Your symptoms get worse.  You have side effects from your medicines.  You have trouble sleeping.  Your condition is causing depression or anxiety. This information is not intended to replace advice given to you by your health care provider. Make sure you discuss any questions you have with your health care provider. Document Released: 01/01/2005 Document Revised: 06/09/2015 Document Reviewed: 10/07/2013 Elsevier Interactive Patient Education  2018 [employer].    IF you received an x-ray today, you will receive an invoice from [facility]. Please contact [facility] at [phone] with questions or concerns regarding your invoice.   IF you received labwork today, you will receive an invoice from [facility]. Please contact [facility] at [phone] with questions or concerns regarding your invoice.   Our billing staff will not be able to assist you with questions regarding bills from these companies.  You will be contacted with the lab results as soon as they are available. The fastest way to get your results is to activate your My Chart account. Instructions are located on the last  page of this paperwork. If you have not heard from [place] regarding the results in 2 weeks, please contact this office.

## 2017-08-20 DIAGNOSIS — C541 Malignant neoplasm of endometrium: Secondary | ICD-10-CM | POA: Diagnosis not present

## 2017-08-21 ENCOUNTER — Encounter: Payer: Self-pay | Admitting: Hematology and Oncology

## 2017-08-26 ENCOUNTER — Encounter: Payer: Self-pay | Admitting: Hematology and Oncology

## 2017-08-28 DIAGNOSIS — C541 Malignant neoplasm of endometrium: Secondary | ICD-10-CM | POA: Diagnosis not present

## 2017-08-29 ENCOUNTER — Encounter: Payer: Self-pay | Admitting: Hematology and Oncology

## 2017-09-02 ENCOUNTER — Ambulatory Visit: Payer: BLUE CROSS/BLUE SHIELD | Admitting: Cardiology

## 2017-09-05 DIAGNOSIS — C541 Malignant neoplasm of endometrium: Secondary | ICD-10-CM | POA: Diagnosis not present

## 2017-09-05 DIAGNOSIS — G40219 Localization-related (focal) (partial) symptomatic epilepsy and epileptic syndromes with complex partial seizures, intractable, without status epilepticus: Secondary | ICD-10-CM | POA: Diagnosis not present

## 2017-09-10 DIAGNOSIS — C541 Malignant neoplasm of endometrium: Secondary | ICD-10-CM | POA: Diagnosis not present

## 2017-09-11 ENCOUNTER — Inpatient Hospital Stay: Payer: BLUE CROSS/BLUE SHIELD | Attending: Gynecologic Oncology

## 2017-09-11 ENCOUNTER — Inpatient Hospital Stay: Payer: BLUE CROSS/BLUE SHIELD

## 2017-09-11 ENCOUNTER — Ambulatory Visit (HOSPITAL_COMMUNITY)
Admission: RE | Admit: 2017-09-11 | Discharge: 2017-09-11 | Disposition: A | Discharge status: Home / Self Care | Payer: BLUE CROSS/BLUE SHIELD | Source: Ambulatory Visit | Attending: Hematology and Oncology | Admitting: Hematology and Oncology

## 2017-09-11 ENCOUNTER — Encounter (HOSPITAL_COMMUNITY): Payer: Self-pay

## 2017-09-11 DIAGNOSIS — C541 Malignant neoplasm of endometrium: Secondary | ICD-10-CM

## 2017-09-11 DIAGNOSIS — R530 Neoplastic (malignant) related fatigue: Secondary | ICD-10-CM | POA: Insufficient documentation

## 2017-09-11 DIAGNOSIS — R590 Localized enlarged lymph nodes: Secondary | ICD-10-CM | POA: Insufficient documentation

## 2017-09-11 DIAGNOSIS — Z9221 Personal history of antineoplastic chemotherapy: Secondary | ICD-10-CM | POA: Insufficient documentation

## 2017-09-11 DIAGNOSIS — Z452 Encounter for adjustment and management of vascular access device: Secondary | ICD-10-CM | POA: Diagnosis not present

## 2017-09-11 DIAGNOSIS — Z90710 Acquired absence of both cervix and uterus: Secondary | ICD-10-CM | POA: Insufficient documentation

## 2017-09-11 DIAGNOSIS — G620 Drug-induced polyneuropathy: Secondary | ICD-10-CM | POA: Diagnosis not present

## 2017-09-11 DIAGNOSIS — Z79899 Other long term (current) drug therapy: Secondary | ICD-10-CM | POA: Diagnosis not present

## 2017-09-11 DIAGNOSIS — D61818 Other pancytopenia: Secondary | ICD-10-CM | POA: Insufficient documentation

## 2017-09-11 DIAGNOSIS — Z923 Personal history of irradiation: Secondary | ICD-10-CM | POA: Diagnosis not present

## 2017-09-11 LAB — CBC WITH DIFFERENTIAL (CANCER CENTER ONLY)
Basophils Absolute: 0 K/uL (ref 0.0–0.1)
Basophils Relative: 0 %
Eosinophils Absolute: 0 K/uL (ref 0.0–0.5)
Eosinophils Relative: 1 %
HCT: 31.9 % — ABNORMAL LOW (ref 34.8–46.6)
Hemoglobin: 10.9 g/dL — ABNORMAL LOW (ref 11.6–15.9)
Lymphocytes Relative: 46 %
Lymphs Abs: 1.5 K/uL (ref 0.9–3.3)
MCH: 38.1 pg — ABNORMAL HIGH (ref 25.1–34.0)
MCHC: 34.3 g/dL (ref 31.5–36.0)
MCV: 111.2 fL — ABNORMAL HIGH (ref 79.5–101.0)
Monocytes Absolute: 0.4 K/uL (ref 0.1–0.9)
Monocytes Relative: 12 %
Neutro Abs: 1.3 K/uL — ABNORMAL LOW (ref 1.5–6.5)
Neutrophils Relative %: 41 %
Platelet Count: 158 K/uL (ref 145–400)
RBC: 2.87 MIL/uL — ABNORMAL LOW (ref 3.70–5.45)
RDW: 15.6 % — ABNORMAL HIGH (ref 11.2–14.5)
WBC Count: 3.2 K/uL — ABNORMAL LOW (ref 3.9–10.3)

## 2017-09-11 LAB — CMP (CANCER CENTER ONLY)
ALT: 12 U/L (ref 0–44)
AST: 21 U/L (ref 15–41)
Albumin: 4 g/dL (ref 3.5–5.0)
Alkaline Phosphatase: 80 U/L (ref 38–126)
Anion gap: 9 (ref 5–15)
BUN: 11 mg/dL (ref 6–20)
CO2: 28 mmol/L (ref 22–32)
Calcium: 9.2 mg/dL (ref 8.9–10.3)
Chloride: 103 mmol/L (ref 98–111)
Creatinine: 0.8 mg/dL (ref 0.44–1.00)
GFR, Est AFR Am: >60 mL/min
GFR, Estimated: >60 mL/min
Glucose, Bld: 97 mg/dL (ref 70–99)
Potassium: 3.8 mmol/L (ref 3.5–5.1)
Sodium: 140 mmol/L (ref 135–145)
Total Bilirubin: 0.3 mg/dL (ref 0.3–1.2)
Total Protein: 7 g/dL (ref 6.5–8.1)

## 2017-09-11 MED ORDER — SODIUM CHLORIDE 0.9% FLUSH
10.0000 mL | Freq: Once | INTRAVENOUS | Status: AC
  Administered 2017-09-11: 10 mL
  Filled 2017-09-11: qty 10

## 2017-09-11 MED ORDER — IOHEXOL 300 MG/ML  SOLN
100.0000 mL | Freq: Once | INTRAMUSCULAR | Status: AC | PRN
  Administered 2017-09-11: 100 mL via INTRAVENOUS

## 2017-09-11 MED ORDER — HEPARIN SOD (PORK) LOCK FLUSH 100 UNIT/ML IV SOLN
500.0000 [IU] | Freq: Once | INTRAVENOUS | Status: AC
  Administered 2017-09-11: 500 [IU] via INTRAVENOUS

## 2017-09-11 MED ORDER — HEPARIN SOD (PORK) LOCK FLUSH 100 UNIT/ML IV SOLN
INTRAVENOUS | Status: AC
  Filled 2017-09-11: qty 5

## 2017-09-13 ENCOUNTER — Inpatient Hospital Stay (HOSPITAL_BASED_OUTPATIENT_CLINIC_OR_DEPARTMENT_OTHER): Payer: BLUE CROSS/BLUE SHIELD | Admitting: Hematology and Oncology

## 2017-09-13 ENCOUNTER — Telehealth: Payer: Self-pay | Admitting: Oncology

## 2017-09-13 ENCOUNTER — Encounter: Payer: Self-pay | Admitting: Hematology and Oncology

## 2017-09-13 VITALS — BP 119/94 | HR 88 | Temp 98.5°F | Resp 13 | Ht 59.0 in | Wt 165.4 lb

## 2017-09-13 DIAGNOSIS — Z452 Encounter for adjustment and management of vascular access device: Secondary | ICD-10-CM | POA: Diagnosis not present

## 2017-09-13 DIAGNOSIS — C541 Malignant neoplasm of endometrium: Secondary | ICD-10-CM

## 2017-09-13 DIAGNOSIS — Z9221 Personal history of antineoplastic chemotherapy: Secondary | ICD-10-CM | POA: Diagnosis not present

## 2017-09-13 DIAGNOSIS — R530 Neoplastic (malignant) related fatigue: Secondary | ICD-10-CM | POA: Diagnosis not present

## 2017-09-13 DIAGNOSIS — Z79899 Other long term (current) drug therapy: Secondary | ICD-10-CM | POA: Diagnosis not present

## 2017-09-13 DIAGNOSIS — D61818 Other pancytopenia: Secondary | ICD-10-CM

## 2017-09-13 DIAGNOSIS — T451X5A Adverse effect of antineoplastic and immunosuppressive drugs, initial encounter: Secondary | ICD-10-CM

## 2017-09-13 DIAGNOSIS — Z923 Personal history of irradiation: Secondary | ICD-10-CM

## 2017-09-13 DIAGNOSIS — G620 Drug-induced polyneuropathy: Secondary | ICD-10-CM

## 2017-09-13 NOTE — Progress Notes (Signed)
[place] OFFICE PROGRESS NOTE  Patient Care Team: Rutherford Guys, MD as PCP - General (Family Medicine) Skeet Latch, MD as PCP - Cardiology (Cardiology)  ASSESSMENT & PLAN:  Endometrial cancer [facility]) I have review all test results with the patient and her daughter She has no evidence of cancer She is still mildly pancytopenic but will likely recover over the next month or so We discussed future follow-up She has appointment to see radiation oncologist I will get her scheduled to see GYN oncologist I will get her port removed  Pancytopenia, acquired [facility]) She has mild persistent pancytopenia but not symptomatic I suspect her pancytopenia will recover in time   Peripheral neuropathy due to chemotherapy [facility]) She has mild neuropathy from treatment She is on treatment for this Again, I suspect she will recover in 6 months to a year We discussed cancer rehab referral for strengthening exercise but the patient declined   Orders Placed This Encounter  Procedures  . IR REMOVAL TUN ACCESS W/ PORT W/O FL MOD SED    Standing Status:   Future    Standing Expiration Date:   11/14/2018    Order Specific Question:   Reason for exam:    Answer:   no need port, chemo is completed    Order Specific Question:   Preferred Imaging Location?    Answer:   [facility]    INTERVAL HISTORY: Please see below for problem oriented charting. She returns with her daughter for further follow-up She continues to have excessive fatigue, mild neuropathy and feeling unwell Denies abnormal abdominal bloating, nausea or changes in bowel habits No new neurological deficit  SUMMARY OF ONCOLOGIC HISTORY: Oncology History   Endometrioid with focal squamous  MSI stable     Endometrial cancer ([place])   02/20/2017 Pathology Results    Endometrium, biopsy - ENDOMETRIOID ADENOCARCINOMA, FIGO GRADE I, WITH SQUAMOUS DIFFERENTIATION. - SEE COMMENT    02/20/2017 Initial Diagnosis     Initially presented with PMB    03/26/2017 Pathology Results    1. Lymph node, sentinel, biopsy, right external iliac - LYMPH NODE, NEGATIVE FOR CARCINOMA (0/1). SEE NOTE 2. Lymph node, sentinel, biopsy, left external iliac - FOCUS OF METASTATIC CARCINOMA TO ONE LYMPH NODE, 0.3 MM (1/1). SEE NOTE 3. Uterus +/- tubes/ovaries, neoplastic, cervix - ENDOMETRIOID ADENOCARCINOMA WITH FOCAL SQUAMOUS DIFFERENTIATION, 3.5 CM, FIGO GRADE I, INVOLVING ANTERIOR AND POSTERIOR ENDOMETRIUM - TUMOR INVADES MORE THAN 90% OF MYOMETRIAL THICKNESS - LYMPHOVASCULAR INVASION IS IDENTIFIED - TUMOR INTRALUMINALLY EXTENDS INTO THE RIGHT FALLOPIAN TUBE; LEFT FALLOPIAN TUBE AND BILATERAL OVARIES ARE UNREMARKABLE - SEE ONCOLOGY TABLE Microscopic Comment 3. ONCOLOGY TABLE-UTERUS, CARCINOMA OR CARCINOSARCOMA  Specimen: Uterus, tubes, ovaries and cervix Procedure: Total hysterectomy and bilateral salpingo-oophorectomy Lymph node sampling performed: Yes Specimen integrity: Intact Maximum tumor size: 3.5 cm Histologic type: Endometrioid adenocarcinoma with focal squamous differentiation Grade: I Myometrial invasion: 1.4 cm where myometrium is 1.5 cm in thickness Uterine Serosa Involvement: Not identified Cervical stromal involvement: Not identified Extent of involvement of other organs: Right fallopian tube Lymph - vascular invasion: Present Lymph nodes: Examined: 2 Sentinel 0 Non-sentinel 2 Total Lymph nodes with metastasis: 1 Isolated tumor cells (< 0.2 mm): 0 TNM code: pT1b, pN66m FIGO Stage (based on pathologic findings, needs clinical correlation): IIIC    03/26/2017 Surgery    Pre-operative Diagnosis: endometrial cancer grade 1  Operation: Robotic-assisted laparoscopic total hysterectomy with bilateral salpingoophorectomy, SLN biopsy   Surgeon: RDonaciano Eva Operative Findings:  :  normal 7cm uterus, tubes and ovaries, normal appearing nodes, no apparent extrauterine disease       04/11/2017 Cancer Staging    Staging form: Corpus Uteri - Carcinoma and Carcinosarcoma, AJCC 8th Edition - Pathologic: FIGO Stage IIIC1 (pT3a, pN1, cM0) - Signed by Heath Lark, MD on 04/11/2017    04/24/2017 Procedure    Successful placement of a right IJ approach Power Port with ultrasound and fluoroscopic guidance. The catheter is ready for use.    04/25/2017 - 08/12/2017 Chemotherapy    The patient had carboplatin and Taxol for chemotherapy treatment.      05/23/2017 - 06/20/2017 Radiation Therapy    Radiation treatment dates:   05/23/2017 - 06/20/2017  Site/dose:   Vaginal cuff / 30 Gy delivered in 5 fractions of 6 Gy  Beams/energy:  HDR Ir-192 Vaginal/ 2.5 cm diameter segmented cylinder with treatment length of 3.5 cm. prescription to the vaginal mucosal surface     06/06/2017 Genetic Testing    The Common Hereditary Cancer Panel offered by [facility] includes sequencing and/or deletion duplication testing of the following 47 genes: APC, ATM, AXIN2, BARD1, BMPR1A, BRCA1, BRCA2, BRIP1, CDH1, CDKN2A (p14ARF), CDKN2A (p16INK4a), CKD4, CHEK2, CTNNA1, DICER1, EPCAM (Deletion/duplication testing only), GREM1 (promoter region deletion/duplication testing only), KIT, MEN1, MLH1, MSH2, MSH3, MSH6, MUTYH, NBN, NF1, NHTL1, PALB2, PDGFRA, PMS2, POLD1, POLE, PTEN, RAD50, RAD51C, RAD51D, SDHB, SDHC, SDHD, SMAD4, SMARCA4. STK11, TP53, TSC1, TSC2, and VHL.  The following genes were evaluated for sequence changes only: SDHA and HOXB13 c.251G>A variant only.  Results: Negative, No pathogenic variants identified. The date of this test report is 06/06/2017.     09/11/2017 Imaging    1. Status post hysterectomy. No findings for recurrent tumor in the pelvis or pelvic adenopathy. 2. Small scattered abdominal nodules/lymph nodes are stable to slightly smaller. No new or progressive findings. 3. No evidence of peritoneal surface disease involving the solid abdominal organs.     REVIEW OF SYSTEMS:   Constitutional:  Denies fevers, chills or abnormal weight loss Eyes: Denies blurriness of vision Ears, nose, mouth, throat, and face: Denies mucositis or sore throat Respiratory: Denies cough, dyspnea or wheezes Cardiovascular: Denies palpitation, chest discomfort or lower extremity swelling Gastrointestinal:  Denies nausea, heartburn or change in bowel habits Skin: Denies abnormal skin rashes Lymphatics: Denies new lymphadenopathy or easy bruising Neurological:Denies numbness, tingling or new weaknesses Behavioral/Psych: Mood is stable, no new changes  All other systems were reviewed with the patient and are negative.  I have reviewed the past medical history, past surgical history, social history and family history with the patient and they are unchanged from previous note.  ALLERGIES:  is allergic to [employer] tartrate] and soma [carisoprodol].  MEDICATIONS:  Current Outpatient Medications  Medication Sig Dispense Refill  . citalopram (CELEXA) 20 MG tablet Take 1 tablet (20 mg total) by mouth daily. 90 tablet 0  . diphenhydramine-acetaminophen (TYLENOL PM) 25-500 MG TABS tablet Take 1 tablet by mouth at bedtime.    . DULoxetine (CYMBALTA) 60 MG capsule TAKE 2 CAPSULES BY MOUTH ONCE DAILY 180 capsule 0  . levETIRAcetam (KEPPRA) 1000 MG tablet Take 2,000 mg by mouth 2 (two) times daily.    Marland Kitchen LORazepam (ATIVAN) 0.5 MG tablet Take 1 tablet (0.5 mg total) by mouth every 8 (eight) hours as needed for anxiety. 60 tablet 0  . meloxicam (MOBIC) 7.5 MG tablet Take 1 tablet (7.5 mg total) by mouth daily. 30 tablet 1  . Multiple Vitamin (MULTIVITAMIN) capsule Take 1 capsule by  mouth daily.    . ondansetron (ZOFRAN) 8 MG tablet Take 1 tablet (8 mg total) by mouth every 8 (eight) hours as needed for refractory nausea / vomiting. Start on day 3 after chemo. 30 tablet 1  . prochlorperazine (COMPAZINE) 10 MG tablet Take 1 tablet (10 mg total) by mouth every 6 (six) hours as needed (Nausea or vomiting). 30 tablet  1  . ranitidine (ZANTAC) 150 MG capsule Take 1 capsule (150 mg total) by mouth 2 (two) times daily. 60 capsule 1   No current facility-administered medications for this visit.     PHYSICAL EXAMINATION: ECOG PERFORMANCE STATUS: 1 - Symptomatic but completely ambulatory  Vitals:   09/13/17 0826  BP: (!) 119/94  Pulse: 88  Resp: 13  Temp: 98.5 F (36.9 C)  SpO2: 97%   Filed Weights   09/13/17 0826  Weight: 165 lb 6.4 oz (75 kg)    GENERAL:alert, no distress and comfortable NEURO: alert & oriented x 3 with fluent speech, no focal motor/sensory deficits  LABORATORY DATA:  I have reviewed the data as listed    Component Value Date/Time   NA 140 09/11/2017 0848   NA 140 11/07/2016 1551   K 3.8 09/11/2017 0848   CL 103 09/11/2017 0848   CO2 28 09/11/2017 0848   GLUCOSE 97 09/11/2017 0848   BUN 11 09/11/2017 0848   BUN 11 11/07/2016 1551   CREATININE 0.80 09/11/2017 0848   CREATININE 0.79 12/02/2015 1146   CALCIUM 9.2 09/11/2017 0848   PROT 7.0 09/11/2017 0848   PROT 6.7 11/07/2016 1551   ALBUMIN 4.0 09/11/2017 0848   ALBUMIN 4.4 11/07/2016 1551   AST 21 09/11/2017 0848   ALT 12 09/11/2017 0848   ALKPHOS 80 09/11/2017 0848   BILITOT 0.3 09/11/2017 0848   GFRNONAA >60 09/11/2017 0848   GFRAA >60 09/11/2017 0848    No results found for: SPEP, UPEP  Lab Results  Component Value Date   WBC 3.2 (L) 09/11/2017   NEUTROABS 1.3 (L) 09/11/2017   HGB 10.9 (L) 09/11/2017   HCT 31.9 (L) 09/11/2017   MCV 111.2 (H) 09/11/2017   PLT 158 09/11/2017      Chemistry      Component Value Date/Time   NA 140 09/11/2017 0848   NA 140 11/07/2016 1551   K 3.8 09/11/2017 0848   CL 103 09/11/2017 0848   CO2 28 09/11/2017 0848   BUN 11 09/11/2017 0848   BUN 11 11/07/2016 1551   CREATININE 0.80 09/11/2017 0848   CREATININE 0.79 12/02/2015 1146      Component Value Date/Time   CALCIUM 9.2 09/11/2017 0848   ALKPHOS 80 09/11/2017 0848   AST 21 09/11/2017 0848   ALT 12  09/11/2017 0848   BILITOT 0.3 09/11/2017 0848       RADIOGRAPHIC STUDIES: I have review of CT imaging with the patient and daughter I have personally reviewed the radiological images as listed and agreed with the findings in the report. Ct Abdomen Pelvis W Contrast  Result Date: 09/11/2017 CLINICAL DATA:  Endometrial cancer status post chemotherapy and radiation therapy. EXAM: CT ABDOMEN AND PELVIS WITH CONTRAST TECHNIQUE: Multidetector CT imaging of the abdomen and pelvis was performed using the standard protocol following bolus administration of intravenous contrast. CONTRAST:  155m OMNIPAQUE IOHEXOL 300 MG/ML  SOLN COMPARISON:  CT scan 04/16/2017 FINDINGS: Lower chest: The lung bases are clear. No worrisome pulmonary nodules. The heart is normal in size. No pericardial effusion. The Port-A-Cath is in good  position. Hepatobiliary: No focal hepatic lesions or peritoneal surface lesions. Gallbladder is normal. No common bile duct dilatation. Pancreas: No mass, inflammation or ductal dilatation. Spleen: Normal size.  No focal lesions. Adrenals/Urinary Tract: The adrenal glands are normal. Stable left renal cyst. No worrisome renal lesions. Stomach/Bowel: The stomach, duodenum, small bowel and colon are unremarkable. No acute inflammatory changes, mass lesions or obstructive findings. Vascular/Lymphatic: Small pericolonic and perirenal lymph nodes are again identified. 6 mm peri cecal nodule on image number 48 is stable. 7 mm nodule anterior to the right kidney on image number 42 previously measured 8 mm. 4 mm pericolonic lymph node on image number 40 previously measured 5 mm. No new or progressive findings. The vascular structures are unremarkable and stable. Reproductive: Status post hysterectomy and bilateral oophorectomy. Other: No pelvic mass or pelvic lymphadenopathy. No inguinal mass or adenopathy. Musculoskeletal: No significant bony findings. IMPRESSION: 1. Status post hysterectomy. No findings for  recurrent tumor in the pelvis or pelvic adenopathy. 2. Small scattered abdominal nodules/lymph nodes are stable to slightly smaller. No new or progressive findings. 3. No evidence of peritoneal surface disease involving the solid abdominal organs. Electronically Signed   By: Marijo Sanes M.D.   On: 09/11/2017 15:55    All questions were answered. The patient knows to call the clinic with any problems, questions or concerns. No barriers to learning was detected.  I spent 10 minutes counseling the patient face to face. The total time spent in the appointment was 15 minutes and more than 50% was on counseling and review of test results  Heath Lark, MD 09/13/2017 9:24 AM

## 2017-09-13 NOTE — Assessment & Plan Note (Signed)
She has mild neuropathy from treatment She is on treatment for this Again, I suspect she will recover in 6 months to a year We discussed cancer rehab referral for strengthening exercise but the patient declined

## 2017-09-13 NOTE — Assessment & Plan Note (Signed)
I have review all test results with the patient and her daughter She has no evidence of cancer She is still mildly pancytopenic but will likely recover over the next month or so We discussed future follow-up She has appointment to see radiation oncologist I will get her scheduled to see GYN oncologist I will get her port removed

## 2017-09-13 NOTE — Assessment & Plan Note (Signed)
She has mild persistent pancytopenia but not symptomatic I suspect her pancytopenia will recover in time

## 2017-09-13 NOTE — Telephone Encounter (Signed)
Called patient and advised her of appointment with Dr. Denman George on 10/25/17 at 3:00 pm.  She verbalized understanding and agreement.

## 2017-09-16 ENCOUNTER — Encounter: Payer: Self-pay | Admitting: Family Medicine

## 2017-09-17 NOTE — Telephone Encounter (Signed)
Needs appt for OV

## 2017-09-25 DIAGNOSIS — C541 Malignant neoplasm of endometrium: Secondary | ICD-10-CM | POA: Diagnosis not present

## 2017-10-01 ENCOUNTER — Ambulatory Visit: Payer: BLUE CROSS/BLUE SHIELD | Admitting: Family Medicine

## 2017-10-03 ENCOUNTER — Encounter: Payer: Self-pay | Admitting: Family Medicine

## 2017-10-03 ENCOUNTER — Ambulatory Visit: Payer: BLUE CROSS/BLUE SHIELD | Admitting: Family Medicine

## 2017-10-03 ENCOUNTER — Other Ambulatory Visit: Payer: Self-pay

## 2017-10-03 VITALS — BP 123/89 | HR 99 | Temp 99.6°F | Resp 18 | Ht 59.0 in | Wt 164.0 lb

## 2017-10-03 DIAGNOSIS — R0781 Pleurodynia: Secondary | ICD-10-CM | POA: Diagnosis not present

## 2017-10-03 DIAGNOSIS — Y92009 Unspecified place in unspecified non-institutional (private) residence as the place of occurrence of the external cause: Secondary | ICD-10-CM

## 2017-10-03 DIAGNOSIS — W19XXXA Unspecified fall, initial encounter: Secondary | ICD-10-CM

## 2017-10-03 NOTE — Progress Notes (Signed)
Chief Complaint  Patient presents with  . fell last Monday over child's gate and bruised left arm and     HPI  Pt reports that she fell over the child gate at home and bruised her left arm, her left ribs and her leg She states that she gets soreness when she takes a deep breath She states that her bruising has improved She states that she also has pain below the left scapula   Past Medical History:  Diagnosis Date  . Anxiety   . Arthritis   . Asthma    NO INHALERS USED  . Cancer ([place])    ENDOMETRIAL  . Depression   . Family history of breast cancer   . Family history of colon cancer   . Fibromyalgia   . GERD (gastroesophageal reflux disease)   . Insomnia   . Seizure disorder (Rebekah [name])    LAST SEIZURE FEBRUARY 2019 SEES DR [facility]   . Seizures ([place])     Current Outpatient Medications  Medication Sig Dispense Refill  . citalopram (CELEXA) 20 MG tablet Take 1 tablet (20 mg total) by mouth daily. 90 tablet 0  . diphenhydramine-acetaminophen (TYLENOL PM) 25-500 MG TABS tablet Take 1 tablet by mouth at bedtime.    . DULoxetine (CYMBALTA) 60 MG capsule TAKE 2 CAPSULES BY MOUTH ONCE DAILY 180 capsule 0  . levETIRAcetam (KEPPRA) 1000 MG tablet Take 2,000 mg by mouth 2 (two) times daily.    Marland Kitchen LORazepam (ATIVAN) 0.5 MG tablet Take 1 tablet (0.5 mg total) by mouth every 8 (eight) hours as needed for anxiety. 60 tablet 0  . meloxicam (MOBIC) 7.5 MG tablet Take 1 tablet (7.5 mg total) by mouth daily. 30 tablet 1  . Multiple Vitamin (MULTIVITAMIN) capsule Take 1 capsule by mouth daily.    . ondansetron (ZOFRAN) 8 MG tablet Take 1 tablet (8 mg total) by mouth every 8 (eight) hours as needed for refractory nausea / vomiting. Start on day 3 after chemo. 30 tablet 1  . prochlorperazine (COMPAZINE) 10 MG tablet Take 1 tablet (10 mg total) by mouth every 6 (six) hours as needed (Nausea or vomiting). 30 tablet 1  . ranitidine (ZANTAC) 150 MG capsule Take 1 capsule (150 mg total)  by mouth 2 (two) times daily. 60 capsule 1   No current facility-administered medications for this visit.     Allergies:  Allergies  Allergen Reactions  . Ambien [Zolpidem Tartrate] Other (See Comments)    Other reaction(s): Hallucination  . Soma [Carisoprodol] Other (See Comments)    chills    Past Surgical History:  Procedure Laterality Date  . ABDOMINAL HYSTERECTOMY    . BREAST BIOPSY Right    biopsy in the 1990's  . BUNIONECTOMY Bilateral    2000's - R ONCE and L once  . CARPAL TUNNEL RELEASE Bilateral   . COLONSCOPY    . ENDOMETRIAL BIOPSY  02/2017  . IR FLUORO GUIDE PORT INSERTION RIGHT  04/24/2017  . IR US GUIDE VASC ACCESS RIGHT  04/24/2017  . ROBOTIC ASSISTED TOTAL HYSTERECTOMY WITH BILATERAL SALPINGO OOPHERECTOMY Bilateral 03/26/2017   Procedure: XI ROBOTIC ASSISTED TOTAL HYSTERECTOMY WITH BILATERAL SALPINGO OOPHORECTOMY AND SENTINAL LYMPH NODES;  Surgeon: Everitt Amber, MD;  Location: [facility];  Service: Gynecology;  Laterality: Bilateral;  . TUBAL LIGATION  1984  . TUBAL LIGATION      Social History   Socioeconomic History  . Marital status: Married    Spouse name: Not on file  . Number  of children: 4  . Years of education: 8  . Highest education level: Not on file  Occupational History  . 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 Financial resource strain: Not on file  . Food insecurity:    Worry: Not on file    Inability: Not on file  . Transportation needs:    Medical: Not on file    Non-medical: Not on file  Tobacco Use  . Smoking status: Never Smoker  . Smokeless tobacco: Never Used  Substance and Sexual Activity  . Alcohol use: No    Alcohol/week: 0.0 standard drinks  . Drug use: No  . Sexual activity: Not Currently    Partners: Male    Birth control/protection: Post-menopausal  Lifestyle  . Physical activity:    Days per week: Not on file    Minutes per session: Not on file  . Stress: Not on file  Relationships  . Social connections:    Talks  on phone: Not on file    Gets together: Not on file    Attends religious service: Not on file    Active member of club or organization: Not on file    Attends meetings of clubs or organizations: Not on file    Relationship status: Not on file  Other Topics Concern  . Not on file  Social History Narrative   Lives with daughter, [name]   Caffeine use: coffee/tea/soda daily    Family History  Problem Relation Age of Onset  . Breast cancer Mother 19  . Diabetes Brother   . Hypertension Brother   . Stroke Brother   . Breast cancer Maternal Grandmother        dx >50  . Breast cancer Maternal Aunt        dx <50  . Colon cancer Paternal Aunt   . Cancer Paternal Uncle        type unk  . Breast cancer Maternal Aunt        dx >50  . Cancer Paternal Aunt   . Cancer Paternal Uncle      ROS Review of Systems See HPI Constitution: No fevers or chills No malaise No diaphoresis Skin: No rash or itching Eyes: no blurry vision, no double vision GU: no dysuria or hematuria Neuro: no dizziness or headaches all others reviewed and negative   Objective: Vitals:   10/03/17 1658  BP: 123/89  Pulse: 99  Resp: 18  Temp: 99.6 F (37.6 C)  TempSrc: Oral  SpO2: 97%  Weight: 164 lb (74.4 kg)  Height: 4\' 11"  (1.499 m)    Physical Exam  Constitutional: She is oriented to person, place, and time. She appears well-developed and well-nourished.  HENT:  Head: Normocephalic and atraumatic.  Eyes: Conjunctivae and EOM are normal.  Cardiovascular: Normal rate, regular rhythm and normal heart sounds.  Pulmonary/Chest: Effort normal and breath sounds normal. No stridor. No respiratory distress.    Musculoskeletal:       Back:  Neurological: She is alert and oriented to person, place, and time.  Psychiatric: She has a normal mood and affect. Her behavior is normal. Judgment and thought content normal.    Assessment and Plan Trent was seen today for fell last monday over  child's gate and bruised left arm and .  Diagnoses and all orders for this visit:  Rib pain on left side Pleuritic chest pain -     DG Ribs Unilateral Left; Future Stable vital signs  Will do outpatient  evaluation    Fall at home, initial encounter Discussed fall risks    [place]

## 2017-10-03 NOTE — Patient Instructions (Addendum)
aspercreme with lidocaine Or biofreeze  Address: [address], [place], [place] [address] Phone: [phone]   If you have lab work done today you will be contacted with your lab results within the next 2 weeks.  If you have not heard from [place] then please contact us. The fastest way to get your results is to register for My Chart.   IF you received an x-ray today, you will receive an invoice from [facility]. Please contact [facility] at [phone] with questions or concerns regarding your invoice.   IF you received labwork today, you will receive an invoice from [place]. Please contact [facility] at [phone] with questions or concerns regarding your invoice.   Our billing staff will not be able to assist you with questions regarding bills from these companies.  You will be contacted with the lab results as soon as they are available. The fastest way to get your results is to activate your My Chart account. Instructions are located on the last page of this paperwork. If you have not heard from [place] regarding the results in 2 weeks, please contact this office.

## 2017-10-04 ENCOUNTER — Other Ambulatory Visit: Payer: Self-pay | Admitting: Radiology

## 2017-10-07 ENCOUNTER — Ambulatory Visit (HOSPITAL_COMMUNITY)
Admission: RE | Admit: 2017-10-07 | Discharge: 2017-10-07 | Disposition: A | Discharge status: Home / Self Care | Payer: BLUE CROSS/BLUE SHIELD | Source: Ambulatory Visit | Attending: Hematology and Oncology | Admitting: Hematology and Oncology

## 2017-10-07 ENCOUNTER — Encounter (HOSPITAL_COMMUNITY): Payer: Self-pay

## 2017-10-07 ENCOUNTER — Encounter: Payer: Self-pay | Admitting: Family Medicine

## 2017-10-07 ENCOUNTER — Other Ambulatory Visit: Payer: Self-pay

## 2017-10-07 ENCOUNTER — Ambulatory Visit (HOSPITAL_COMMUNITY)
Admission: RE | Admit: 2017-10-07 | Discharge: 2017-10-07 | Disposition: A | Discharge status: Home / Self Care | Payer: BLUE CROSS/BLUE SHIELD | Source: Ambulatory Visit | Attending: Family Medicine | Admitting: Family Medicine

## 2017-10-07 DIAGNOSIS — Z803 Family history of malignant neoplasm of breast: Secondary | ICD-10-CM | POA: Insufficient documentation

## 2017-10-07 DIAGNOSIS — Z809 Family history of malignant neoplasm, unspecified: Secondary | ICD-10-CM | POA: Diagnosis not present

## 2017-10-07 DIAGNOSIS — Z8249 Family history of ischemic heart disease and other diseases of the circulatory system: Secondary | ICD-10-CM | POA: Diagnosis not present

## 2017-10-07 DIAGNOSIS — Z452 Encounter for adjustment and management of vascular access device: Secondary | ICD-10-CM | POA: Diagnosis not present

## 2017-10-07 DIAGNOSIS — Z90710 Acquired absence of both cervix and uterus: Secondary | ICD-10-CM | POA: Insufficient documentation

## 2017-10-07 DIAGNOSIS — F419 Anxiety disorder, unspecified: Secondary | ICD-10-CM | POA: Diagnosis not present

## 2017-10-07 DIAGNOSIS — M797 Fibromyalgia: Secondary | ICD-10-CM | POA: Insufficient documentation

## 2017-10-07 DIAGNOSIS — Z800 Family history of malignant neoplasm of digestive organs: Secondary | ICD-10-CM | POA: Insufficient documentation

## 2017-10-07 DIAGNOSIS — R569 Unspecified convulsions: Secondary | ICD-10-CM | POA: Insufficient documentation

## 2017-10-07 DIAGNOSIS — Z5111 Encounter for antineoplastic chemotherapy: Secondary | ICD-10-CM | POA: Diagnosis not present

## 2017-10-07 DIAGNOSIS — K219 Gastro-esophageal reflux disease without esophagitis: Secondary | ICD-10-CM | POA: Diagnosis not present

## 2017-10-07 DIAGNOSIS — F329 Major depressive disorder, single episode, unspecified: Secondary | ICD-10-CM | POA: Diagnosis not present

## 2017-10-07 DIAGNOSIS — Z90722 Acquired absence of ovaries, bilateral: Secondary | ICD-10-CM | POA: Insufficient documentation

## 2017-10-07 DIAGNOSIS — Z8542 Personal history of malignant neoplasm of other parts of uterus: Secondary | ICD-10-CM | POA: Insufficient documentation

## 2017-10-07 DIAGNOSIS — M1990 Unspecified osteoarthritis, unspecified site: Secondary | ICD-10-CM | POA: Diagnosis not present

## 2017-10-07 DIAGNOSIS — C541 Malignant neoplasm of endometrium: Secondary | ICD-10-CM

## 2017-10-07 DIAGNOSIS — Z888 Allergy status to other drugs, medicaments and biological substances status: Secondary | ICD-10-CM | POA: Insufficient documentation

## 2017-10-07 DIAGNOSIS — Z79899 Other long term (current) drug therapy: Secondary | ICD-10-CM | POA: Diagnosis not present

## 2017-10-07 DIAGNOSIS — G4700 Insomnia, unspecified: Secondary | ICD-10-CM | POA: Insufficient documentation

## 2017-10-07 DIAGNOSIS — Z823 Family history of stroke: Secondary | ICD-10-CM | POA: Diagnosis not present

## 2017-10-07 DIAGNOSIS — Z9889 Other specified postprocedural states: Secondary | ICD-10-CM | POA: Diagnosis not present

## 2017-10-07 HISTORY — PX: IR REMOVAL TUN ACCESS W/ PORT W/O FL MOD SED: IMG2290

## 2017-10-07 LAB — CBC WITH DIFFERENTIAL/PLATELET
Basophils Absolute: 0 10*3/uL (ref 0.0–0.1)
Basophils Relative: 0 %
Eosinophils Absolute: 0.1 10*3/uL (ref 0.0–0.7)
Eosinophils Relative: 1 %
HCT: 35.3 % — ABNORMAL LOW (ref 36.0–46.0)
HEMOGLOBIN: 11.9 g/dL — AB (ref 12.0–15.0)
LYMPHS ABS: 1.7 10*3/uL (ref 0.7–4.0)
LYMPHS PCT: 39 %
MCH: 36.8 pg — AB (ref 26.0–34.0)
MCHC: 33.7 g/dL (ref 30.0–36.0)
MCV: 109.3 fL — AB (ref 78.0–100.0)
Monocytes Absolute: 0.4 10*3/uL (ref 0.1–1.0)
Monocytes Relative: 9 %
NEUTROS PCT: 51 %
Neutro Abs: 2.2 10*3/uL (ref 1.7–7.7)
Platelets: 238 10*3/uL (ref 150–400)
RBC: 3.23 MIL/uL — AB (ref 3.87–5.11)
RDW: 12.5 % (ref 11.5–15.5)
WBC: 4.3 10*3/uL (ref 4.0–10.5)

## 2017-10-07 LAB — PROTIME-INR
INR: 0.87
Prothrombin Time: 11.7 seconds (ref 11.4–15.2)

## 2017-10-07 MED ORDER — MIDAZOLAM HCL 2 MG/2ML IJ SOLN
INTRAMUSCULAR | Status: AC | PRN
  Administered 2017-10-07 (×2): 1 mg via INTRAVENOUS

## 2017-10-07 MED ORDER — CEFAZOLIN SODIUM-DEXTROSE 2-4 GM/100ML-% IV SOLN
INTRAVENOUS | Status: AC
  Administered 2017-10-07: 2 g via INTRAVENOUS
  Filled 2017-10-07: qty 100

## 2017-10-07 MED ORDER — FENTANYL CITRATE (PF) 100 MCG/2ML IJ SOLN
INTRAMUSCULAR | Status: AC
  Filled 2017-10-07: qty 2

## 2017-10-07 MED ORDER — LIDOCAINE-EPINEPHRINE (PF) 2 %-1:200000 IJ SOLN
INTRAMUSCULAR | Status: AC
  Filled 2017-10-07: qty 20

## 2017-10-07 MED ORDER — LIDOCAINE-EPINEPHRINE (PF) 2 %-1:200000 IJ SOLN
INTRAMUSCULAR | Status: AC | PRN
  Administered 2017-10-07: 10 mL

## 2017-10-07 MED ORDER — CEFAZOLIN SODIUM-DEXTROSE 2-4 GM/100ML-% IV SOLN
2.0000 g | INTRAVENOUS | Status: AC
  Administered 2017-10-07: 2 g via INTRAVENOUS

## 2017-10-07 MED ORDER — SODIUM CHLORIDE 0.9 % IV SOLN
INTRAVENOUS | Status: DC
  Administered 2017-10-07: 11:00:00 via INTRAVENOUS

## 2017-10-07 MED ORDER — FENTANYL CITRATE (PF) 100 MCG/2ML IJ SOLN
INTRAMUSCULAR | Status: AC | PRN
  Administered 2017-10-07 (×2): 50 ug via INTRAVENOUS

## 2017-10-07 MED ORDER — MIDAZOLAM HCL 2 MG/2ML IJ SOLN
INTRAMUSCULAR | Status: AC
  Filled 2017-10-07: qty 2

## 2017-10-07 NOTE — Discharge Instructions (Signed)
Implanted Port Removal, Care After °Refer to this sheet in the next few weeks. These instructions provide you with information about caring for yourself after your procedure. Your health care provider may also give you more specific instructions. Your treatment has been planned according to current medical practices, but problems sometimes occur. Call your health care provider if you have any problems or questions after your procedure. °What can I expect after the procedure? °After the procedure, it is common to have: °· Soreness or pain near your incision. °· Some swelling or bruising near your incision. ° °Follow these instructions at home: °Medicines °· Take over-the-counter and prescription medicines only as told by your health care provider. °· If you were prescribed an antibiotic medicine, take it as told by your health care provider. Do not stop taking the antibiotic even if you start to feel better. °Bathing °· Do not take baths, swim, or use a hot tub until your health care provider approves. Ask your health care provider if you can take showers. You may only be allowed to take sponge baths for bathing. °Incision care °· Follow instructions from your health care provider about how to take care of your incision. Make sure you: °? Wash your hands with soap and water before you change your bandage (dressing). If soap and water are not available, use hand sanitizer. °? Change your dressing as told by your health care provider. °? Keep your dressing dry. °? Leave stitches (sutures), skin glue, or adhesive strips in place. These skin closures may need to stay in place for 2 weeks or longer. If adhesive strip edges start to loosen and curl up, you may trim the loose edges. Do not remove adhesive strips completely unless your health care provider tells you to do that. °· Check your incision area every day for signs of infection. Check for: °? More redness, swelling, or pain. °? More fluid or  blood. °? Warmth. °? Pus or a bad smell. °Driving °· If you received a sedative, do not drive for 24 hours after the procedure. °· If you did not receive a sedative, ask your health care provider when it is safe to drive. °Activity °· Return to your normal activities as told by your health care provider. Ask your health care provider what activities are safe for you. °· Until your health care provider says it is safe: °? Do not lift anything that is heavier than 10 lb (4.5 kg). °? Do not do activities that involve lifting your arms over your head. °General instructions °· Do not use any tobacco products, such as cigarettes, chewing tobacco, and e-cigarettes. Tobacco can delay healing. If you need help quitting, ask your health care provider. °· Keep all follow-up visits as told by your health care provider. This is important. °Contact a health care provider if: °· You have more redness, swelling, or pain around your incision. °· You have more fluid or blood coming from your incision. °· Your incision feels warm to the touch. °· You have pus or a bad smell coming from your incision. °· You have a fever. °· You have pain that is not relieved by your pain medicine. °Get help right away if: °· You have chest pain. °· You have difficulty breathing. °This information is not intended to replace advice given to you by your health care provider. Make sure you discuss any questions you have with your health care provider. °Document Released: 12/13/2014 Document Revised: 06/09/2015 Document Reviewed: 10/06/2014 °Elsevier Interactive Patient   Education © 2018 [employer] Inc[employer] °Moderate Conscious Sedation, Adult, Care After °These instructions provide you with information about caring for yourself after your procedure. Your health care provider may also give you more specific instructions. Your treatment has been planned according to current medical practices, but problems sometimes occur. Call your health care provider if you have  any problems or questions after your procedure. °What can I expect after the procedure? °After your procedure, it is common: °· To feel sleepy for several hours. °· To feel clumsy and have poor balance for several hours. °· To have poor judgment for several hours. °· To vomit if you eat too soon. ° °Follow these instructions at home: °For at least 24 hours after the procedure: ° °· Do not: °? Participate in activities where you could fall or become injured. °? Drive. °? Use heavy machinery. °? Drink alcohol. °? Take sleeping pills or medicines that cause drowsiness. °? Make important decisions or sign legal documents. °? Take care of children on your own. °· Rest. °Eating and drinking °· Follow the diet recommended by your health care provider. °· If you vomit: °? Drink water, juice, or soup when you can drink without vomiting. °? Make sure you have little or no nausea before eating solid foods. °General instructions °· Have a responsible adult stay with you until you are awake and alert. °· Take over-the-counter and prescription medicines only as told by your health care provider. °· If you smoke, do not smoke without supervision. °· Keep all follow-up visits as told by your health care provider. This is important. °Contact a health care provider if: °· You keep feeling nauseous or you keep vomiting. °· You feel light-headed. °· You develop a rash. °· You have a fever. °Get help right away if: °· You have trouble breathing. °This information is not intended to replace advice given to you by your health care provider. Make sure you discuss any questions you have with your health care provider. °Document Released: 10/22/2012 Document Revised: 06/06/2015 Document Reviewed: 04/23/2015 °Elsevier Interactive Patient Education © 2018 Elsevier Inc. ° °

## 2017-10-07 NOTE — Procedures (Signed)
Pre Procedural Dx: Poor venous access Post Procedural Dx: Same  Successful removal of anterior chest wall port-a-cath.  EBL: Minimal  No immediate post procedural complications.   Jay Kamil Hanigan, MD Pager #: [phone]   

## 2017-10-07 NOTE — Progress Notes (Signed)
Chief Complaint: Patient was seen in consultation today for endometrial cancer  Referring Physician(s): Gorsuch,Ni  Supervising Physician: Sandi Mariscal  Patient Status: [facility] - Out-pt  History of Present Illness: Rebekah [name] is a 62 y.o. female with past medical history of anxiety, depression, fibromyalgia, GERD, seizures, and endometrial cancer who is known to Interventional Radiology service from recent Port-A-Cath placement 04/24/17 with Dr. Laurence Ferrari.  Patient reports the Port has functioned well for the duration of her treatment and she has completed therapy. Request for removal sent from Dr. Alvy Bimler.   Patient presents today in her usual state of health.  She does she have some rib tenderness with movement due to a recent fall. Otherwise, no new complaints.  She has been NPO today.  She does not take blood thinners.   Past Medical History:  Diagnosis Date  . Anxiety   . Arthritis   . Asthma    NO INHALERS USED  . Cancer ([place])    ENDOMETRIAL  . Depression   . Family history of breast cancer   . Family history of colon cancer   . Fibromyalgia   . GERD (gastroesophageal reflux disease)   . Insomnia   . Seizure disorder ([place])    LAST SEIZURE FEBRUARY 2019 SEES DR [facility]   . Seizures ([place])     Past Surgical History:  Procedure Laterality Date  . ABDOMINAL HYSTERECTOMY    . BREAST BIOPSY Right    biopsy in the 1990's  . BUNIONECTOMY Bilateral    2000's - R ONCE and L once  . CARPAL TUNNEL RELEASE Bilateral   . COLONSCOPY    . ENDOMETRIAL BIOPSY  02/2017  . IR FLUORO GUIDE PORT INSERTION RIGHT  04/24/2017  . IR US GUIDE VASC ACCESS RIGHT  04/24/2017  . ROBOTIC ASSISTED TOTAL HYSTERECTOMY WITH BILATERAL SALPINGO OOPHERECTOMY Bilateral 03/26/2017   Procedure: XI ROBOTIC ASSISTED TOTAL HYSTERECTOMY WITH BILATERAL SALPINGO OOPHORECTOMY AND SENTINAL LYMPH NODES;  Surgeon: Everitt Amber, MD;  Location: [facility];  Service: Gynecology;  Laterality:  Bilateral;  . TUBAL LIGATION  1984  . TUBAL LIGATION      Allergies: Ambien [zolpidem tartrate] and Soma [carisoprodol]  Medications: Prior to Admission medications   Medication Sig Start Date End Date Taking? Authorizing Provider  citalopram (CELEXA) 20 MG tablet Take 1 tablet (20 mg total) by mouth daily. 08/01/17  Yes Gorsuch, Ni, MD  diphenhydramine-acetaminophen (TYLENOL PM) 25-500 MG TABS tablet Take 1 tablet by mouth at bedtime.   Yes [provider]  DULoxetine (CYMBALTA) 60 MG capsule TAKE 2 CAPSULES BY MOUTH ONCE DAILY 07/08/17  Yes Wardell Honour, MD  levETIRAcetam (KEPPRA) 1000 MG tablet Take 2,000 mg by mouth 2 (two) times daily.   Yes [provider]  LORazepam (ATIVAN) 0.5 MG tablet Take 1 tablet (0.5 mg total) by mouth every 8 (eight) hours as needed for anxiety. 04/19/17  Yes Gorsuch, Ni, MD  meloxicam (MOBIC) 7.5 MG tablet Take 1 tablet (7.5 mg total) by mouth daily. 08/19/17  Yes Rutherford Guys, MD  Multiple Vitamin (MULTIVITAMIN) capsule Take 1 capsule by mouth daily.   Yes [provider]  ondansetron (ZOFRAN) 8 MG tablet Take 1 tablet (8 mg total) by mouth every 8 (eight) hours as needed for refractory nausea / vomiting. Start on day 3 after chemo. 04/19/17  Yes Heath Lark, MD  prochlorperazine (COMPAZINE) 10 MG tablet Take 1 tablet (10 mg total) by mouth every 6 (six) hours as needed (Nausea or  vomiting). 04/19/17  Yes Gorsuch, Ni, MD  ranitidine (ZANTAC) 150 MG capsule Take 1 capsule (150 mg total) by mouth 2 (two) times daily. 08/19/17  Yes Rutherford Guys, MD     Family History  Problem Relation Age of Onset  . Breast cancer Mother 89  . Diabetes Brother   . Hypertension Brother   . Stroke Brother   . Breast cancer Maternal Grandmother        dx >50  . Breast cancer Maternal Aunt        dx <50  . Colon cancer Paternal Aunt   . Cancer Paternal Uncle        type unk  . Breast cancer Maternal Aunt        dx >50  . Cancer Paternal Aunt     . Cancer Paternal Uncle     Social History   Socioeconomic History  . Marital status: Married    Spouse name: Not on file  . Number of children: 4  . Years of education: 8  . Highest education level: Not on file  Occupational History  . 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 Financial resource strain: Not on file  . Food insecurity:    Worry: Not on file    Inability: Not on file  . Transportation needs:    Medical: Not on file    Non-medical: Not on file  Tobacco Use  . Smoking status: Never Smoker  . Smokeless tobacco: Never Used  Substance and Sexual Activity  . Alcohol use: No    Alcohol/week: 0.0 standard drinks  . Drug use: No  . Sexual activity: Not Currently    Partners: Male    Birth control/protection: Post-menopausal  Lifestyle  . Physical activity:    Days per week: Not on file    Minutes per session: Not on file  . Stress: Not on file  Relationships  . Social connections:    Talks on phone: Not on file    Gets together: Not on file    Attends religious service: Not on file    Active member of club or organization: Not on file    Attends meetings of clubs or organizations: Not on file    Relationship status: Not on file  Other Topics Concern  . Not on file  Social History Narrative   Lives with daughter, [name]   Caffeine use: coffee/tea/soda daily     Review of Systems: A 12 point ROS discussed and pertinent positives are indicated in the HPI above.  All other systems are negative.  Review of Systems  Constitutional: Negative for fatigue and fever.  Respiratory: Negative for cough and shortness of breath.   Cardiovascular: Negative for chest pain.  Gastrointestinal: Negative for abdominal pain.  Genitourinary: Negative for dysuria and flank pain.  Musculoskeletal: Negative for back pain.  Psychiatric/Behavioral: Negative for behavioral problems and confusion.    Vital Signs: There were no vitals taken for this  visit.  Physical Exam  Constitutional: She is oriented to person, place, and time. She appears well-developed. No distress.  Neck: Normal range of motion. Neck supple.  Port-A-Cath palpable on right.  Scar well-healed.  Cardiovascular: Normal rate, regular rhythm and normal heart sounds. Exam reveals no gallop and no friction rub.  No murmur heard. Pulmonary/Chest: Effort normal and breath sounds normal. No respiratory distress. She exhibits tenderness (left side, recent fall).  Musculoskeletal: She exhibits tenderness (left chest, recent fall).  Neurological: She is alert and  oriented to person, place, and time.  Skin: Skin is warm and dry. She is not diaphoretic.  Psychiatric: She has a normal mood and affect. Her behavior is normal. Judgment and thought content normal.  Nursing note and vitals reviewed.    MD Evaluation Airway: WNL Heart: WNL Abdomen: WNL Chest/ Lungs: WNL ASA  Classification: 3 Mallampati/Airway Score: Two   Imaging: Ct Abdomen Pelvis W Contrast  Result Date: 09/11/2017 CLINICAL DATA:  Endometrial cancer status post chemotherapy and radiation therapy. EXAM: CT ABDOMEN AND PELVIS WITH CONTRAST TECHNIQUE: Multidetector CT imaging of the abdomen and pelvis was performed using the standard protocol following bolus administration of intravenous contrast. CONTRAST:  171mL OMNIPAQUE IOHEXOL 300 MG/ML  SOLN COMPARISON:  CT scan 04/16/2017 FINDINGS: Lower chest: The lung bases are clear. No worrisome pulmonary nodules. The heart is normal in size. No pericardial effusion. The Port-A-Cath is in good position. Hepatobiliary: No focal hepatic lesions or peritoneal surface lesions. Gallbladder is normal. No common bile duct dilatation. Pancreas: No mass, inflammation or ductal dilatation. Spleen: Normal size.  No focal lesions. Adrenals/Urinary Tract: The adrenal glands are normal. Stable left renal cyst. No worrisome renal lesions. Stomach/Bowel: The stomach, duodenum, small  bowel and colon are unremarkable. No acute inflammatory changes, mass lesions or obstructive findings. Vascular/Lymphatic: Small pericolonic and perirenal lymph nodes are again identified. 6 mm peri cecal nodule on image number 48 is stable. 7 mm nodule anterior to the right kidney on image number 42 previously measured 8 mm. 4 mm pericolonic lymph node on image number 40 previously measured 5 mm. No new or progressive findings. The vascular structures are unremarkable and stable. Reproductive: Status post hysterectomy and bilateral oophorectomy. Other: No pelvic mass or pelvic lymphadenopathy. No inguinal mass or adenopathy. Musculoskeletal: No significant bony findings. IMPRESSION: 1. Status post hysterectomy. No findings for recurrent tumor in the pelvis or pelvic adenopathy. 2. Small scattered abdominal nodules/lymph nodes are stable to slightly smaller. No new or progressive findings. 3. No evidence of peritoneal surface disease involving the solid abdominal organs. Electronically Signed   By: Marijo Sanes M.D.   On: 09/11/2017 15:55    Labs:  CBC: Recent Labs    07/19/17 0844 08/12/17 0835 09/11/17 0848 10/07/17 1034  WBC 3.6* 3.3* 3.2* 4.3  HGB 11.8 11.0* 10.9* 11.9*  HCT 34.6* 32.4* 31.9* 35.3*  PLT 148 114* 158 238    COAGS: Recent Labs    04/24/17 1025 10/07/17 1034  INR 0.92 0.87    BMP: Recent Labs    06/27/17 0902 07/19/17 0844 08/12/17 0835 09/11/17 0848  NA 138 139 141 140  K 3.9 3.9 3.8 3.8  CL 104 102 105 103  CO2 25 30 28 28   GLUCOSE 145* 104* 98 97  BUN 13 11 8 11   CALCIUM 9.8 8.9 9.2 9.2  CREATININE 0.74 0.79 0.75 0.80  GFRNONAA >60 >60 >60 >60  GFRAA >60 >60 >60 >60    LIVER FUNCTION TESTS: Recent Labs    06/27/17 0902 07/19/17 0844 08/12/17 0835 09/11/17 0848  BILITOT 0.3 0.4 0.3 0.3  AST 21 23 19 21   ALT 16 17 16 12   ALKPHOS 88 71 73 80  PROT 7.7 6.8 6.9 7.0  ALBUMIN 4.1 3.8 3.8 4.0    TUMOR MARKERS: No results for input(s): AFPTM,  CEA, CA199, CHROMGRNA in the last 8760 hours.  Assessment and Plan: Patient with past medical history of endometrial cancer presents at the completion of chemotherapy for removal for her Port-A-Cath originally placed  04/24/17 at the request of Dr. Alvy Bimler.  Patient presents today in their usual state of health.  She has been NPO and is not currently on blood thinners.   Risks and benefits of image guided port-a-catheter placement was discussed with the patient including, but not limited to bleeding, infection, pneumothorax, and need for additional procedures.  All of the patient's questions were answered, patient is agreeable to proceed. Consent signed and in chart.  Thank you for this interesting consult.  I greatly enjoyed meeting Lena Gores and look forward to participating in their care.  A copy of this report was sent to the requesting provider on this date.  Electronically Signed: Docia Barrier, PA 10/07/2017, 11:08 AM   I spent a total of    15 Minutes in face to face in clinical consultation, greater than 50% of which was counseling/coordinating care for endometrial cancer.

## 2017-10-14 ENCOUNTER — Encounter: Payer: Self-pay | Admitting: Family Medicine

## 2017-10-19 ENCOUNTER — Other Ambulatory Visit: Payer: Self-pay | Admitting: Family Medicine

## 2017-10-19 DIAGNOSIS — M797 Fibromyalgia: Secondary | ICD-10-CM

## 2017-10-19 DIAGNOSIS — F330 Major depressive disorder, recurrent, mild: Secondary | ICD-10-CM

## 2017-10-23 ENCOUNTER — Other Ambulatory Visit: Payer: Self-pay | Admitting: Family Medicine

## 2017-10-23 DIAGNOSIS — F330 Major depressive disorder, recurrent, mild: Secondary | ICD-10-CM

## 2017-10-23 DIAGNOSIS — M797 Fibromyalgia: Secondary | ICD-10-CM

## 2017-10-23 NOTE — Telephone Encounter (Signed)
Copied from [place] [phone]. Topic: Quick Communication - Rx Refill/Question >> Oct 23, 2017 10:54 AM Leward Quan A wrote: Medication: DULoxetine (CYMBALTA) 60 MG capsule Patient states that she have 2 pills left  Has the patient contacted their pharmacy? Yes.     Preferred Pharmacy (with phone number or street name): [facility] ([address]),  - [place] [phone] (Phone) [phone] (Fax)     Agent: Please be advised that RX refills may take up to 3 business days. We ask that you follow-up with your pharmacy.

## 2017-10-25 ENCOUNTER — Encounter: Payer: Self-pay | Admitting: Family Medicine

## 2017-10-25 ENCOUNTER — Ambulatory Visit: Payer: BLUE CROSS/BLUE SHIELD | Admitting: Gynecologic Oncology

## 2017-11-08 ENCOUNTER — Encounter: Payer: Self-pay | Admitting: Family Medicine

## 2017-11-11 ENCOUNTER — Encounter: Payer: Self-pay | Admitting: Family Medicine

## 2017-11-21 ENCOUNTER — Ambulatory Visit (INDEPENDENT_AMBULATORY_CARE_PROVIDER_SITE_OTHER): Payer: BLUE CROSS/BLUE SHIELD | Admitting: Physician Assistant

## 2017-11-21 ENCOUNTER — Other Ambulatory Visit: Payer: Self-pay

## 2017-11-21 ENCOUNTER — Encounter: Payer: Self-pay | Admitting: Physician Assistant

## 2017-11-21 VITALS — BP 116/84 | HR 100 | Temp 98.2°F | Ht 59.0 in | Wt 170.4 lb

## 2017-11-21 DIAGNOSIS — Z5321 Procedure and treatment not carried out due to patient leaving prior to being seen by health care provider: Secondary | ICD-10-CM

## 2017-11-21 NOTE — Progress Notes (Signed)
LWBS 

## 2017-11-21 NOTE — Patient Instructions (Signed)
° ° ° °  If you have lab work done today you will be contacted with your lab results within the next 2 weeks.  If you have not heard from us then please contact us. The fastest way to get your results is to register for My Chart. ° ° °IF you received an x-ray today, you will receive an invoice from [facility]. Please contact [facility] at [phone] with questions or concerns regarding your invoice.  ° °IF you received labwork today, you will receive an invoice from [facility]. Please contact [facility] at [phone] with questions or concerns regarding your invoice.  ° °Our billing staff will not be able to assist you with questions regarding bills from these companies. ° °You will be contacted with the lab results as soon as they are available. The fastest way to get your results is to activate your My Chart account. Instructions are located on the last page of this paperwork. If you have not heard from us regarding the results in 2 weeks, please contact this office. °  ° ° ° °

## 2017-11-28 NOTE — Progress Notes (Signed)
Chief Complaint  Patient presents with  . Medication Refill    cymbalta- causing buzzing feeling in face, equilibrium is off and it irritates pt and mood is really bad  . pain in neck and right side    when sitting and walking.  Bad ha's this week causing pt to wake up    HPI Medication refill encounter Patient reports that she is out of her cymbalta She states that she feels like her head and her face are buzzing and just feels off  She reports that she has not taking anything else but her usual seizure medication  Depression screen [facility] 2/9 11/29/2017 10/03/2017 08/19/2017 06/20/2017 02/13/2017  Decreased Interest 0 1 0 3 1  Down, Depressed, Hopeless 0 3 0 2 2  PHQ - 2 Score 0 4 0 5 3  Altered sleeping - 1 - 3 3  Tired, decreased energy - 2 - 3 1  Change in appetite - 2 - 3 3  Feeling bad or failure about yourself  - 3 - 3 2  Trouble concentrating - 0 - 1 0  Moving slowly or fidgety/restless - 1 - 3 0  Suicidal thoughts - 0 - 0 0  PHQ-9 Score - 13 - 21 12  Difficult doing work/chores - Somewhat difficult - Not difficult at all Not difficult at all  Some recent data might be hidden   Back Pain- left She reports that she has some pain on her right side in the low back and the gluteus She states that it is a sharp pain States that she   Past Medical History:  Diagnosis Date  . Anxiety   . Arthritis   . Asthma    NO INHALERS USED  . Cancer ([place])    ENDOMETRIAL  . Depression   . Family history of breast cancer   . Family history of colon cancer   . Fibromyalgia   . GERD (gastroesophageal reflux disease)   . Insomnia   . Seizure disorder ([place])    LAST SEIZURE FEBRUARY 2019 SEES DR [facility]   . Seizures ([place])     Current Outpatient Medications  Medication Sig Dispense Refill  . citalopram (CELEXA) 20 MG tablet Take 1 tablet (20 mg total) by mouth daily. 90 tablet 0  . diphenhydramine-acetaminophen (TYLENOL PM) 25-500 MG TABS tablet Take 1 tablet by mouth  at bedtime.    . DULoxetine (CYMBALTA) 60 MG capsule Take 1 capsule (60 mg total) by mouth 2 (two) times daily. 180 capsule 1  . levETIRAcetam (KEPPRA) 1000 MG tablet Take 2,000 mg by mouth 2 (two) times daily.    Marland Kitchen LORazepam (ATIVAN) 0.5 MG tablet Take 1 tablet (0.5 mg total) by mouth every 8 (eight) hours as needed for anxiety. 60 tablet 0  . Multiple Vitamin (MULTIVITAMIN) capsule Take 1 capsule by mouth daily.    . ondansetron (ZOFRAN) 8 MG tablet Take 1 tablet (8 mg total) by mouth every 8 (eight) hours as needed for refractory nausea / vomiting. Start on day 3 after chemo. 30 tablet 1  . prochlorperazine (COMPAZINE) 10 MG tablet Take 1 tablet (10 mg total) by mouth every 6 (six) hours as needed (Nausea or vomiting). 30 tablet 1  . ranitidine (ZANTAC) 150 MG capsule Take 1 capsule (150 mg total) by mouth 2 (two) times daily. 60 capsule 1   No current facility-administered medications for this visit.     Allergies:  Allergies  Allergen Reactions  . Ambien [Zolpidem Tartrate]  Other (See Comments)    Other reaction(s): Hallucination  . Soma [Carisoprodol] Other (See Comments)    chills    Past Surgical History:  Procedure Laterality Date  . ABDOMINAL HYSTERECTOMY    . BREAST BIOPSY Right    biopsy in the 1990's  . BUNIONECTOMY Bilateral    2000's - R ONCE and L once  . CARPAL TUNNEL RELEASE Bilateral   . COLONSCOPY    . ENDOMETRIAL BIOPSY  02/2017  . IR FLUORO GUIDE PORT INSERTION RIGHT  04/24/2017  . IR REMOVAL TUN ACCESS W/ PORT W/O FL MOD SED  10/07/2017  . IR US GUIDE VASC ACCESS RIGHT  04/24/2017  . ROBOTIC ASSISTED TOTAL HYSTERECTOMY WITH BILATERAL SALPINGO OOPHERECTOMY Bilateral 03/26/2017   Procedure: XI ROBOTIC ASSISTED TOTAL HYSTERECTOMY WITH BILATERAL SALPINGO OOPHORECTOMY AND SENTINAL LYMPH NODES;  Surgeon: Everitt Amber, MD;  Location: [facility];  Service: Gynecology;  Laterality: Bilateral;  . TUBAL LIGATION  1984  . TUBAL LIGATION      Social History   Socioeconomic  History  . Marital status: Married    Spouse name: Not on file  . Number of children: 4  . Years of education: 8  . Highest education level: Not on file  Occupational History  . 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 Financial resource strain: Not on file  . Food insecurity:    Worry: Not on file    Inability: Not on file  . Transportation needs:    Medical: Not on file    Non-medical: Not on file  Tobacco Use  . Smoking status: Never Smoker  . Smokeless tobacco: Never Used  Substance and Sexual Activity  . Alcohol use: No    Alcohol/week: 0.0 standard drinks  . Drug use: No  . Sexual activity: Not Currently    Partners: Male    Birth control/protection: Post-menopausal  Lifestyle  . Physical activity:    Days per week: Not on file    Minutes per session: Not on file  . Stress: Not on file  Relationships  . Social connections:    Talks on phone: Not on file    Gets together: Not on file    Attends religious service: Not on file    Active member of club or organization: Not on file    Attends meetings of clubs or organizations: Not on file    Relationship status: Not on file  Other Topics Concern  . Not on file  Social History Narrative   Lives with daughter, [name]   Caffeine use: coffee/tea/soda daily    Family History  Problem Relation Age of Onset  . Breast cancer Mother 47  . Diabetes Brother   . Hypertension Brother   . Stroke Brother   . Breast cancer Maternal Grandmother        dx >50  . Breast cancer Maternal Aunt        dx <50  . Colon cancer Paternal Aunt   . Cancer Paternal Uncle        type unk  . Breast cancer Maternal Aunt        dx >50  . Cancer Paternal Aunt   . Cancer Paternal Uncle      ROS Review of Systems See HPI Constitution: No fevers or chills No malaise No diaphoresis Skin: No rash or itching Eyes: no blurry vision, no double vision GU: no dysuria or hematuria Neuro: no dizziness or headaches * all others  reviewed and negative  Objective: Vitals:   11/29/17 0927  BP: (!) 148/91  Pulse: 84  Resp: 17  Temp: 98.8 F (37.1 C)  TempSrc: Oral  SpO2: 94%  Weight: 168 lb 12.8 oz (76.6 kg)  Height: 4\' 11"  (1.499 m)    Physical Exam  Musculoskeletal:       Back:    Assessment and Plan Glorian was seen today for medication refill and pain in neck and right side.  Diagnoses and all orders for this visit:  Sciatica of right side-  Advised biofreeze Discussed stretching  Fibromyalgia- refilled medication  -     DULoxetine (CYMBALTA) 60 MG capsule; Take 1 capsule (60 mg total) by mouth 2 (two) times daily.  Mild episode of recurrent major depressive disorder (HCC) -     DULoxetine (CYMBALTA) 60 MG capsule; Take 1 capsule (60 mg total) by mouth 2 (two) times daily.  Dark urine -     POCT urinalysis dipstick     Dashley Monts A Sharan Mcenaney

## 2017-11-29 ENCOUNTER — Encounter: Payer: Self-pay | Admitting: Family Medicine

## 2017-11-29 ENCOUNTER — Encounter: Payer: Self-pay | Admitting: Gynecologic Oncology

## 2017-11-29 ENCOUNTER — Other Ambulatory Visit: Payer: Self-pay

## 2017-11-29 ENCOUNTER — Inpatient Hospital Stay: Payer: BLUE CROSS/BLUE SHIELD | Attending: Gynecologic Oncology | Admitting: Gynecologic Oncology

## 2017-11-29 ENCOUNTER — Ambulatory Visit: Payer: BLUE CROSS/BLUE SHIELD | Admitting: Family Medicine

## 2017-11-29 VITALS — BP 148/91 | HR 84 | Temp 98.8°F | Resp 17 | Ht 59.0 in | Wt 168.8 lb

## 2017-11-29 VITALS — BP 136/80 | HR 98 | Temp 98.5°F | Resp 20 | Ht 59.0 in | Wt 168.8 lb

## 2017-11-29 DIAGNOSIS — Z803 Family history of malignant neoplasm of breast: Secondary | ICD-10-CM | POA: Insufficient documentation

## 2017-11-29 DIAGNOSIS — Z9221 Personal history of antineoplastic chemotherapy: Secondary | ICD-10-CM

## 2017-11-29 DIAGNOSIS — F330 Major depressive disorder, recurrent, mild: Secondary | ICD-10-CM | POA: Diagnosis not present

## 2017-11-29 DIAGNOSIS — Z90722 Acquired absence of ovaries, bilateral: Secondary | ICD-10-CM | POA: Insufficient documentation

## 2017-11-29 DIAGNOSIS — C541 Malignant neoplasm of endometrium: Secondary | ICD-10-CM

## 2017-11-29 DIAGNOSIS — M5431 Sciatica, right side: Secondary | ICD-10-CM

## 2017-11-29 DIAGNOSIS — R82998 Other abnormal findings in urine: Secondary | ICD-10-CM | POA: Diagnosis not present

## 2017-11-29 DIAGNOSIS — Z923 Personal history of irradiation: Secondary | ICD-10-CM

## 2017-11-29 DIAGNOSIS — M797 Fibromyalgia: Secondary | ICD-10-CM

## 2017-11-29 DIAGNOSIS — Z90710 Acquired absence of both cervix and uterus: Secondary | ICD-10-CM | POA: Diagnosis not present

## 2017-11-29 DIAGNOSIS — Z760 Encounter for issue of repeat prescription: Secondary | ICD-10-CM

## 2017-11-29 DIAGNOSIS — Z800 Family history of malignant neoplasm of digestive organs: Secondary | ICD-10-CM | POA: Insufficient documentation

## 2017-11-29 LAB — POCT URINALYSIS DIP (MANUAL ENTRY)
BILIRUBIN UA: NEGATIVE mg/dL
Bilirubin, UA: NEGATIVE
Glucose, UA: NEGATIVE mg/dL
Nitrite, UA: NEGATIVE
PROTEIN UA: NEGATIVE mg/dL
SPEC GRAV UA: 1.01 (ref 1.010–1.025)
Urobilinogen, UA: 0.2 E.U./dL
pH, UA: 5.5 (ref 5.0–8.0)

## 2017-11-29 MED ORDER — DULOXETINE HCL 60 MG PO CPEP: 60.0000 mg | ORAL_CAPSULE | Freq: Two times a day (BID) | ORAL | 1 refills | Status: DC

## 2017-11-29 MED FILL — DULoxetine HCL 60 MG CPEP: 60 | 90 days supply | Qty: 180 | Fill #0

## 2017-11-29 NOTE — Progress Notes (Signed)
Follow-up Note: Gyn-Onc  Consult was requested by Dr. Talbert [name] for the evaluation of Rebekah [name] 62 y.o. female  CC:  Chief Complaint  Patient presents with  . Endometrial cancer, FIGO stage IIIC [facility])    Assessment/Plan:  [name]  is a 62 y.o.  year old with stage IIIC1 grade 1 endometrial cancer.  My recommendation is for carboplatin and paclitaxel x 6 cycles.  I recommend vaginal brachy, but due to the apearance of the cuff, would wait until >8weeks postop.  I offered vaginal premarin to facilitate cuff healing, but Rebekah [name] declined due to all of the meds she is on for her impending chemotherapy.  She has some anxiety and depression and is speaking with social work.   HPI: Rebekah [name] is a 62 year old woman who is seen in consultation at the request of Dr Rebekah [name] for endometrial cancer (grade 1).  The patient reports a 1 month history of vaginal bleeding (light). She was seen by Dr Rebekah [name] on 02/20/17 and endometrial pipelle was performed. It revealed a grade 1 endometrial cancer.  The patient has family history of breast cancer on the maternal side. She also has a family history of colon cancer on the paternal family. She has a seizure disorder for which she takes Keppra. She has seizures "every other month". She has had a tubal ligation but no other surgeries on her abdomen. She has had 4 vaginal deliveries.    On March 26, 2017 she underwent a robotic assisted total hysterectomy BSO and sentinel lymph node biopsy.  Surgery was uncomplicated.  Final pathology revealed a FIGO grade 1 endometrioid endometrial adenocarcinoma measuring 3.5 cm.  It demonstrated 90% myometrial invasion with 1.4 of 1.5 cm invasion into the myometrial wall.  Lymphovascular space invasion was present.  Cervical stromal involvement was not identified.  There is metastatic endometrial cancer identified extending into the right fallopian tube.  There is also a focus of  metastatic carcinoma to one pelvic lymph node (the left external iliac lymph node).  This was a 0.3 mm metastasis.  This represented a stage III C1 endometrial cancer.  And, in accordance with [facility] and guidelines, adjuvant systemic chemotherapy with carbo platinum paclitaxel was recommended.  I also recommend vaginal brachii therapy to reduce the risk of vaginal recurrence given the substantial uterine involvement deep myometrial invasion and LV SI.   Interval Hx:  She went on to complete chemotherapy with 6 cycles of carb/taxol between 04/25/17 and 08/12/17 and received vaginal brachytherapy with 30Gy in 5 fractions of 6Gy between 05/23/17-06/20/17.  She tolerated the therapy very well.  Post treatment CT scan of the abdomen and pelvis on September 11, 2017 revealed no residual metastatic or recurrent disease.  Current Meds:  Outpatient Encounter Medications as of 11/29/2017  Medication Sig  . citalopram (CELEXA) 20 MG tablet Take 1 tablet (20 mg total) by mouth daily.  . diphenhydramine-acetaminophen (TYLENOL PM) 25-500 MG TABS tablet Take 1 tablet by mouth at bedtime.  . DULoxetine (CYMBALTA) 60 MG capsule Take 1 capsule (60 mg total) by mouth 2 (two) times daily.  Marland Kitchen levETIRAcetam (KEPPRA) 1000 MG tablet Take 2,000 mg by mouth 2 (two) times daily.  Marland Kitchen LORazepam (ATIVAN) 0.5 MG tablet Take 1 tablet (0.5 mg total) by mouth every 8 (eight) hours as needed for anxiety.  . Multiple Vitamin (MULTIVITAMIN) capsule Take 1 capsule by mouth daily.  . ondansetron (ZOFRAN) 8 MG tablet Take 1 tablet (8 mg total) by mouth every 8 (eight) hours  as needed for refractory nausea / vomiting. Start on day 3 after chemo.  . prochlorperazine (COMPAZINE) 10 MG tablet Take 1 tablet (10 mg total) by mouth every 6 (six) hours as needed (Nausea or vomiting).  . ranitidine (ZANTAC) 150 MG capsule Take 1 capsule (150 mg total) by mouth 2 (two) times daily.   No facility-administered encounter medications on file as of 11/29/2017.      Allergy:  Allergies  Allergen Reactions  . Ambien [Zolpidem Tartrate] Other (See Comments)    Other reaction(s): Hallucination  . Soma [Carisoprodol] Other (See Comments)    chills    Social Hx:   Social History   Socioeconomic History  . Marital status: Married    Spouse name: Not on file  . Number of children: 4  . Years of education: 8  . Highest education level: Not on file  Occupational History  . 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 Financial resource strain: Not on file  . Food insecurity:    Worry: Not on file    Inability: Not on file  . Transportation needs:    Medical: Not on file    Non-medical: Not on file  Tobacco Use  . Smoking status: Never Smoker  . Smokeless tobacco: Never Used  Substance and Sexual Activity  . Alcohol use: No    Alcohol/week: 0.0 standard drinks  . Drug use: No  . Sexual activity: Not Currently    Partners: Male    Birth control/protection: Post-menopausal  Lifestyle  . Physical activity:    Days per week: Not on file    Minutes per session: Not on file  . Stress: Not on file  Relationships  . Social connections:    Talks on phone: Not on file    Gets together: Not on file    Attends religious service: Not on file    Active member of club or organization: Not on file    Attends meetings of clubs or organizations: Not on file    Relationship status: Not on file  . Intimate partner violence:    Fear of current or ex partner: Not on file    Emotionally abused: Not on file    Physically abused: Not on file    Forced sexual activity: Not on file  Other Topics Concern  . Not on file  Social History Narrative   Lives with daughter, Rebekah "Nicole Kindred"   Caffeine use: coffee/tea/soda daily    Past Surgical Hx:  Past Surgical History:  Procedure Laterality Date  . ABDOMINAL HYSTERECTOMY    . BREAST BIOPSY Right    biopsy in the 1990's  . BUNIONECTOMY Bilateral    2000's - R ONCE and L once  . CARPAL TUNNEL RELEASE  Bilateral   . COLONSCOPY    . ENDOMETRIAL BIOPSY  02/2017  . IR FLUORO GUIDE PORT INSERTION RIGHT  04/24/2017  . IR REMOVAL TUN ACCESS W/ PORT W/O FL MOD SED  10/07/2017  . IR US GUIDE VASC ACCESS RIGHT  04/24/2017  . ROBOTIC ASSISTED TOTAL HYSTERECTOMY WITH BILATERAL SALPINGO OOPHERECTOMY Bilateral 03/26/2017   Procedure: XI ROBOTIC ASSISTED TOTAL HYSTERECTOMY WITH BILATERAL SALPINGO OOPHORECTOMY AND SENTINAL LYMPH NODES;  Surgeon: Everitt Amber, MD;  Location: [facility];  Service: Gynecology;  Laterality: Bilateral;  . TUBAL LIGATION  1984  . TUBAL LIGATION      Past Medical Hx:  Past Medical History:  Diagnosis Date  . Anxiety   . Arthritis   . Asthma  NO INHALERS USED  . Cancer ([place])    ENDOMETRIAL  . Depression   . Family history of breast cancer   . Family history of colon cancer   . Fibromyalgia   . GERD (gastroesophageal reflux disease)   . Insomnia   . Seizure disorder ([place])    LAST SEIZURE FEBRUARY 2019 SEES DR [facility]   . Seizures ([place])     Past Gynecological History:  SVD x 4 No LMP recorded. Patient has had a hysterectomy.  Family Hx:  Family History  Problem Relation Age of Onset  . Breast cancer Mother 98  . Diabetes Brother   . Hypertension Brother   . Stroke Brother   . Breast cancer Maternal Grandmother        dx >50  . Breast cancer Maternal Aunt        dx <50  . Colon cancer Paternal Aunt   . Cancer Paternal Uncle        type unk  . Breast cancer Maternal Aunt        dx >50  . Cancer Paternal Aunt   . Cancer Paternal Uncle     Review of Systems:  Constitutional  Feels well,    ENT Normal appearing ears and nares bilaterally Skin/Breast  No rash, sores, jaundice, itching, dryness Cardiovascular  No chest pain, shortness of breath, or edema  Pulmonary  No cough or wheeze.  Gastro Intestinal  No nausea, vomitting, or diarrhoea. No bright red blood per rectum, no abdominal pain, change in bowel movement, or constipation.   Genito Urinary  No frequency, urgency, dysuria, no bleeding, + discharge Musculo Skeletal  No myalgia, arthralgia, joint swelling or pain  Neurologic  No weakness, numbness, change in gait,  Psychology  No depression, anxiety, insomnia.   Vitals:  Blood pressure 136/80, pulse 98, temperature 98.5 F (36.9 C), temperature source Oral, resp. rate 20, height 4\' 11"  (1.499 m), weight 168 lb 12.8 oz (76.6 kg), SpO2 100 %.  Physical Exam: WD in NAD Neck  Supple NROM, without any enlargements.  Lymph Node Survey No cervical supraclavicular or inguinal adenopathy Cardiovascular  Pulse normal rate, regularity and rhythm. S1 and S2 normal.  Lungs  Clear to auscultation bilateraly, without wheezes/crackles/rhonchi. Good air movement.  Skin  No rash/lesions/breakdown  Psychiatry  Alert and oriented to person, place, and time  Abdomen  Normoactive bowel sounds, abdomen soft, non-tender and overweight but not obese without evidence of hernia. Well healed incisions. Soft abdomen Back No CVA tenderness Genito Urinary  Vaginal cuff in tact, smooth, no lesions. Uncomfortable exam for patient.  Rectal  deferred  Extremities  No bilateral cyanosis, clubbing or edema.  Thereasa Solo, MD  11/29/2017, 12:34 PM

## 2017-11-29 NOTE — Addendum Note (Signed)
Addended by: Delia Chimes A on: 11/29/2017 11:38 AM   Modules accepted: Level of Service

## 2017-11-29 NOTE — Patient Instructions (Addendum)
Please rub the hip and low back with biofreeze.    If you have lab work done today you will be contacted with your lab results within the next 2 weeks.  If you have not heard from [place] then please contact us. The fastest way to get your results is to register for My Chart.   IF you received an x-ray today, you will receive an invoice from [facility]. Please contact [facility] at [phone] with questions or concerns regarding your invoice.   IF you received labwork today, you will receive an invoice from [place] [facility]. Please contact [facility] at [phone] with questions or concerns regarding your invoice.   Our billing staff will not be able to assist you with questions regarding bills from these companies.  You will be contacted with the lab results as soon as they are available. The fastest way to get your results is to activate your My Chart account. Instructions are located on the last page of this paperwork. If you have not heard from [place] regarding the results in 2 weeks, please contact this office.     Sciatica Sciatica is pain, numbness, weakness, or tingling along the path of the sciatic nerve. The sciatic nerve starts in the lower back and runs down the back of each leg. The nerve controls the muscles in the lower leg and in the back of the knee. It also provides feeling (sensation) to the back of the thigh, the lower leg, and the sole of the foot. Sciatica is a symptom of another medical condition that pinches or puts pressure on the sciatic nerve. Generally, sciatica only affects one side of the body. Sciatica usually goes away on its own or with treatment. In some cases, sciatica may keep coming back (recur). What are the causes? This condition is caused by pressure on the sciatic nerve, or pinching of the sciatic nerve. This may be the result of:  A disk in between the bones of the spine (vertebrae) bulging out too far (herniated disk).  Age-related changes in  the spinal disks (degenerative disk disease).  A pain disorder that affects a muscle in the buttock (piriformis syndrome).  Extra bone growth (bone spur) near the sciatic nerve.  An injury or break (fracture) of the pelvis.  Pregnancy.  Tumor (rare).  What increases the risk? The following factors may make you more likely to develop this condition:  Playing sports that place pressure or stress on the spine, such as football or weight lifting.  Having poor strength and flexibility.  A history of back injury.  A history of back surgery.  Sitting for long periods of time.  Doing activities that involve repetitive bending or lifting.  Obesity.  What are the signs or symptoms? Symptoms can vary from mild to very severe, and they may include:  Any of these problems in the lower back, leg, hip, or buttock: ? Mild tingling or dull aches. ? Burning sensations. ? Sharp pains.  Numbness in the back of the calf or the sole of the foot.  Leg weakness.  Severe back pain that makes movement difficult.  These symptoms may get worse when you cough, sneeze, or laugh, or when you sit or stand for long periods of time. Being overweight may also make symptoms worse. In some cases, symptoms may recur over time. How is this diagnosed? This condition may be diagnosed based on:  Your symptoms.  A physical exam. Your health care provider may ask you to do certain movements  to check whether those movements trigger your symptoms.  You may have tests, including: ? Blood tests. ? X-rays. ? MRI. ? CT scan.  How is this treated? In many cases, this condition improves on its own, without any treatment. However, treatment may include:  Reducing or modifying physical activity during periods of pain.  Exercising and stretching to strengthen your abdomen and improve the flexibility of your spine.  Icing and applying heat to the affected area.  Medicines that help: ? To relieve pain and  swelling. ? To relax your muscles.  Injections of medicines that help to relieve pain, irritation, and inflammation around the sciatic nerve (steroids).  Surgery.  Follow these instructions at home: Medicines  Take over-the-counter and prescription medicines only as told by your health care provider.  Do not drive or operate heavy machinery while taking prescription pain medicine. Managing pain  If directed, apply ice to the affected area. ? Put ice in a plastic bag. ? Place a towel between your skin and the bag. ? Leave the ice on for 20 minutes, 2-3 times a day.  After icing, apply heat to the affected area before you exercise or as often as told by your health care provider. Use the heat source that your health care provider recommends, such as a moist heat pack or a heating pad. ? Place a towel between your skin and the heat source. ? Leave the heat on for 20-30 minutes. ? Remove the heat if your skin turns bright red. This is especially important if you are unable to feel pain, heat, or cold. You may have a greater risk of getting burned. Activity  Return to your normal activities as told by your health care provider. Ask your health care provider what activities are safe for you. ? Avoid activities that make your symptoms worse.  Take brief periods of rest throughout the day. Resting in a lying or standing position is usually better than sitting to rest. ? When you rest for longer periods, mix in some mild activity or stretching between periods of rest. This will help to prevent stiffness and pain. ? Avoid sitting for long periods of time without moving. Get up and move around at least one time each hour.  Exercise and stretch regularly, as told by your health care provider.  Do not lift anything that is heavier than 10 lb (4.5 kg) while you have symptoms of sciatica. When you do not have symptoms, you should still avoid heavy lifting, especially repetitive heavy  lifting.  When you lift objects, always use proper lifting technique, which includes: ? Bending your knees. ? Keeping the load close to your body. ? Avoiding twisting. General instructions  Use good posture. ? Avoid leaning forward while sitting. ? Avoid hunching over while standing.  Maintain a healthy weight. Excess weight puts extra stress on your back and makes it difficult to maintain good posture.  Wear supportive, comfortable shoes. Avoid wearing high heels.  Avoid sleeping on a mattress that is too soft or too hard. A mattress that is firm enough to support your back when you sleep may help to reduce your pain.  Keep all follow-up visits as told by your health care provider. This is important. Contact a health care provider if:  You have pain that wakes you up when you are sleeping.  You have pain that gets worse when you lie down.  Your pain is worse than you have experienced in the past.  Your pain lasts  longer than 4 weeks.  You experience unexplained weight loss. Get help right away if:  You lose control of your bowel or bladder (incontinence).  You have: ? Weakness in your lower back, pelvis, buttocks, or legs that gets worse. ? Redness or swelling of your back. ? A burning sensation when you urinate. This information is not intended to replace advice given to you by your health care provider. Make sure you discuss any questions you have with your health care provider. Document Released: 12/26/2000 Document Revised: 06/07/2015 Document Reviewed: 09/10/2014 Elsevier Interactive Patient Education  [employer].

## 2017-11-29 NOTE — Patient Instructions (Addendum)
Please notify Dr Denman George at phone number [phone] if you notice vaginal bleeding, new pelvic or abdominal pains, bloating, feeling full easy, or a change in bladder or bowel function.   Please return to see Dr Sondra Come in December as scheduled.  Please return to see Dr Denman George in March, 2020.   Use of the dilator will decrease discomfort of the exams during those visits.

## 2017-11-30 ENCOUNTER — Other Ambulatory Visit: Payer: Self-pay | Admitting: Family Medicine

## 2017-11-30 DIAGNOSIS — M797 Fibromyalgia: Secondary | ICD-10-CM

## 2017-11-30 DIAGNOSIS — F330 Major depressive disorder, recurrent, mild: Secondary | ICD-10-CM

## 2017-12-01 ENCOUNTER — Encounter: Payer: Self-pay | Admitting: Family Medicine

## 2017-12-03 ENCOUNTER — Ambulatory Visit: Payer: Self-pay

## 2017-12-03 NOTE — Telephone Encounter (Signed)
Returned patient call. Pt states she is having Sciatica pain at 8 on 10 scale. She was in on Friday to see Dr Nolon Rod and states Dr told her to use heat. Pt is now stating that the pain travels down her Rt leg and goes up her back. She denies injury. She has been taking motrin for pain.  She states that she has to walk with a limp and it is very painful. Pt denies any recent lifting or exercise. She denies any bowel or bladder issues. Pt refused office visit stating she has her grandchildren.Pt urged to use urgent care if needed.  She is requesting Dr Nolon Rod call in something. Pt was advised to seek help if symptoms continue or worsen. She was advised that Dr Nolon Rod would need to see her for this condition. Care advice read to patient. Pt verbalized understanding. She states she wants to try Aleve for her pain before scheduling an appointment. All cautions read to patient.  Reason for Disposition . [1] SEVERE back pain (e.g., excruciating, unable to do any normal activities) AND [2] not improved 2 hours after pain medicine  Answer Assessment - Initial Assessment Questions 1. ONSET: "When did the pain begin?"      Long time 2. LOCATION: "Where does it hurt?" (upper, mid or lower back)     Rt side lower back 3. SEVERITY: "How bad is the pain?"  (e.g., Scale 1-10; mild, moderate, or severe)   - MILD (1-3): doesn't interfere with normal activities    - MODERATE (4-7): interferes with normal activities or awakens from sleep    - SEVERE (8-10): excruciating pain, unable to do any normal activities      8 4. PATTERN: "Is the pain constant?" (e.g., yes, no; constant, intermittent)      constant 5. RADIATION: "Does the pain shoot into your legs or elsewhere?"     Leg up your back 6. CAUSE:  "What do you think is causing the back pain?"      sciatica 7. BACK OVERUSE:  "Any recent lifting of heavy objects, strenuous work or exercise?"     no 8. MEDICATIONS: "What have you taken so far for the  pain?" (e.g., nothing, acetaminophen, NSAIDS)     ibuprofen 9. NEUROLOGIC SYMPTOMS: "Do you have any weakness, numbness, or problems with bowel/bladder control?"     no 10. OTHER SYMPTOMS: "Do you have any other symptoms?" (e.g., fever, abdominal pain, burning with urination, blood in urine)       no 11. PREGNANCY: "Is there any chance you are pregnant?" (e.g., yes, no; LMP)      No  Protocols used: BACK PAIN-A-AH

## 2017-12-04 ENCOUNTER — Ambulatory Visit: Payer: Self-pay | Admitting: Family Medicine

## 2017-12-05 NOTE — Telephone Encounter (Signed)
Please see note below. 

## 2017-12-08 ENCOUNTER — Encounter: Payer: Self-pay | Admitting: Family Medicine

## 2017-12-08 MED ORDER — IBUPROFEN 800 MG PO TABS: 800.0000 mg | ORAL_TABLET | Freq: Three times a day (TID) | ORAL | 0 refills | Status: DC | PRN

## 2017-12-08 NOTE — Addendum Note (Signed)
Addended by: Delia Chimes A on: 12/08/2017 07:43 PM   Modules accepted: Orders

## 2017-12-09 MED FILL — IBUPROFEN 800 MG TAB: 800 | 20 days supply | Qty: 60 | Fill #0

## 2017-12-13 ENCOUNTER — Encounter: Payer: Self-pay | Admitting: Family Medicine

## 2017-12-24 ENCOUNTER — Telehealth: Payer: Self-pay | Admitting: Radiation Oncology

## 2017-12-24 NOTE — Telephone Encounter (Signed)
Patient left voicemail regarding f/u appt with Dr. Sondra Come.  Called Rad/Onc and they will return patient's call.

## 2017-12-25 ENCOUNTER — Encounter: Payer: Self-pay | Admitting: Family Medicine

## 2017-12-26 ENCOUNTER — Ambulatory Visit: Payer: BLUE CROSS/BLUE SHIELD | Admitting: Radiation Oncology

## 2017-12-30 ENCOUNTER — Ambulatory Visit: Payer: Self-pay

## 2017-12-30 NOTE — Telephone Encounter (Signed)
Ret'd call to pt.  She reported pain right buttock and down into the knee the past 2-3 weeks.  Stated she discussed sx's. in past with PCP, and advised it was related to sciatic nerve.  Today c/o pain in lateral right knee area.  Reported she was on the floor caring for a grandchild, and shifted the weight on the right knee, and felt a "sharp" pain @ 10/10.  Stated that the pain is decreased now, at 2/10.  Denied any buttock or back pain at this time.  Denied any swelling or redness of the right knee or lower extremity.  Does admit to "soreness" of the right calf.  Denied redness or swelling of calf region.  The pt. stated she has been taking Ibuprofen to manage the pain. Recommended appt. with PCP this week.  Stated she is limited to [employer]. or Friday only, due to caring for grandchildren. Advised no available openings except for "same day appts"; explained those appts. cannot be booked until the day prior.  Suggested that she go to [facility] to be seen sooner.  Stated she prefers to call back on Wed., to try to get a Thurs. appt.  Care advice given per protocol.  Encouraged to call back sooner, if symptoms worsen.  Verb. Understanding.     Reason for Disposition . [1] MODERATE pain (e.g., interferes with normal activities, limping) AND [2] present > 3 days  Answer Assessment - Initial Assessment Questions 1. LOCATION and RADIATION: "Where is the pain located?"   on lateral right knee; soreness of right shin   2. QUALITY: "What does the pain feel like?"  (e.g., sharp, dull, aching, burning)     Intermittent sharp pain  3. SEVERITY: "How bad is the pain?" "What does it keep you from doing?"   (Scale 1-10; or mild, moderate, severe)   -  MILD (1-3): doesn't interfere with normal activities    -  MODERATE (4-7): interferes with normal activities (e.g., work or school) or awakens from sleep, limping    -  SEVERE (8-10): excruciating pain, unable to do any normal activities, unable to walk    2/10, and increases  to 10/ 10 at times   4. ONSET: "When did the pain start?" "Does it come and go, or is it there all the time?"     2-3 weeks  5. RECURRENT: "Have you had this pain before?" If so, ask: "When, and what happened then?"     No  6. SETTING: "Has there been any recent work, exercise or other activity that involved that part of the body?"      Denied any recent work/ activity that contributed to the right leg pain.  7. AGGRAVATING FACTORS: "What makes the knee pain worse?" (e.g., walking, climbing stairs, running)     Intermittent pain with walking or getting in and out of vehicle, or getting up off floor  8. ASSOCIATED SYMPTOMS: "Is there any swelling or redness of the knee?"    Denied 9. OTHER SYMPTOMS: "Do you have any other symptoms?" (e.g., chest pain, difficulty breathing, fever, calf pain)     Denied swelling; c/o some soreness in calf region  10. PREGNANCY: "Is there any chance you are pregnant?" "When was your last menstrual period?"       N/a  Protocols used: KNEE PAIN-A-AH

## 2018-01-02 ENCOUNTER — Telehealth: Payer: Self-pay | Admitting: *Deleted

## 2018-01-02 ENCOUNTER — Telehealth: Payer: Self-pay

## 2018-01-02 ENCOUNTER — Ambulatory Visit
Admission: RE | Admit: 2018-01-02 | Discharge: 2018-01-02 | Disposition: A | Discharge status: Home / Self Care | Payer: BLUE CROSS/BLUE SHIELD | Source: Ambulatory Visit | Attending: Radiation Oncology | Admitting: Radiation Oncology

## 2018-01-02 ENCOUNTER — Ambulatory Visit: Payer: BLUE CROSS/BLUE SHIELD | Admitting: Family Medicine

## 2018-01-02 NOTE — Telephone Encounter (Signed)
CALLED PATIENT TO RESCHEDULE FU FOR 01-02-18, SPOKE WITH PATIENT AND SHE AGREED TO COME ON 01-16-18 @ 8:30 AM

## 2018-01-02 NOTE — Progress Notes (Unsigned)
Patient ID: Rebekah [name], female    DOB: Mar 31, 1955  Age: 62 y.o. MRN: [id number]  No chief complaint on file.   *  Current allergies, medications, problem list, past/family and social histories reviewed.  Objective:  There were no vitals taken for this visit.  ***  Assessment & Plan:   Assessment: No diagnosis found.    Plan: ***  No orders of the defined types were placed in this encounter.   No orders of the defined types were placed in this encounter.        There are no Patient Instructions on file for this visit.   No follow-ups on file.   [name], MD 01/02/2018

## 2018-01-02 NOTE — Telephone Encounter (Signed)
Pt will need to reschedule appt. Conveyed to pt that this office would contact her back with new appt. Pt verbalized understanding and agreement. Loma Sousa, RN BSN

## 2018-01-03 ENCOUNTER — Ambulatory Visit (INDEPENDENT_AMBULATORY_CARE_PROVIDER_SITE_OTHER): Payer: BLUE CROSS/BLUE SHIELD

## 2018-01-03 ENCOUNTER — Other Ambulatory Visit: Payer: Self-pay

## 2018-01-03 ENCOUNTER — Encounter: Payer: Self-pay | Admitting: Emergency Medicine

## 2018-01-03 ENCOUNTER — Ambulatory Visit (INDEPENDENT_AMBULATORY_CARE_PROVIDER_SITE_OTHER): Payer: BLUE CROSS/BLUE SHIELD | Admitting: Emergency Medicine

## 2018-01-03 VITALS — BP 117/79 | HR 86 | Temp 98.5°F | Resp 16 | Ht 59.75 in | Wt 166.6 lb

## 2018-01-03 DIAGNOSIS — M25561 Pain in right knee: Secondary | ICD-10-CM

## 2018-01-03 DIAGNOSIS — Z8669 Personal history of other diseases of the nervous system and sense organs: Secondary | ICD-10-CM

## 2018-01-03 DIAGNOSIS — Z23 Encounter for immunization: Secondary | ICD-10-CM | POA: Diagnosis not present

## 2018-01-03 MED ORDER — DICLOFENAC SODIUM 75 MG PO TBEC: 75.0000 mg | DELAYED_RELEASE_TABLET | Freq: Two times a day (BID) | ORAL | 0 refills | Status: AC

## 2018-01-03 NOTE — Patient Instructions (Addendum)
If you have lab work done today you will be contacted with your lab results within the next 2 weeks.  If you have not heard from [place] then please contact us. The fastest way to get your results is to register for My Chart.   IF you received an x-ray today, you will receive an invoice from [facility]. Please contact [facility] at [phone] with questions or concerns regarding your invoice.   IF you received labwork today, you will receive an invoice from [facility]. Please contact [facility] at [phone] with questions or concerns regarding your invoice.   Our billing staff will not be able to assist you with questions regarding bills from these companies.  You will be contacted with the lab results as soon as they are available. The fastest way to get your results is to activate your My Chart account. Instructions are located on the last page of this paperwork. If you have not heard from [place] regarding the results in 2 weeks, please contact this office.     Acute Knee Pain, Adult Many things can cause knee pain. Sometimes, knee pain is sudden (acute) and may be caused by damage, swelling, or irritation of the muscles and tissues that support your knee. The pain often goes away on its own with time and rest. If the pain does not go away, tests may be done to find out what is causing the pain. Follow these instructions at home: Pay attention to any changes in your symptoms. Take these actions to relieve your pain. If you have a knee sleeve or brace:   Wear the sleeve or brace as told by your doctor. Remove it only as told by your doctor.  Loosen the sleeve or brace if your toes: ? Tingle. ? Become numb. ? Turn cold and blue.  Keep the sleeve or brace clean.  If the sleeve or brace is not waterproof: ? Do not let it get wet. ? Cover it with a watertight covering when you take a bath or shower. Activity  Rest your knee.  Do not do things that cause  pain.  Avoid activities where both feet leave the ground at the same time (high-impact activities). Examples are running, jumping rope, and doing jumping jacks.  Work with a physical therapist to make a safe exercise program, as told by your doctor. Managing pain, stiffness, and swelling   If told, put ice on the knee: ? Put ice in a plastic bag. ? Place a towel between your skin and the bag. ? Leave the ice on for 20 minutes, 2-3 times a day.  If told, put pressure (compression) on your injured knee to control swelling, give support, and help with discomfort. Compression may be done with an elastic bandage. General instructions  Take all medicines only as told by your doctor.  Raise (elevate) your knee while you are sitting or lying down. Make sure your knee is higher than your heart.  Sleep with a pillow under your knee.  Do not use any products that contain nicotine or tobacco. These include cigarettes, e-cigarettes, and chewing tobacco. These products may slow down healing. If you need help quitting, ask your doctor.  If you are overweight, work with your doctor and a food expert (dietitian) to set goals to lose weight. Being overweight can make your knee hurt more.  Keep all follow-up visits as told by your doctor. This is important. Contact a doctor if:  The knee pain does not  stop.  The knee pain changes or gets worse.  You have a fever along with knee pain.  Your knee feels warm when you touch it.  Your knee gives out or locks up. Get help right away if:  Your knee swells, and the swelling gets worse.  You cannot move your knee.  You have very bad knee pain. Summary  Many things can cause knee pain. The pain often goes away on its own with time and rest.  Your doctor may do tests to find out the cause of the pain.  Pay attention to any changes in your symptoms. Relieve your pain with rest, medicines, light activity, and use of ice.  Get help right away if  you cannot move your knee or your knee pain is very bad. This information is not intended to replace advice given to you by your health care provider. Make sure you discuss any questions you have with your health care provider. Document Released: 03/30/2008 Document Revised: 06/13/2017 Document Reviewed: 06/13/2017 Elsevier Interactive Patient Education  2019 [employer].

## 2018-01-03 NOTE — Progress Notes (Signed)
Patient left without being seen.
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
## 2018-01-07 ENCOUNTER — Telehealth: Payer: Self-pay | Admitting: *Deleted

## 2018-01-07 NOTE — Telephone Encounter (Signed)
CALLED INTERP. JULIE TO HAVE HER INFORM THIS PATIENT THAT HER FU WITH DR. KINARD HAS BEEN MOVED TO 02-20-18 @ 8:30 AM WITH DR. KINARD, JULIE STATED THAT SHE WOULD BE GLAD TO DO THIS

## 2018-01-08 ENCOUNTER — Encounter: Payer: Self-pay | Admitting: Family Medicine

## 2018-01-08 DIAGNOSIS — M1990 Unspecified osteoarthritis, unspecified site: Secondary | ICD-10-CM

## 2018-01-16 ENCOUNTER — Ambulatory Visit: Payer: BLUE CROSS/BLUE SHIELD | Admitting: Radiation Oncology

## 2018-02-01 ENCOUNTER — Encounter: Payer: Self-pay | Admitting: Family Medicine

## 2018-02-04 ENCOUNTER — Encounter: Payer: Self-pay | Admitting: Family Medicine

## 2018-02-10 ENCOUNTER — Encounter: Payer: Self-pay | Admitting: Family Medicine

## 2018-02-11 ENCOUNTER — Other Ambulatory Visit: Payer: Self-pay | Admitting: *Deleted

## 2018-02-11 ENCOUNTER — Encounter: Payer: Self-pay | Admitting: Family Medicine

## 2018-02-11 MED ORDER — IBUPROFEN 800 MG PO TABS: 800.0000 mg | ORAL_TABLET | Freq: Three times a day (TID) | ORAL | 0 refills | Status: DC | PRN

## 2018-02-11 MED FILL — IBUPROFEN 800 MG TAB: 800 | 20 days supply | Qty: 60 | Fill #0

## 2018-02-12 DIAGNOSIS — Z0271 Encounter for disability determination: Secondary | ICD-10-CM

## 2018-02-16 ENCOUNTER — Other Ambulatory Visit (HOSPITAL_COMMUNITY): Payer: Self-pay | Admitting: Gynecologic Oncology

## 2018-02-16 ENCOUNTER — Encounter: Payer: Self-pay | Admitting: Family Medicine

## 2018-02-20 ENCOUNTER — Encounter: Payer: Self-pay | Admitting: Family Medicine

## 2018-02-20 ENCOUNTER — Telehealth: Payer: Self-pay | Admitting: *Deleted

## 2018-02-20 ENCOUNTER — Other Ambulatory Visit: Payer: Self-pay

## 2018-02-20 ENCOUNTER — Ambulatory Visit
Admission: RE | Admit: 2018-02-20 | Discharge: 2018-02-20 | Disposition: A | Discharge status: Home / Self Care | Payer: BLUE CROSS/BLUE SHIELD | Source: Ambulatory Visit | Attending: Radiation Oncology | Admitting: Radiation Oncology

## 2018-02-20 ENCOUNTER — Encounter: Payer: Self-pay | Admitting: Radiation Oncology

## 2018-02-20 VITALS — BP 124/85 | HR 81 | Temp 98.4°F | Resp 20 | Ht 59.0 in | Wt 167.0 lb

## 2018-02-20 DIAGNOSIS — M79642 Pain in left hand: Secondary | ICD-10-CM | POA: Diagnosis not present

## 2018-02-20 DIAGNOSIS — Z08 Encounter for follow-up examination after completed treatment for malignant neoplasm: Secondary | ICD-10-CM | POA: Diagnosis not present

## 2018-02-20 DIAGNOSIS — M791 Myalgia, unspecified site: Secondary | ICD-10-CM | POA: Diagnosis not present

## 2018-02-20 DIAGNOSIS — M797 Fibromyalgia: Secondary | ICD-10-CM | POA: Diagnosis not present

## 2018-02-20 DIAGNOSIS — M064 Inflammatory polyarthropathy: Secondary | ICD-10-CM | POA: Diagnosis not present

## 2018-02-20 DIAGNOSIS — M06041 Rheumatoid arthritis without rheumatoid factor, right hand: Secondary | ICD-10-CM | POA: Diagnosis not present

## 2018-02-20 DIAGNOSIS — M2550 Pain in unspecified joint: Secondary | ICD-10-CM | POA: Diagnosis not present

## 2018-02-20 DIAGNOSIS — M25519 Pain in unspecified shoulder: Secondary | ICD-10-CM | POA: Diagnosis not present

## 2018-02-20 DIAGNOSIS — M79641 Pain in right hand: Secondary | ICD-10-CM | POA: Diagnosis not present

## 2018-02-20 DIAGNOSIS — M79643 Pain in unspecified hand: Secondary | ICD-10-CM | POA: Diagnosis not present

## 2018-02-20 DIAGNOSIS — C530 Malignant neoplasm of endocervix: Secondary | ICD-10-CM

## 2018-02-20 DIAGNOSIS — M06042 Rheumatoid arthritis without rheumatoid factor, left hand: Secondary | ICD-10-CM | POA: Diagnosis not present

## 2018-02-20 DIAGNOSIS — Z8542 Personal history of malignant neoplasm of other parts of uterus: Secondary | ICD-10-CM | POA: Diagnosis not present

## 2018-02-20 DIAGNOSIS — C541 Malignant neoplasm of endometrium: Secondary | ICD-10-CM

## 2018-02-20 DIAGNOSIS — M542 Cervicalgia: Secondary | ICD-10-CM | POA: Diagnosis not present

## 2018-02-20 NOTE — Progress Notes (Addendum)
Patient denies any dysuria or hematuria.  Patient also denies any diarrhea or constipation.  Vitals:   02/20/18 0834  BP: 124/85  Pulse: 81  Resp: 20  Temp: 98.4 F (36.9 C)  SpO2: 97%    This SmartLink has not been configured with any valid records.    Filed Weights   02/20/18 0834  Weight: 167 lb (75.8 kg)

## 2018-02-20 NOTE — Telephone Encounter (Signed)
Shirley from radiation called and scheduled an app for the patient. Appt scheduled for the patient on May 6th at 2:15pm. Enid Derry will call the patient

## 2018-02-20 NOTE — Progress Notes (Signed)
Radiation Oncology         [phone] ________________________________  Name: Rebekah [name] MRN: [id number]  Date: 02/20/2018  DOB: 1955-03-24  Follow-Up Visit Note  CC: Forrest Moron, MD  Everitt Amber, MD    ICD-10-CM   1. Endometrial cancer (HCC) C54.1     Diagnosis:   63 y.o. female with Stage IIIC1, grade 1 endometrial cancer  Interval Since Last Radiation:  8 months   Radiation treatment dates:05/23/2017-06/20/2017  Site/dose:Pelvis /30 Gy in 5 fractions, vaginal cuff brachytherapy HDR Ir-192 Vaginal/ 2.5 cm diameter segmented cylinder with treatment length of 3.5 cm.  Narrative:  The patient returns today for routine follow-up.  Since her last visit, she completed chemotherapy on 08/12/2017. Her post-treatment CT scan of the abdomen and pelvis on 09/11/2017 was negative for recurrent tumor or metastatic disease. She was last seen by Dr. Denman George in November with no signs of recurrence on exam.          On review of systems, the patient denies any urinary leakage, dysuria, or hematuria. She denies any nausea, vomiting, diarrhea, or constipation. She denies any pelvic pain or vaginal bleeding.         ALLERGIES:  is allergic to [employer] tartrate] and soma [carisoprodol].  Meds: Current Outpatient Medications  Medication Sig Dispense Refill  . citalopram (CELEXA) 20 MG tablet Take 1 tablet (20 mg total) by mouth daily. 90 tablet 0  . diphenhydramine-acetaminophen (TYLENOL PM) 25-500 MG TABS tablet Take 1 tablet by mouth at bedtime.    . DULoxetine (CYMBALTA) 60 MG capsule Take 1 capsule (60 mg total) by mouth 2 (two) times daily. 180 capsule 1  . ibuprofen (ADVIL,MOTRIN) 800 MG tablet Take 1 tablet (800 mg total) by mouth every 8 (eight) hours as needed. 60 tablet 0  . levETIRAcetam (KEPPRA) 1000 MG tablet Take 1,500 mg by mouth 2 (two) times daily.     Marland Kitchen LORazepam (ATIVAN) 0.5 MG tablet Take 1 tablet (0.5 mg total) by mouth every 8 (eight) hours as  needed for anxiety. 60 tablet 0  . Multiple Vitamin (MULTIVITAMIN) capsule Take 1 capsule by mouth daily.    . ranitidine (ZANTAC) 150 MG capsule Take 1 capsule (150 mg total) by mouth 2 (two) times daily. 60 capsule 1  . ondansetron (ZOFRAN) 8 MG tablet Take 1 tablet (8 mg total) by mouth every 8 (eight) hours as needed for refractory nausea / vomiting. Start on day 3 after chemo. (Patient not taking: Reported on 01/03/2018) 30 tablet 1  . prochlorperazine (COMPAZINE) 10 MG tablet Take 1 tablet (10 mg total) by mouth every 6 (six) hours as needed (Nausea or vomiting). (Patient not taking: Reported on 01/03/2018) 30 tablet 1   No current facility-administered medications for this encounter.     Physical Findings: The patient is in no acute distress. Patient is alert and oriented.  height is 4\' 11"  (1.499 m) and weight is 167 lb (75.8 kg). Her oral temperature is 98.4 F (36.9 C). Her blood pressure is 124/85 and her pulse is 81. Her respiration is 20 and oxygen saturation is 97%.   Lungs are clear to auscultation bilaterally. Heart has regular rate and rhythm. No palpable cervical, supraclavicular, or axillary adenopathy. Abdomen soft, non-tender, normal bowel sounds.  On pelvic examination the external genitalia were unremarkable. A speculum exam was performed. There are no mucosal lesions noted in the vaginal vault. On bimanual and rectovaginal examination there were no pelvic masses appreciated. Exam was very uncomfortable for  her, as noted by Dr. Denman George on her exam 3 months ago.   Lab Findings: Lab Results  Component Value Date   WBC 4.3 10/07/2017   HGB 11.9 (L) 10/07/2017   HCT 35.3 (L) 10/07/2017   MCV 109.3 (H) 10/07/2017   PLT 238 10/07/2017    Radiographic Findings: No results found.  Impression:  Stage IIIC1, grade 1 endometrial cancer. No evidence of recurrence on clinical exam. The patient was encouraged to use her vaginal dilator on a more regular basis to hopefully help  with discomfort during her pelvic exams.  Plan:  Routine follow up in 6 months. Patient will follow up with Dr. Denman George in 3 months.  ____________________________________  Blair Promise, PhD, MD  This document serves as a record of services personally performed by Gery Pray, MD. It was created on his behalf by Rae Lips, a trained medical scribe. The creation of this record is based on the scribe's personal observations and the provider's statements to them. This document has been checked and approved by the attending provider.

## 2018-02-20 NOTE — Telephone Encounter (Signed)
Called patient to inform of fu appt. with Dr. Denman George on 05-21-18 - arrival time- 1:45 pm, spoke with patient and she is aware of this appt.

## 2018-02-21 ENCOUNTER — Ambulatory Visit: Payer: BLUE CROSS/BLUE SHIELD | Admitting: Family Medicine

## 2018-03-05 DIAGNOSIS — M791 Myalgia, unspecified site: Secondary | ICD-10-CM | POA: Diagnosis not present

## 2018-03-05 DIAGNOSIS — M2550 Pain in unspecified joint: Secondary | ICD-10-CM | POA: Diagnosis not present

## 2018-03-05 DIAGNOSIS — M79643 Pain in unspecified hand: Secondary | ICD-10-CM | POA: Diagnosis not present

## 2018-03-05 DIAGNOSIS — M25519 Pain in unspecified shoulder: Secondary | ICD-10-CM | POA: Diagnosis not present

## 2018-03-10 ENCOUNTER — Encounter: Payer: Self-pay | Admitting: Family Medicine

## 2018-03-13 ENCOUNTER — Ambulatory Visit (INDEPENDENT_AMBULATORY_CARE_PROVIDER_SITE_OTHER): Payer: BLUE CROSS/BLUE SHIELD | Admitting: Emergency Medicine

## 2018-03-13 ENCOUNTER — Other Ambulatory Visit: Payer: Self-pay

## 2018-03-13 ENCOUNTER — Other Ambulatory Visit: Payer: Self-pay | Admitting: Family Medicine

## 2018-03-13 ENCOUNTER — Encounter: Payer: Self-pay | Admitting: Emergency Medicine

## 2018-03-13 VITALS — BP 113/79 | HR 91 | Temp 98.8°F | Resp 16 | Ht 59.5 in | Wt 166.4 lb

## 2018-03-13 DIAGNOSIS — F330 Major depressive disorder, recurrent, mild: Secondary | ICD-10-CM | POA: Diagnosis not present

## 2018-03-13 DIAGNOSIS — M797 Fibromyalgia: Secondary | ICD-10-CM

## 2018-03-13 DIAGNOSIS — F411 Generalized anxiety disorder: Secondary | ICD-10-CM

## 2018-03-13 DIAGNOSIS — Z760 Encounter for issue of repeat prescription: Secondary | ICD-10-CM | POA: Diagnosis not present

## 2018-03-13 DIAGNOSIS — M1990 Unspecified osteoarthritis, unspecified site: Secondary | ICD-10-CM | POA: Diagnosis not present

## 2018-03-13 MED ORDER — DULOXETINE HCL 60 MG PO CPEP: 60.0000 mg | ORAL_CAPSULE | Freq: Two times a day (BID) | ORAL | 1 refills | Status: DC

## 2018-03-13 MED ORDER — LORAZEPAM 0.5 MG PO TABS: 0.5000 mg | ORAL_TABLET | Freq: Three times a day (TID) | ORAL | 0 refills | Status: DC | PRN

## 2018-03-13 NOTE — Patient Instructions (Addendum)
   If you have lab work done today you will be contacted with your lab results within the next 2 weeks.  If you have not heard from us then please contact us. The fastest way to get your results is to register for My Chart.   IF you received an x-ray today, you will receive an invoice from [facility]. Please contact [facility] at [phone] with questions or concerns regarding your invoice.   IF you received labwork today, you will receive an invoice from [facility]. Please contact [facility] at [phone] with questions or concerns regarding your invoice.   Our billing staff will not be able to assist you with questions regarding bills from these companies.  You will be contacted with the lab results as soon as they are available. The fastest way to get your results is to activate your My Chart account. Instructions are located on the last page of this paperwork. If you have not heard from us regarding the results in 2 weeks, please contact this office.     Living With Anxiety  After being diagnosed with an anxiety disorder, you may be relieved to know why you have felt or behaved a certain way. It is natural to also feel overwhelmed about the treatment ahead and what it will mean for your life. With care and support, you can manage this condition and recover from it. How to cope with anxiety Dealing with stress Stress is your body's reaction to life changes and events, both good and bad. Stress can last just a few hours or it can be ongoing. Stress can play a major role in anxiety, so it is important to learn both how to cope with stress and how to think about it differently. Talk with your health care provider or a counselor to learn more about stress reduction. He or she may suggest some stress reduction techniques, such as:  Music therapy. This can include creating or listening to music that you enjoy and that inspires you.  Mindfulness-based meditation. This  involves being aware of your normal breaths, rather than trying to control your breathing. It can be done while sitting or walking.  Centering prayer. This is a kind of meditation that involves focusing on a word, phrase, or sacred image that is meaningful to you and that brings you peace.  Deep breathing. To do this, expand your stomach and inhale slowly through your nose. Hold your breath for 3-5 seconds. Then exhale slowly, allowing your stomach muscles to relax.  Self-talk. This is a skill where you identify thought patterns that lead to anxiety reactions and correct those thoughts.  Muscle relaxation. This involves tensing muscles then relaxing them. Choose a stress reduction technique that fits your lifestyle and personality. Stress reduction techniques take time and practice. Set aside 5-15 minutes a day to do them. Therapists can offer training in these techniques. The training may be covered by some insurance plans. Other things you can do to manage stress include:  Keeping a stress diary. This can help you learn what triggers your stress and ways to control your response.  Thinking about how you respond to certain situations. You may not be able to control everything, but you can control your reaction.  Making time for activities that help you relax, and not feeling guilty about spending your time in this way. Therapy combined with coping and stress-reduction skills provides the best chance for successful treatment. Medicines Medicines can help ease symptoms. Medicines for anxiety include:    Anti-anxiety drugs.  Antidepressants.  Beta-blockers. Medicines may be used as the main treatment for anxiety disorder, along with therapy, or if other treatments are not working. Medicines should be prescribed by a health care provider. Relationships Relationships can play a big part in helping you recover. Try to spend more time connecting with trusted friends and family members. Consider  going to couples counseling, taking family education classes, or going to family therapy. Therapy can help you and others better understand the condition. How to recognize changes in your condition Everyone has a different response to treatment for anxiety. Recovery from anxiety happens when symptoms decrease and stop interfering with your daily activities at home or work. This may mean that you will start to:  Have better concentration and focus.  Sleep better.  Be less irritable.  Have more energy.  Have improved memory. It is important to recognize when your condition is getting worse. Contact your health care provider if your symptoms interfere with home or work and you do not feel like your condition is improving. Where to find help and support: You can get help and support from these sources:  Self-help groups.  Online and community organizations.  A trusted spiritual leader.  Couples counseling.  Family education classes.  Family therapy. Follow these instructions at home:  Eat a healthy diet that includes plenty of vegetables, fruits, whole grains, low-fat dairy products, and lean protein. Do not eat a lot of foods that are high in solid fats, added sugars, or salt.  Exercise. Most adults should do the following: ? Exercise for at least 150 minutes each week. The exercise should increase your heart rate and make you sweat (moderate-intensity exercise). ? Strengthening exercises at least twice a week.  Cut down on caffeine, tobacco, alcohol, and other potentially harmful substances.  Get the right amount and quality of sleep. Most adults need 7-9 hours of sleep each night.  Make choices that simplify your life.  Take over-the-counter and prescription medicines only as told by your health care provider.  Avoid caffeine, alcohol, and certain over-the-counter cold medicines. These may make you feel worse. Ask your pharmacist which medicines to avoid.  Keep all  follow-up visits as told by your health care provider. This is important. Questions to ask your health care provider  Would I benefit from therapy?  How often should I follow up with a health care provider?  How long do I need to take medicine?  Are there any long-term side effects of my medicine?  Are there any alternatives to taking medicine? Contact a health care provider if:  You have a hard time staying focused or finishing daily tasks.  You spend many hours a day feeling worried about everyday life.  You become exhausted by worry.  You start to have headaches, feel tense, or have nausea.  You urinate more than normal.  You have diarrhea. Get help right away if:  You have a racing heart and shortness of breath.  You have thoughts of hurting yourself or others. If you ever feel like you may hurt yourself or others, or have thoughts about taking your own life, get help right away. You can go to your nearest emergency department or call:  Your local emergency services (911 in the [place]  A suicide crisis helpline, such as the National Suicide Prevention Lifeline at 1-800-273-8255. This is open 24-hours a day. Summary  Taking steps to deal with stress can help calm you.  Medicines cannot cure anxiety disorders,   anxiety disorders, but they can help ease symptoms.  Family, friends, and partners can play a big part in helping you recover from an anxiety disorder. This information is not intended to replace advice given to you by your health care provider. Make sure you discuss any questions you have with your health care provider. Document Released: 12/27/2015 Document Revised: 12/27/2015 Document Reviewed: 12/27/2015 Elsevier Interactive Patient Education  2019 Elsevier Inc.  
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
## 2018-03-14 ENCOUNTER — Encounter: Payer: Self-pay | Admitting: Emergency Medicine

## 2018-03-14 LAB — CBC WITH DIFFERENTIAL/PLATELET
BASOS ABS: 0 10*3/uL (ref 0.0–0.2)
BASOS: 0 %
EOS (ABSOLUTE): 0 10*3/uL (ref 0.0–0.4)
Eos: 0 %
Hematocrit: 36.8 % (ref 34.0–46.6)
Hemoglobin: 13.1 g/dL (ref 11.1–15.9)
Immature Grans (Abs): 0 10*3/uL (ref 0.0–0.1)
Immature Granulocytes: 0 %
Lymphocytes Absolute: 1.8 10*3/uL (ref 0.7–3.1)
Lymphs: 32 %
MCH: 33.9 pg — ABNORMAL HIGH (ref 26.6–33.0)
MCHC: 35.6 g/dL (ref 31.5–35.7)
MCV: 95 fL (ref 79–97)
MONOS ABS: 0.6 10*3/uL (ref 0.1–0.9)
Monocytes: 10 %
NEUTROS ABS: 3.2 10*3/uL (ref 1.4–7.0)
NEUTROS PCT: 58 %
PLATELETS: 270 10*3/uL (ref 150–450)
RBC: 3.86 x10E6/uL (ref 3.77–5.28)
RDW: 12.5 % (ref 11.7–15.4)
WBC: 5.6 10*3/uL (ref 3.4–10.8)

## 2018-03-14 LAB — COMPREHENSIVE METABOLIC PANEL
A/G RATIO: 2 (ref 1.2–2.2)
ALK PHOS: 80 IU/L (ref 39–117)
ALT: 19 IU/L (ref 0–32)
AST: 21 IU/L (ref 0–40)
Albumin: 4.5 g/dL (ref 3.8–4.9)
BILIRUBIN TOTAL: 0.4 mg/dL (ref 0.0–1.2)
BUN/Creatinine Ratio: 17 (ref 12–28)
BUN: 15 mg/dL (ref 8–27)
CHLORIDE: 103 mmol/L (ref 96–106)
CO2: 24 mmol/L (ref 20–29)
Calcium: 9.4 mg/dL (ref 8.7–10.3)
Creatinine, Ser: 0.9 mg/dL (ref 0.57–1.00)
GFR calc Af Amer: 80 mL/min/{1.73_m2} (ref >59)
GFR calc non Af Amer: 70 mL/min/{1.73_m2} (ref >59)
Globulin, Total: 2.3 g/dL (ref 1.5–4.5)
Glucose: 87 mg/dL (ref 65–99)
POTASSIUM: 4.1 mmol/L (ref 3.5–5.2)
Sodium: 142 mmol/L (ref 134–144)
Total Protein: 6.8 g/dL (ref 6.0–8.5)

## 2018-03-15 ENCOUNTER — Other Ambulatory Visit: Payer: Self-pay | Admitting: Family Medicine

## 2018-03-15 DIAGNOSIS — M797 Fibromyalgia: Secondary | ICD-10-CM

## 2018-03-15 DIAGNOSIS — F330 Major depressive disorder, recurrent, mild: Secondary | ICD-10-CM

## 2018-03-15 MED ORDER — LORAZEPAM 0.5 MG PO TABS: 0.5000 mg | ORAL_TABLET | Freq: Three times a day (TID) | ORAL | 0 refills | Status: DC | PRN

## 2018-03-15 MED ORDER — DULOXETINE HCL 60 MG PO CPEP: 60.0000 mg | ORAL_CAPSULE | Freq: Two times a day (BID) | ORAL | 1 refills | Status: DC

## 2018-03-17 ENCOUNTER — Encounter: Payer: Self-pay | Admitting: Family Medicine

## 2018-03-19 ENCOUNTER — Other Ambulatory Visit: Payer: Self-pay | Admitting: Family Medicine

## 2018-03-19 DIAGNOSIS — Z1231 Encounter for screening mammogram for malignant neoplasm of breast: Secondary | ICD-10-CM

## 2018-03-20 ENCOUNTER — Telehealth: Payer: Self-pay | Admitting: Family Medicine

## 2018-03-20 NOTE — Telephone Encounter (Signed)
Copied from [place] [id number]. Topic: Quick Communication - Rx Refill/Question >> Mar 20, 2018  4:08 PM Gustavus Messing wrote: Medication: citalopram (CELEXA) 20 MG tablet  LORazepam (ATIVAN) 0.5 MG tablet   Patient called in very tearful because insurance will not cover medications until next week. She wanted to see if some could be called in but hung up before could be advised.

## 2018-03-24 NOTE — Telephone Encounter (Signed)
Spoke with pt about medication request and she states she feels better at this time was having a panic attract the time of her call last weeks. Pt states she feels shw can wait until the 3/13 for medication to be refilled.

## 2018-03-29 ENCOUNTER — Encounter: Payer: Self-pay | Admitting: Hematology and Oncology

## 2018-04-04 ENCOUNTER — Ambulatory Visit: Payer: BLUE CROSS/BLUE SHIELD | Admitting: Gynecologic Oncology

## 2018-04-17 ENCOUNTER — Ambulatory Visit: Payer: BLUE CROSS/BLUE SHIELD

## 2018-04-19 ENCOUNTER — Encounter: Payer: Self-pay | Admitting: Family Medicine

## 2018-04-28 DIAGNOSIS — M25519 Pain in unspecified shoulder: Secondary | ICD-10-CM | POA: Diagnosis not present

## 2018-04-28 DIAGNOSIS — Z79899 Other long term (current) drug therapy: Secondary | ICD-10-CM | POA: Diagnosis not present

## 2018-04-28 DIAGNOSIS — M79643 Pain in unspecified hand: Secondary | ICD-10-CM | POA: Diagnosis not present

## 2018-04-28 DIAGNOSIS — M0600 Rheumatoid arthritis without rheumatoid factor, unspecified site: Secondary | ICD-10-CM | POA: Diagnosis not present

## 2018-04-30 ENCOUNTER — Telehealth: Payer: BLUE CROSS/BLUE SHIELD | Admitting: Nurse Practitioner

## 2018-04-30 DIAGNOSIS — M545 Low back pain, unspecified: Secondary | ICD-10-CM

## 2018-04-30 MED ORDER — NAPROXEN 500 MG PO TABS: 500.0000 mg | ORAL_TABLET | Freq: Two times a day (BID) | ORAL | 1 refills | Status: DC

## 2018-04-30 MED ORDER — CYCLOBENZAPRINE HCL 10 MG PO TABS: 10.0000 mg | ORAL_TABLET | Freq: Three times a day (TID) | ORAL | 1 refills | Status: DC | PRN

## 2018-04-30 MED FILL — LORazepam 0.5 MG TABS: 0.5 | 20 days supply | Qty: 60 | Fill #0

## 2018-04-30 NOTE — Progress Notes (Signed)

## 2018-05-02 DIAGNOSIS — F329 Major depressive disorder, single episode, unspecified: Secondary | ICD-10-CM | POA: Diagnosis not present

## 2018-05-02 DIAGNOSIS — G40219 Localization-related (focal) (partial) symptomatic epilepsy and epileptic syndromes with complex partial seizures, intractable, without status epilepticus: Secondary | ICD-10-CM | POA: Diagnosis not present

## 2018-05-07 ENCOUNTER — Encounter: Payer: Self-pay | Admitting: Family Medicine

## 2018-05-14 ENCOUNTER — Telehealth: Payer: Self-pay | Admitting: *Deleted

## 2018-05-14 NOTE — Telephone Encounter (Signed)
Called and spoke with the patient regarding her appt for next week. Patient stated "I have a new insurance, I'm not sure if it will work. I need to call." Explained that she can call the insurance company and call us back.

## 2018-05-19 ENCOUNTER — Ambulatory Visit: Payer: BLUE CROSS/BLUE SHIELD

## 2018-05-21 ENCOUNTER — Encounter: Payer: Self-pay | Admitting: Gynecologic Oncology

## 2018-05-21 ENCOUNTER — Inpatient Hospital Stay: Payer: BLUE CROSS/BLUE SHIELD | Attending: Gynecologic Oncology | Admitting: Gynecologic Oncology

## 2018-05-21 DIAGNOSIS — C530 Malignant neoplasm of endocervix: Secondary | ICD-10-CM

## 2018-05-21 DIAGNOSIS — Z923 Personal history of irradiation: Secondary | ICD-10-CM

## 2018-05-21 DIAGNOSIS — F332 Major depressive disorder, recurrent severe without psychotic features: Secondary | ICD-10-CM | POA: Diagnosis not present

## 2018-05-21 DIAGNOSIS — Z9221 Personal history of antineoplastic chemotherapy: Secondary | ICD-10-CM | POA: Diagnosis not present

## 2018-05-21 DIAGNOSIS — C541 Malignant neoplasm of endometrium: Secondary | ICD-10-CM

## 2018-05-21 DIAGNOSIS — Z90710 Acquired absence of both cervix and uterus: Secondary | ICD-10-CM

## 2018-05-21 DIAGNOSIS — Z90722 Acquired absence of ovaries, bilateral: Secondary | ICD-10-CM

## 2018-05-21 MED ORDER — CITALOPRAM HYDROBROMIDE 20 MG PO TABS: 20.0000 mg | ORAL_TABLET | Freq: Every day | ORAL | 0 refills | Status: DC

## 2018-05-21 NOTE — Progress Notes (Signed)
Gynecologic Oncology Telehealth Follow-up Note: Gyn-Onc  I connected with Rebekah [name] on 05/21/18 at  2:15 PM EDT by telephone/web ex and verified that I am speaking with the correct person using two identifiers.  I discussed the limitations, risks, security and privacy concerns of performing an evaluation and management service by telemedicine and the availability of in-person appointments. I also discussed with the patient that there may be a patient responsible charge related to this service. The patient expressed understanding and agreed to proceed.  Other persons participating in the visit and their role in the encounter: the patient's daughter.  Patient's location: home Provider's location: [place]  Chief Complaint:  Chief Complaint  Patient presents with  . endometrial cancer    follow-up    Assessment/Plan:  [name]  is a 63 y.o.  year old with stage IIIC1 grade 1 endometrial cancer.  S/p 6 cycles carboplatin and paclitaxel and vaginal brachytherapy completed July, 2019.   She has some anxiety and depression and is on celexa which I refilled.  She'll see Dr Sondra Come in 3 months and myself in November, 2020.   HPI: Rebekah [name] is a 63 year old woman who is seen in consultation at the request of Dr Talbert Nan for endometrial cancer (grade 1).  The patient reports a 1 month history of vaginal bleeding (light). She was seen by Dr Talbert Nan on 02/20/17 and endometrial pipelle was performed. It revealed a grade 1 endometrial cancer.  The patient has family history of breast cancer on the maternal side. She also has a family history of colon cancer on the paternal family. She has a seizure disorder for which she takes Keppra. She has seizures "every other month". She has had a tubal ligation but no other surgeries on her abdomen. She has had 4 vaginal deliveries.    On March 26, 2017 she underwent a robotic assisted total hysterectomy  BSO and sentinel lymph node biopsy.  Surgery was uncomplicated.  Final pathology revealed a FIGO grade 1 endometrioid endometrial adenocarcinoma measuring 3.5 cm.  It demonstrated 90% myometrial invasion with 1.4 of 1.5 cm invasion into the myometrial wall.  Lymphovascular space invasion was present.  Cervical stromal involvement was not identified.  There is metastatic endometrial cancer identified extending into the right fallopian tube.  There is also a focus of metastatic carcinoma to one pelvic lymph node (the left external iliac lymph node).  This was a 0.3 mm metastasis.  This represented a stage III C1 endometrial cancer.  And, in accordance with [place] and guidelines, adjuvant systemic chemotherapy with carbo platinum paclitaxel was recommended.  I also recommend vaginal brachii therapy to reduce the risk of vaginal recurrence given the substantial uterine involvement deep myometrial invasion and LV SI.   Interval Hx:  She went on to complete chemotherapy with 6 cycles of carb/taxol between 04/25/17 and 08/12/17 and received vaginal brachytherapy with 30Gy vaginal brachytherapy in 5 fractions (5 x 6Gy) between 05/23/2017-06/20/2017.  She tolerated the therapy very well.  Post treatment CT scan of the abdomen and pelvis on September 11, 2017 revealed no residual metastatic or recurrent disease.  Depression symptoms are worse.  Yellow bowels.  Bruises easily.  Mammogram June 30th scheduled.   Current Meds:  Outpatient Encounter Medications as of 05/21/2018  Medication Sig  . citalopram (CELEXA) 20 MG tablet Take 1 tablet (20 mg total) by mouth daily.  . cyclobenzaprine (FLEXERIL) 10 MG tablet Take 1 tablet (10 mg total) by mouth 3 (three)  times daily as needed for muscle spasms.  . diphenhydramine-acetaminophen (TYLENOL PM) 25-500 MG TABS tablet Take 1 tablet by mouth at bedtime.  . DULoxetine (CYMBALTA) 60 MG capsule Take 1 capsule (60 mg total) by mouth 2 (two) times daily.  Marland Kitchen ibuprofen  (ADVIL,MOTRIN) 800 MG tablet TAKE 1 TABLET BY MOUTH EVERY 6 HOURS  . levETIRAcetam (KEPPRA) 1000 MG tablet Take 1,500 mg by mouth 2 (two) times daily.   Marland Kitchen LORazepam (ATIVAN) 0.5 MG tablet Take 1 tablet (0.5 mg total) by mouth every 8 (eight) hours as needed for anxiety.  . Multiple Vitamin (MULTIVITAMIN) capsule Take 1 capsule by mouth daily.  . naproxen (NAPROSYN) 500 MG tablet Take 1 tablet (500 mg total) by mouth 2 (two) times daily with a meal.  . ondansetron (ZOFRAN) 8 MG tablet Take 1 tablet (8 mg total) by mouth every 8 (eight) hours as needed for refractory nausea / vomiting. Start on day 3 after chemo.  . prochlorperazine (COMPAZINE) 10 MG tablet Take 1 tablet (10 mg total) by mouth every 6 (six) hours as needed (Nausea or vomiting).  . ranitidine (ZANTAC) 150 MG capsule Take 1 capsule (150 mg total) by mouth 2 (two) times daily.  . [DISCONTINUED] citalopram (CELEXA) 20 MG tablet Take 1 tablet (20 mg total) by mouth daily.   No facility-administered encounter medications on file as of 05/21/2018.     Allergy:  Allergies  Allergen Reactions  . Ambien [Zolpidem Tartrate] Other (See Comments)    Other reaction(s): Hallucination  . Soma [Carisoprodol] Other (See Comments)    chills    Social Hx:   Social History   Socioeconomic History  . Marital status: Married    Spouse name: Not on file  . Number of children: 4  . Years of education: 8  . Highest education level: Not on file  Occupational History  . 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 Financial resource strain: Not on file  . Food insecurity:    Worry: Not on file    Inability: Not on file  . Transportation needs:    Medical: Not on file    Non-medical: Not on file  Tobacco Use  . Smoking status: Never Smoker  . Smokeless tobacco: Never Used  Substance and Sexual Activity  . Alcohol use: No    Alcohol/week: 0.0 standard drinks  . Drug use: No  . Sexual activity: Not Currently    Partners: Male    Birth  control/protection: Post-menopausal  Lifestyle  . Physical activity:    Days per week: Not on file    Minutes per session: Not on file  . Stress: Not on file  Relationships  . Social connections:    Talks on phone: Not on file    Gets together: Not on file    Attends religious service: Not on file    Active member of club or organization: Not on file    Attends meetings of clubs or organizations: Not on file    Relationship status: Not on file  . Intimate partner violence:    Fear of current or ex partner: Not on file    Emotionally abused: Not on file    Physically abused: Not on file    Forced sexual activity: Not on file  Other Topics Concern  . Not on file  Social History Narrative   Lives with daughter, [name]   Caffeine use: coffee/tea/soda daily    Past Surgical Hx:  Past Surgical History:  Procedure  Laterality Date  . ABDOMINAL HYSTERECTOMY    . BREAST BIOPSY Right    biopsy in the 1990's  . BUNIONECTOMY Bilateral    2000's - R ONCE and L once  . CARPAL TUNNEL RELEASE Bilateral   . COLONSCOPY    . ENDOMETRIAL BIOPSY  02/2017  . IR FLUORO GUIDE PORT INSERTION RIGHT  04/24/2017  . IR REMOVAL TUN ACCESS W/ PORT W/O FL MOD SED  10/07/2017  . IR US GUIDE VASC ACCESS RIGHT  04/24/2017  . ROBOTIC ASSISTED TOTAL HYSTERECTOMY WITH BILATERAL SALPINGO OOPHERECTOMY Bilateral 03/26/2017   Procedure: XI ROBOTIC ASSISTED TOTAL HYSTERECTOMY WITH BILATERAL SALPINGO OOPHORECTOMY AND SENTINAL LYMPH NODES;  Surgeon: Everitt Amber, MD;  Location: [facility];  Service: Gynecology;  Laterality: Bilateral;  . TUBAL LIGATION  1984  . TUBAL LIGATION      Past Medical Hx:  Past Medical History:  Diagnosis Date  . Anxiety   . Arthritis   . Asthma    NO INHALERS USED  . Cancer ([place])    ENDOMETRIAL  . Depression   . Family history of breast cancer   . Family history of colon cancer   . Fibromyalgia   . GERD (gastroesophageal reflux disease)   . Insomnia   . Seizure disorder  ([place])    LAST SEIZURE FEBRUARY 2019 SEES DR [facility]   . Seizures ([place])     Past Gynecological History:  SVD x 4 No LMP recorded. Patient has had a hysterectomy.  Family Hx:  Family History  Problem Relation Age of Onset  . Breast cancer Mother 68  . Diabetes Brother   . Hypertension Brother   . Stroke Brother   . Breast cancer Maternal Grandmother        dx >50  . Breast cancer Maternal Aunt        dx <50  . Colon cancer Paternal Aunt   . Cancer Paternal Uncle        type unk  . Breast cancer Maternal Aunt        dx >50  . Cancer Paternal Aunt   . Cancer Paternal Uncle     Review of Systems:  Constitutional  Feels well,    ENT Normal appearing ears and nares bilaterally Skin/Breast  No rash, sores, jaundice, itching, dryness Cardiovascular  No chest pain, shortness of breath, or edema  Pulmonary  No cough or wheeze.  Gastro Intestinal  No nausea, vomitting, or diarrhoea. No bright red blood per rectum, no abdominal pain, change in bowel movement, or constipation.  Genito Urinary  No frequency, urgency, dysuria, no bleeding, no discharge Musculo Skeletal  No myalgia, arthralgia, joint swelling or pain  Neurologic  No weakness, numbness, change in gait,  Psychology  + depression  Vitals:  There were no vitals taken for this visit.  Physical Exam:  Deferred due to webex visit. No scleral icteris.  Tearful   I discussed the assessment and treatment plan with the patient. The patient was provided with an opportunity to ask questions and all were answered. The patient agreed with the plan and demonstrated an understanding of the instructions.   The patient was advised to call back or see an in-person evaluation if the symptoms worsen or if the condition fails to improve as anticipated.   I provided 12 minutes of face-to-face video visit time during this encounter, and > 50% was spent counseling as documented under my assessment &  plan.   Thereasa Solo, MD  05/21/2018, 2:31 PM

## 2018-05-23 ENCOUNTER — Encounter: Payer: Self-pay | Admitting: Gynecologic Oncology

## 2018-05-27 ENCOUNTER — Telehealth: Payer: Self-pay | Admitting: Family Medicine

## 2018-05-27 DIAGNOSIS — M25519 Pain in unspecified shoulder: Secondary | ICD-10-CM | POA: Diagnosis not present

## 2018-05-27 DIAGNOSIS — Z79899 Other long term (current) drug therapy: Secondary | ICD-10-CM | POA: Diagnosis not present

## 2018-05-27 DIAGNOSIS — M0600 Rheumatoid arthritis without rheumatoid factor, unspecified site: Secondary | ICD-10-CM | POA: Diagnosis not present

## 2018-05-27 DIAGNOSIS — M79643 Pain in unspecified hand: Secondary | ICD-10-CM | POA: Diagnosis not present

## 2018-05-27 NOTE — Telephone Encounter (Signed)
Copied from [place] [id number]. Topic: General - Call Back - No Documentation >> May 27, 2018  3:48 PM Erick Blinks wrote: Reason for CRM: Pt called to request a call back from Nurse. She is experiencing emotional challenges, anxiety, and paranoia and wants to speak with providers. Best contact: [phone]

## 2018-05-28 ENCOUNTER — Telehealth: Payer: Self-pay | Admitting: Family Medicine

## 2018-05-28 NOTE — Telephone Encounter (Signed)
Spoke with pt and scheduled appointment with Cr. Corum tomorrow, she verbalized understanding.

## 2018-05-29 ENCOUNTER — Other Ambulatory Visit: Payer: Self-pay

## 2018-05-29 ENCOUNTER — Telehealth (INDEPENDENT_AMBULATORY_CARE_PROVIDER_SITE_OTHER): Payer: BLUE CROSS/BLUE SHIELD | Admitting: Family Medicine

## 2018-05-29 DIAGNOSIS — F411 Generalized anxiety disorder: Secondary | ICD-10-CM | POA: Insufficient documentation

## 2018-05-29 DIAGNOSIS — R569 Unspecified convulsions: Secondary | ICD-10-CM

## 2018-05-29 MED ORDER — CITALOPRAM HYDROBROMIDE 20 MG PO TABS: 20.0000 mg | ORAL_TABLET | Freq: Every day | ORAL | 0 refills | Status: DC

## 2018-05-29 NOTE — Progress Notes (Signed)
PHQ9=13 has been feeling down lately.
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
## 2018-05-29 NOTE — Patient Instructions (Signed)
° ° ° °  If you have lab work done today you will be contacted with your lab results within the next 2 weeks.  If you have not heard from us then please contact us. The fastest way to get your results is to register for My Chart. ° ° °IF you received an x-ray today, you will receive an invoice from [facility]. Please contact [facility] at [phone] with questions or concerns regarding your invoice.  ° °IF you received labwork today, you will receive an invoice from [facility]. Please contact [facility] at [phone] with questions or concerns regarding your invoice.  ° °Our billing staff will not be able to assist you with questions regarding bills from these companies. ° °You will be contacted with the lab results as soon as they are available. The fastest way to get your results is to activate your My Chart account. Instructions are located on the last page of this paperwork. If you have not heard from us regarding the results in 2 weeks, please contact this office. °  ° ° ° °

## 2018-06-03 ENCOUNTER — Ambulatory Visit: Payer: Self-pay

## 2018-06-03 NOTE — Telephone Encounter (Signed)
Patient called and says this morning at 0830 she started having chest pain from her neck down her left side to her belly button. She says the pain was a 10, so she went to lay down and after 30 minutes, the pain went away. She says she then got up, made breakfast, ate and has been fine the rest of the day. She says she had another episode, but it lasted a minute or two. She denies pain now. I called the office and spoke to [place] and while talking to [facility], the patient hung up the phone. I advised I will call her back and Stanton Kidney says she will find a nurse to take the call while I try the patient, I called the patient and the answering machine came on, left VM to call the office back. Alinda Dooms, RN that the message was left, she verbalized understanding. I attempted to call the patient again and she answered, says the office called her back and says they don't take her insurance.   Answer Assessment - Initial Assessment Questions 1. LOCATION: "Where does it hurt?"       Neck down to middle of chest right over belly button on the left side only, no symptoms now 2. RADIATION: "Does the pain go anywhere else?" (e.g., into neck, jaw, arms, back)     No 3. ONSET: "When did the chest pain begin?" (Minutes, hours or days)      Happened this morning around 0830 4. PATTERN "Does the pain come and go, or has it been constant since it started?"  "Does it get worse with exertion?"      Comes and goes 5. DURATION: "How long does it last" (e.g., seconds, minutes, hours)     30 minutes 6. SEVERITY: "How bad is the pain?"  (e.g., Scale 1-10; mild, moderate, or severe)    - MILD (1-3): doesn't interfere with normal activities     - MODERATE (4-7): interferes with normal activities or awakens from sleep    - SEVERE (8-10): excruciating pain, unable to do any normal activities       10 7. CARDIAC RISK FACTORS: "Do you have any history of heart problems or risk factors for heart disease?" (e.g., prior heart  attack, angina; high blood pressure, diabetes, being overweight, high cholesterol, smoking, or strong family history of heart disease)     Overweight  8. PULMONARY RISK FACTORS: "Do you have any history of lung disease?"  (e.g., blood clots in lung, asthma, emphysema, birth control pills)     No 9. CAUSE: "What do you think is causing the chest pain?"     I have no idea 10. OTHER SYMPTOMS: "Do you have any other symptoms?" (e.g., dizziness, nausea, vomiting, sweating, fever, difficulty breathing, cough)     No, I really don't remember what happened during the pain 11. PREGNANCY: "Is there any chance you are pregnant?" "When was your last menstrual period?"       No  Protocols used: CHEST PAIN-A-AH

## 2018-06-03 NOTE — Telephone Encounter (Signed)
Patient called, left VM to return call to the office to speak to a TN about her symptoms.    Summary: advice   Pt states this morning she had a pain from her neck to stomach, the width of her hand. Tip of her chin to her belly button. This happened at 8:30 am. Then she had another small one. The first one lastest 30 min. Next one 1 min. Pt wants to know if this is something to be concerned about. No arm pain,no fever. pts states she had been feeling good. And is feeling good now. Please advise

## 2018-06-10 ENCOUNTER — Ambulatory Visit: Payer: BLUE CROSS/BLUE SHIELD | Admitting: Family Medicine

## 2018-06-13 DIAGNOSIS — G4700 Insomnia, unspecified: Secondary | ICD-10-CM | POA: Diagnosis not present

## 2018-06-13 DIAGNOSIS — K219 Gastro-esophageal reflux disease without esophagitis: Secondary | ICD-10-CM | POA: Diagnosis not present

## 2018-06-13 DIAGNOSIS — F3341 Major depressive disorder, recurrent, in partial remission: Secondary | ICD-10-CM | POA: Diagnosis not present

## 2018-06-13 DIAGNOSIS — Z7689 Persons encountering health services in other specified circumstances: Secondary | ICD-10-CM | POA: Diagnosis not present

## 2018-07-15 ENCOUNTER — Ambulatory Visit: Payer: BLUE CROSS/BLUE SHIELD

## 2018-07-15 ENCOUNTER — Other Ambulatory Visit: Payer: Self-pay

## 2018-08-08 DIAGNOSIS — Z1231 Encounter for screening mammogram for malignant neoplasm of breast: Secondary | ICD-10-CM | POA: Diagnosis not present

## 2018-08-08 DIAGNOSIS — Z1239 Encounter for other screening for malignant neoplasm of breast: Secondary | ICD-10-CM | POA: Diagnosis not present

## 2018-08-15 DIAGNOSIS — F22 Delusional disorders: Secondary | ICD-10-CM | POA: Diagnosis not present

## 2018-08-15 DIAGNOSIS — H2513 Age-related nuclear cataract, bilateral: Secondary | ICD-10-CM | POA: Diagnosis not present

## 2018-08-15 DIAGNOSIS — F411 Generalized anxiety disorder: Secondary | ICD-10-CM | POA: Diagnosis not present

## 2018-08-15 DIAGNOSIS — F321 Major depressive disorder, single episode, moderate: Secondary | ICD-10-CM | POA: Diagnosis not present

## 2018-08-15 DIAGNOSIS — H40033 Anatomical narrow angle, bilateral: Secondary | ICD-10-CM | POA: Diagnosis not present

## 2018-08-21 ENCOUNTER — Ambulatory Visit
Admission: RE | Admit: 2018-08-21 | Discharge: 2018-08-21 | Disposition: A | Discharge status: Home / Self Care | Payer: BLUE CROSS/BLUE SHIELD | Source: Ambulatory Visit | Attending: Radiation Oncology | Admitting: Radiation Oncology

## 2018-09-06 DIAGNOSIS — Z23 Encounter for immunization: Secondary | ICD-10-CM | POA: Diagnosis not present

## 2018-09-08 DIAGNOSIS — Z823 Family history of stroke: Secondary | ICD-10-CM | POA: Diagnosis not present

## 2018-09-08 DIAGNOSIS — Z90710 Acquired absence of both cervix and uterus: Secondary | ICD-10-CM | POA: Diagnosis not present

## 2018-09-08 DIAGNOSIS — Z8249 Family history of ischemic heart disease and other diseases of the circulatory system: Secondary | ICD-10-CM | POA: Diagnosis not present

## 2018-09-08 DIAGNOSIS — M797 Fibromyalgia: Secondary | ICD-10-CM | POA: Diagnosis not present

## 2018-09-08 DIAGNOSIS — Z90722 Acquired absence of ovaries, bilateral: Secondary | ICD-10-CM | POA: Diagnosis not present

## 2018-09-08 DIAGNOSIS — Z833 Family history of diabetes mellitus: Secondary | ICD-10-CM | POA: Diagnosis not present

## 2018-09-08 DIAGNOSIS — M1991 Primary osteoarthritis, unspecified site: Secondary | ICD-10-CM | POA: Diagnosis not present

## 2018-09-08 DIAGNOSIS — G40909 Epilepsy, unspecified, not intractable, without status epilepticus: Secondary | ICD-10-CM | POA: Diagnosis not present

## 2018-09-08 DIAGNOSIS — M1990 Unspecified osteoarthritis, unspecified site: Secondary | ICD-10-CM | POA: Diagnosis not present

## 2018-09-09 DIAGNOSIS — F331 Major depressive disorder, recurrent, moderate: Secondary | ICD-10-CM | POA: Diagnosis not present

## 2018-09-09 DIAGNOSIS — F411 Generalized anxiety disorder: Secondary | ICD-10-CM | POA: Diagnosis not present

## 2018-09-12 DIAGNOSIS — K219 Gastro-esophageal reflux disease without esophagitis: Secondary | ICD-10-CM | POA: Diagnosis not present

## 2018-09-12 DIAGNOSIS — M797 Fibromyalgia: Secondary | ICD-10-CM | POA: Diagnosis not present

## 2018-09-12 DIAGNOSIS — F411 Generalized anxiety disorder: Secondary | ICD-10-CM | POA: Diagnosis not present

## 2018-09-12 DIAGNOSIS — F3341 Major depressive disorder, recurrent, in partial remission: Secondary | ICD-10-CM | POA: Diagnosis not present

## 2018-09-19 DIAGNOSIS — Z7689 Persons encountering health services in other specified circumstances: Secondary | ICD-10-CM | POA: Diagnosis not present

## 2018-09-19 DIAGNOSIS — Z136 Encounter for screening for cardiovascular disorders: Secondary | ICD-10-CM | POA: Diagnosis not present

## 2018-09-19 DIAGNOSIS — F3341 Major depressive disorder, recurrent, in partial remission: Secondary | ICD-10-CM | POA: Diagnosis not present

## 2018-09-19 DIAGNOSIS — F411 Generalized anxiety disorder: Secondary | ICD-10-CM | POA: Diagnosis not present

## 2018-09-19 DIAGNOSIS — Z79899 Other long term (current) drug therapy: Secondary | ICD-10-CM | POA: Diagnosis not present

## 2018-09-29 DIAGNOSIS — C541 Malignant neoplasm of endometrium: Secondary | ICD-10-CM | POA: Diagnosis not present

## 2018-10-03 DIAGNOSIS — D7589 Other specified diseases of blood and blood-forming organs: Secondary | ICD-10-CM | POA: Diagnosis not present

## 2018-10-03 DIAGNOSIS — M0600 Rheumatoid arthritis without rheumatoid factor, unspecified site: Secondary | ICD-10-CM | POA: Diagnosis not present

## 2018-10-03 DIAGNOSIS — C541 Malignant neoplasm of endometrium: Secondary | ICD-10-CM | POA: Diagnosis not present

## 2018-10-03 DIAGNOSIS — R918 Other nonspecific abnormal finding of lung field: Secondary | ICD-10-CM | POA: Diagnosis not present

## 2018-10-03 DIAGNOSIS — M542 Cervicalgia: Secondary | ICD-10-CM | POA: Diagnosis not present

## 2018-10-03 DIAGNOSIS — M50322 Other cervical disc degeneration at C5-C6 level: Secondary | ICD-10-CM | POA: Diagnosis not present

## 2018-10-13 ENCOUNTER — Other Ambulatory Visit: Payer: Self-pay | Admitting: Family Medicine

## 2018-10-13 DIAGNOSIS — F330 Major depressive disorder, recurrent, mild: Secondary | ICD-10-CM

## 2018-10-13 DIAGNOSIS — M797 Fibromyalgia: Secondary | ICD-10-CM

## 2018-11-11 ENCOUNTER — Other Ambulatory Visit: Payer: Self-pay | Admitting: Family Medicine

## 2018-11-11 DIAGNOSIS — F330 Major depressive disorder, recurrent, mild: Secondary | ICD-10-CM

## 2018-11-11 DIAGNOSIS — M797 Fibromyalgia: Secondary | ICD-10-CM

## 2019-01-06 ENCOUNTER — Other Ambulatory Visit: Payer: Self-pay | Admitting: Family Medicine

## 2019-02-14 ENCOUNTER — Ambulatory Visit (INDEPENDENT_AMBULATORY_CARE_PROVIDER_SITE_OTHER): Payer: Medicare Other | Admitting: Psychiatry

## 2019-02-14 ENCOUNTER — Other Ambulatory Visit: Payer: Self-pay

## 2019-02-14 ENCOUNTER — Encounter (HOSPITAL_COMMUNITY): Payer: Self-pay | Admitting: Psychiatry

## 2019-02-14 DIAGNOSIS — F331 Major depressive disorder, recurrent, moderate: Secondary | ICD-10-CM | POA: Diagnosis not present

## 2019-02-14 DIAGNOSIS — M797 Fibromyalgia: Secondary | ICD-10-CM

## 2019-02-14 MED ORDER — LORAZEPAM 0.5 MG PO TABS: 0.5000 mg | ORAL_TABLET | Freq: Every day | ORAL | 2 refills | Status: DC

## 2019-02-14 MED ORDER — ARIPIPRAZOLE 2 MG PO TABS: 2.0000 mg | ORAL_TABLET | Freq: Every day | ORAL | 2 refills | Status: DC

## 2019-02-14 MED ORDER — DULOXETINE HCL 60 MG PO CPEP: 60.0000 mg | ORAL_CAPSULE | Freq: Two times a day (BID) | ORAL | 0 refills | Status: DC

## 2019-02-14 NOTE — Progress Notes (Signed)
Virtual Visit via Telephone Note  I connected with Rebekah [name] on 02/14/19 at  8:00 AM EST by telephone and verified that I am speaking with the correct person using two identifiers.   I discussed the limitations, risks, security and privacy concerns of performing an evaluation and management service by telephone and the availability of in person appointments. I also discussed with the patient that there may be a patient responsible charge related to this service. The patient expressed understanding and agreed to proceed.    I discussed the assessment and treatment plan with the patient. The patient was provided an opportunity to ask questions and all were answered. The patient agreed with the plan and demonstrated an understanding of the instructions.   The patient was advised to call back or seek an in-person evaluation if the symptoms worsen or if the condition fails to improve as anticipated.  I provided 60 minutes of non-face-to-face time during this encounter.   Levonne Spiller, MD   Psychiatric Initial Adult Assessment   Patient Identification: Rebekah [name] MRN:  [id number] Date of Evaluation:  02/14/2019 Referral Source: [facility] oncology Chief Complaint:   Chief Complaint    Depression; Anxiety     Visit Diagnosis:    ICD-10-CM   1. Fibromyalgia  M79.7 DULoxetine (CYMBALTA) 60 MG capsule  2. Moderate episode of recurrent major depressive disorder (HCC)  F33.1 DULoxetine (CYMBALTA) 60 MG capsule    History of Present Illness:   This patient is a 64 year old separated female who lives with her daughter the daughter's husband and her 3 grandchildren in [place].  She is on disability.  The patient was referred by her oncologist for further treatment of depression and anxiety.  The patient is seen via telemedicine with her daughter today for her first psychiatric assessment.  She states that she has had depression more than 30 years.  She states  that this started in 04/10/1976 after her mother died.  Also in the 80s she lost her first husband because he drank a lot.  She is never really had any psychiatric treatment.  The patient has been dealing with a lot of medical problems including rheumatoid arthritis fibromyalgia active treatment for uterine cancer which has recently metastasized to the lung.  She is receiving treatment from [facility].  In 2017-04-10 she had chemotherapy and recently underwent 5 immunotherapy treatments which have caused a lot of diarrhea.  Furthermore the patient states that her husband left abruptly in 04-11-2015 after a 4-year marriage.  She states that he abruptly went to [place] without telling her.  They are still in touch but she does not want him to come back.  She has moved in with her daughter's family which keeps her from being lonely but is often overwhelming.  She states that the children are ages 34 3 and 7 months and often make a lot of noise which causes her to become extremely anxious.  Her current symptoms include depressed mood anhedonia feeling hopeless about her marriage easily tired anxious and feeling "like there is a knot in my chest all the time".  She denies any thoughts of self-harm or suicidal ideation.  At times she feels paranoid like someone might break in the house but she denies any auditory visual hallucinations delusions or other psychotic symptoms.  Her psychiatric medications have mostly been prescribed by oncology and rheumatology.  She is on Cymbalta 60 mg twice daily which has helped to some degree.  Celexa 20 mg has been added  but she only takes it intermittently.  She was also on Abilify for short time but was not taking this consistently either.  She is prescribed Ativan 0.5 mg to take as needed which she takes sporadically.  Her daughter is concerned because there is confusion about the medicines and they want someone to manage this.  I explained to the patient and daughter that these medications  do not work unless they take them consistently on a schedule and they voiced understanding. Associated Signs/Symptoms: Depression Symptoms:  depressed mood, anhedonia, psychomotor retardation, fatigue, feelings of worthlessness/guilt, anxiety, loss of energy/fatigue, (Hypo) Manic Symptoms:  Irritable Mood, Anxiety Symptoms:  Excessive Worry, Psychotic Symptoms:  Paranoia, PTSD Symptoms:   Past Psychiatric History: none  Previous Psychotropic Medications: Yes   Substance Abuse History in the last 12 months:  No.  Consequences of Substance Abuse: Negative  Past Medical History:  Past Medical History:  Diagnosis Date  . Anxiety   . Arthritis   . Asthma    NO INHALERS USED  . Cancer ([place])    ENDOMETRIAL  . Depression   . Family history of breast cancer   . Family history of colon cancer   . Fibromyalgia   . GERD (gastroesophageal reflux disease)   . Insomnia   . Seizure disorder ([place])    LAST SEIZURE FEBRUARY 2019 SEES DR [facility]   . Seizures ([place])     Past Surgical History:  Procedure Laterality Date  . ABDOMINAL HYSTERECTOMY    . BREAST BIOPSY Right    biopsy in the 1990's  . BUNIONECTOMY Bilateral    2000's - R ONCE and L once  . CARPAL TUNNEL RELEASE Bilateral   . COLONSCOPY    . ENDOMETRIAL BIOPSY  02/2017  . IR FLUORO GUIDE PORT INSERTION RIGHT  04/24/2017  . IR REMOVAL TUN ACCESS W/ PORT W/O FL MOD SED  10/07/2017  . IR US GUIDE VASC ACCESS RIGHT  04/24/2017  . ROBOTIC ASSISTED TOTAL HYSTERECTOMY WITH BILATERAL SALPINGO OOPHERECTOMY Bilateral 03/26/2017   Procedure: XI ROBOTIC ASSISTED TOTAL HYSTERECTOMY WITH BILATERAL SALPINGO OOPHORECTOMY AND SENTINAL LYMPH NODES;  Surgeon: Everitt Amber, MD;  Location: [facility];  Service: Gynecology;  Laterality: Bilateral;  . TUBAL LIGATION  1984  . TUBAL LIGATION      Family Psychiatric History: none  Family History:  Family History  Problem Relation Age of Onset  . Breast cancer Mother 51  .  Diabetes Brother   . Hypertension Brother   . Stroke Brother   . Breast cancer Maternal Grandmother        dx >50  . Breast cancer Maternal Aunt        dx <50  . Colon cancer Paternal Aunt   . Cancer Paternal Uncle        type unk  . Breast cancer Maternal Aunt        dx >50  . Cancer Paternal Aunt   . Cancer Paternal Uncle     Social History:   Social History   Socioeconomic History  . Marital status: Married    Spouse name: Not on file  . Number of children: 4  . Years of education: 8  . Highest education level: Not on file  Occupational History  . 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 Smoking status: Never Smoker  . Smokeless tobacco: Never Used  Substance and Sexual Activity  . Alcohol use: No    Alcohol/week: 0.0 standard drinks  . Drug use:  No  . Sexual activity: Not Currently    Partners: Male    Birth control/protection: Post-menopausal  Other Topics Concern  . Not on file  Social History Narrative   Lives with daughter, Rebekah "Nicole Kindred"   Caffeine use: coffee/tea/soda daily   Social Determinants of Health   Financial Resource Strain:   . Difficulty of Paying Living Expenses: Not on file  Food Insecurity:   . Worried About Charity fundraiser in the Last Year: Not on file  . [name] Out of Food in the Last Year: Not on file  Transportation Needs:   . Lack of Transportation (Medical): Not on file  . Lack of Transportation (Non-Medical): Not on file  Physical Activity:   . Days of Exercise per Week: Not on file  . Minutes of Exercise per Session: Not on file  Stress:   . Feeling of Stress : Not on file  Social Connections:   . Frequency of Communication with Friends and Family: Not on file  . Frequency of Social Gatherings with Friends and Family: Not on file  . Attends Religious Services: Not on file  . Active Member of Clubs or Organizations: Not on file  . Attends Archivist Meetings: Not on file  . Marital Status: Not on file     Additional Social History:   Allergies:   Allergies  Allergen Reactions  . Ambien [Zolpidem Tartrate] Other (See Comments)    Other reaction(s): Hallucination  . Soma [Carisoprodol] Other (See Comments)    chills    Metabolic Disorder Labs: Lab Results  Component Value Date   HGBA1C 5.9 (H) 08/24/2014   MPG 123 (H) 08/24/2014   No results found for: PROLACTIN Lab Results  Component Value Date   CHOL 193 08/24/2014   TRIG 110 08/24/2014   HDL 58 08/24/2014   CHOLHDL 3.3 08/24/2014   VLDL 22 08/24/2014   LDLCALC 113 08/24/2014   Lab Results  Component Value Date   TSH 1.030 11/07/2016    Therapeutic Level Labs: No results found for: LITHIUM No results found for: CBMZ No results found for: VALPROATE  Current Medications: Current Outpatient Medications  Medication Sig Dispense Refill  . ARIPiprazole (ABILIFY) 2 MG tablet Take 1 tablet (2 mg total) by mouth at bedtime. 30 tablet 2  . diphenhydrAMINE HCl, Sleep, (SLEEP AID) 25 MG CAPS Take by mouth.    . DULoxetine (CYMBALTA) 60 MG capsule Take 1 capsule (60 mg total) by mouth 2 (two) times daily. [id number] capsule 0  . folic acid (FOLVITE) 1 MG tablet     . lamoTRIgine (LAMICTAL) 100 MG tablet 150 mg 2 (two) times daily.     Marland Kitchen levETIRAcetam (KEPPRA) 1000 MG tablet Take 1,500 mg by mouth 2 (two) times daily.     Marland Kitchen LORazepam (ATIVAN) 0.5 MG tablet Take 1 tablet (0.5 mg total) by mouth at bedtime. 30 tablet 2  . methotrexate 2.5 MG tablet Take 15 mg by mouth once a week.    . Multiple Vitamin (MULTIVITAMIN) capsule Take 1 capsule by mouth daily.    . naproxen (NAPROSYN) 500 MG tablet Take 1 tablet (500 mg total) by mouth 2 (two) times daily with a meal. (Patient not taking: Reported on 05/29/2018) 60 tablet 1  . predniSONE (DELTASONE) 5 MG tablet     . ranitidine (ZANTAC) 150 MG capsule Take 1 capsule (150 mg total) by mouth 2 (two) times daily. 60 capsule 1   No current facility-administered medications for this visit.  Musculoskeletal: Strength & Muscle Tone: within normal limits Gait & Station: normal Patient leans: N/A  Psychiatric Specialty Exam: Review of Systems  Constitutional: Positive for fatigue.  Gastrointestinal: Positive for diarrhea.  Musculoskeletal: Positive for arthralgias and myalgias.  Psychiatric/Behavioral: Positive for dysphoric mood. The patient is nervous/anxious.   All other systems reviewed and are negative.   There were no vitals taken for this visit.There is no height or weight on file to calculate BMI.  General Appearance: Casual, Neat and Well Groomed  Eye Contact:  Good  Speech:  Clear and Coherent  Volume:  Normal  Mood:  Anxious and Dysphoric  Affect:  Appropriate and Congruent  Thought Process:  Goal Directed  Orientation:  Full (Time, Place, and Person)  Thought Content:  Paranoid Ideation  Suicidal Thoughts:  No  Homicidal Thoughts:  No  Memory:  Immediate;   Good Recent;   Good Remote;   Fair  Judgement:  Good  Insight:  Fair  Psychomotor Activity:  Decreased  Concentration:  Concentration: Fair and Attention Span: Fair  Recall:  Good  Fund of Knowledge:Good  Language: Good  Akathisia:  No  Handed:  Right  AIMS (if indicated):  not done  Assets:  Communication Skills Desire for Improvement Resilience Social Support Talents/Skills  ADL's:  Intact  Cognition: WNL  Sleep:  Fair   Screenings: PHQ2-9     Telemedicine from 05/29/2018 in Primary Care at [place] from 03/13/2018 in [place] at [place] from 01/03/2018 in Primary Care at [place] from 11/29/2017 in Primary Care at [place] from 10/03/2017 in Primary Care at [facility] Total Score  5  4  0  0  4  PHQ-9 Total Score  13  15  --  --  13      Assessment and Plan: This patient is a 64 year old female with a long term history of depression.  She has recently undergone numerous stressors including the dissolution of her marriage feeling  abandoned by her husband, chronic fatigue fibromyalgia chronic pain as well as recent treatment for uterine cancer which has metastasized.  Obviously she is overwhelmed with stress.  She does have good social support from her family.  She is not taking medications consistently and I think we have to start from the beginning and make sure she is taking what is prescribed.  She will continue Cymbalta 60 mg twice daily for depression and chronic pain.  We will stop Celexa since she is not really taking it consistently anyway.  We will add Abilify 2 mg at bedtime for the paranoid thinking and agitation as well as Ativan 0.5 mg every bedtime to help with anxiety.  She will return in 4 weeks to see a psychiatrist in our [place] office as well as start therapy.   Levonne Spiller, MD 1/30/20218:52 AM

## 2019-03-10 ENCOUNTER — Other Ambulatory Visit: Payer: Self-pay

## 2019-03-10 ENCOUNTER — Ambulatory Visit (INDEPENDENT_AMBULATORY_CARE_PROVIDER_SITE_OTHER): Payer: Medicare Other | Admitting: Psychiatry

## 2019-03-10 DIAGNOSIS — F331 Major depressive disorder, recurrent, moderate: Secondary | ICD-10-CM | POA: Diagnosis not present

## 2019-03-10 DIAGNOSIS — F411 Generalized anxiety disorder: Secondary | ICD-10-CM

## 2019-03-10 MED ORDER — LORAZEPAM 0.5 MG PO TABS: 0.5000 mg | ORAL_TABLET | Freq: Three times a day (TID) | ORAL | 2 refills | Status: DC | PRN

## 2019-03-10 NOTE — Progress Notes (Addendum)
[facility] MD/PA/NP OP Progress Note  03/10/2019 11:25 AM Rebekah [name]  MRN:  [id number] Interview was conducted using WebEx teleconferencing application and I verified that I was speaking with the correct person using two identifiers. I discussed the limitations of evaluation and management by telemedicine and  the availability of in person appointments. Patient expressed understanding and agreed to proceed.  Chief Complaint: Anxiety, depression.  HPI: This is the first follow up visit after initial psychiatric assessment by Dr. Harrington Challenger done on 02/14/19. [name] is a 64 yo separated Latina with a long history of depression and anxiety. She states that first time she experienced depression was in 05/02/1976 after her mother died.  Then in the 80s she lost her first husband because he drank a lot.  She is never really had any psychiatric treatment though.  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  She is receiving treatment from [facility]: completed 5 immunotherapy treatments which had to be stopped due to colitis. She just restarted chemotherapy (had it also in 05/02/2017). Medications for depression/anxiety have been prescribed by her oncologist but she has not been consistent with taking some of them. Dr. Harrington Challenger continued duloxetine 60 mg bid and added aripiprazole 2 mg at HS and lorazepam 0.5 mg at HS for sleep/anxiety. Rebekah [name] reports that she has on and off problems with intial insomnia and feels anxious most of the time. She does not feel suicidal but rather hopeless about her marriage. Her husband of 4 years abruptly left her in 05/03/15 and went to [place]. They are still in touch with each other. She feels tired most of the time and felt nauseated for 4-5 days after first course of chemo (takes Zofran prn nausea).  Visit Diagnosis:    ICD-10-CM   1. Generalized anxiety  disorder  F41.1   2. Major depressive disorder, recurrent episode, moderate (HCC)  F33.1     Past Psychiatric History: Please see intake H&P.  Past Medical History:  Past Medical History:  Diagnosis Date  . Anxiety   . Arthritis   . Asthma    NO INHALERS USED  . Cancer ([place])    ENDOMETRIAL  . Depression   . Family history of breast cancer   . Family history of colon cancer   . Fibromyalgia   . GERD (gastroesophageal reflux disease)   . Insomnia   . Seizure disorder ([place])    LAST SEIZURE FEBRUARY 02-May-2017 SEES DR [facility]   . Seizures ([place])     Past Surgical History:  Procedure Laterality Date  . ABDOMINAL HYSTERECTOMY    . BREAST BIOPSY Right    biopsy in the 1990's  . BUNIONECTOMY Bilateral    2000's - R ONCE and L once  . CARPAL TUNNEL RELEASE Bilateral   . COLONSCOPY    . ENDOMETRIAL BIOPSY  02/2017  . IR FLUORO GUIDE PORT INSERTION RIGHT  04/24/2017  . IR REMOVAL TUN ACCESS W/ PORT W/O FL MOD SED  10/07/2017  . IR US GUIDE VASC ACCESS RIGHT  04/24/2017  . ROBOTIC ASSISTED TOTAL HYSTERECTOMY WITH BILATERAL SALPINGO OOPHERECTOMY Bilateral 03/26/2017   Procedure: XI ROBOTIC ASSISTED TOTAL HYSTERECTOMY WITH BILATERAL SALPINGO OOPHORECTOMY AND SENTINAL LYMPH NODES;  Surgeon: Everitt Amber, MD;  Location: [facility];  Service: Gynecology;  Laterality: Bilateral;  . TUBAL LIGATION  03-May-1982  . TUBAL LIGATION      Family Psychiatric  History: None.  Family History:  Family History  Problem Relation Age of Onset  . Breast cancer Mother 39  . Diabetes Brother   . Hypertension Brother   . Stroke Brother   . Breast cancer Maternal Grandmother        dx >50  . Breast cancer Maternal Aunt        dx <50  . Colon cancer Paternal Aunt   . Cancer Paternal Uncle        type unk  . Breast cancer Maternal Aunt        dx >50  . Cancer Paternal Aunt   . Cancer Paternal Uncle     Social History:  Social History   Socioeconomic History  . Marital status: Married     Spouse name: Not on file  . Number of children: 4  . Years of education: 8  . Highest education level: Not on file  Occupational History  . 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 Smoking status: Never Smoker  . Smokeless tobacco: Never Used  Substance and Sexual Activity  . Alcohol use: No    Alcohol/week: 0.0 standard drinks  . Drug use: No  . Sexual activity: Not Currently    Partners: Male    Birth control/protection: Post-menopausal  Other Topics Concern  . Not on file  Social History Narrative   Lives with daughter, Rebekah "Nicole Kindred"   Caffeine use: coffee/tea/soda daily   Social Determinants of Health   Financial Resource Strain:   . Difficulty of Paying Living Expenses: Not on file  Food Insecurity:   . Worried About Charity fundraiser in the Last Year: Not on file  . Ran Out of Food in the Last Year: Not on file  Transportation Needs:   . Lack of Transportation (Medical): Not on file  . Lack of Transportation (Non-Medical): Not on file  Physical Activity:   . Days of Exercise per Week: Not on file  . Minutes of Exercise per Session: Not on file  Stress:   . Feeling of Stress : Not on file  Social Connections:   . Frequency of Communication with Friends and Family: Not on file  . Frequency of Social Gatherings with Friends and Family: Not on file  . Attends Religious Services: Not on file  . Active Member of Clubs or Organizations: Not on file  . Attends Archivist Meetings: Not on file  . Marital Status: Not on file    Allergies:  Allergies  Allergen Reactions  . Ambien [Zolpidem Tartrate] Other (See Comments)    Other reaction(s): Hallucination  . Soma [Carisoprodol] Other (See Comments)    chills    Metabolic Disorder Labs: Lab Results  Component Value Date   HGBA1C 5.9 (H) 08/24/2014   MPG 123 (H) 08/24/2014   No results found for: PROLACTIN Lab Results  Component Value Date   CHOL 193 08/24/2014   TRIG 110 08/24/2014   HDL 58  08/24/2014   CHOLHDL 3.3 08/24/2014   VLDL 22 08/24/2014   LDLCALC 113 08/24/2014   Lab Results  Component Value Date   TSH 1.030 11/07/2016   TSH 1.12 09/21/2015    Therapeutic Level Labs: No results found for: LITHIUM No results found for: VALPROATE No components found for:  CBMZ  Current Medications: Current Outpatient Medications  Medication Sig Dispense Refill  . ARIPiprazole (ABILIFY) 2 MG tablet Take 1 tablet (2 mg total) by mouth at bedtime. 30 tablet 2  . diphenhydrAMINE  HCl, Sleep, (SLEEP AID) 25 MG CAPS Take by mouth.    . DULoxetine (CYMBALTA) 60 MG capsule Take 1 capsule (60 mg total) by mouth 2 (two) times daily. 99991111 capsule 0  . folic acid (FOLVITE) 1 MG tablet     . lamoTRIgine (LAMICTAL) 100 MG tablet 150 mg 2 (two) times daily.     Marland Kitchen levETIRAcetam (KEPPRA) 1000 MG tablet Take 1,500 mg by mouth 2 (two) times daily.     Marland Kitchen LORazepam (ATIVAN) 0.5 MG tablet Take 1 tablet (0.5 mg total) by mouth every 8 (eight) hours as needed for anxiety or sleep. 90 tablet 2  . methotrexate 2.5 MG tablet Take 15 mg by mouth once a week.    . Multiple Vitamin (MULTIVITAMIN) capsule Take 1 capsule by mouth daily.    . naproxen (NAPROSYN) 500 MG tablet Take 1 tablet (500 mg total) by mouth 2 (two) times daily with a meal. (Patient not taking: Reported on 05/29/2018) 60 tablet 1  . predniSONE (DELTASONE) 5 MG tablet     . ranitidine (ZANTAC) 150 MG capsule Take 1 capsule (150 mg total) by mouth 2 (two) times daily. 60 capsule 1   No current facility-administered medications for this visit.     Psychiatric Specialty Exam: Review of Systems  Constitutional: Positive for fatigue.  Gastrointestinal: Positive for nausea.  Musculoskeletal: Positive for arthralgias and myalgias.  Psychiatric/Behavioral: Positive for sleep disturbance. The patient is nervous/anxious.   All other systems reviewed and are negative.   There were no vitals taken for this visit.There is no height or weight on  file to calculate BMI.  General Appearance: Casual and Fairly Groomed  Eye Contact:  Fair  Speech:  Clear and Coherent and Normal Rate  Volume:  Normal  Mood:  Anxious and Depressed  Affect:  Constricted  Thought Process:  Goal Directed and Linear  Orientation:  Full (Time, Place, and Person)  Thought Content: Rumination   Suicidal Thoughts:  No  Homicidal Thoughts:  No  Memory:  Immediate;   Good Recent;   Good Remote;   Good  Judgement:  Good  Insight:  Fair  Psychomotor Activity:  Normal  Concentration:  Concentration: Fair  Recall:  Good  Fund of Knowledge: Fair  Language: Good  Akathisia:  Negative  Handed:  Right  AIMS (if indicated): not done  Assets:  Communication Skills Desire for Improvement Financial Resources/Insurance Housing Resilience Social Support  ADL's:  Intact  Cognition: WNL  Sleep:  Fair   Screenings: PHQ2-9     Telemedicine from 05/29/2018 in [place] at [place] from 03/13/2018 in [place] at [place] from 01/03/2018 in [place] at [place] from 11/29/2017 in [place] at [place] from 10/03/2017 in Primary Care at [facility] Total Score  5  4  0  0  4  PHQ-9 Total Score  13  15  --  --  13       Assessment and Plan: 64 yo separated Latina with a long history of depression and anxiety. She states that first time she experienced depression was in April 10, 1976 after her mother died.  Then in the 80s she lost her first husband because he drank a lot.  She is never really had any psychiatric treatment though.  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  She is receiving treatment from  [facility]: completed 5 immunotherapy treatments which had to be stopped due to developing colitis. She just restarted chemotherapy (had it also in 2019). Medications for  depression/anxiety have been prescribed by her oncologist but she has not been consistent with taking some of them. Dr. Harrington Challenger continued duloxetine 60 mg bid and added aripiprazole 2 mg at HS and lorazepam 0.5 mg at HS for sleep/anxiety. Makaelyn reports that she has on and off problems with intial insomnia and feels anxious most of the time. She does not feel suicidal but rather hopeless about her marriage. Her husband of 4 years abruptly left her in 2017 and went to [place]. They are still in touch with each other. She feels tired most of the time and felt nauseated for 4-5 days after first course of chemo (takes Zofran prn nausea).  Dx: GAD; MDD recurrent moderate.  Plan: Continue duloxetine 60 mg bid, aripiprazole 2 mg at HS and change lorazepam to 0.5 mg tid prn anxiety/sleep. We may increase aripiprazole to 5 mg  Next unless mod improves some. Next visit in one month. The plan was discussed with patient who had an opportunity to ask questions and these were all answered. I spend 35 minutes in videoconferencing with the patient.  Stephanie Acre, MD 03/10/2019, 11:25 AM

## 2019-03-11 ENCOUNTER — Ambulatory Visit (INDEPENDENT_AMBULATORY_CARE_PROVIDER_SITE_OTHER): Payer: Medicare Other | Admitting: Licensed Clinical Social Worker

## 2019-03-11 DIAGNOSIS — F411 Generalized anxiety disorder: Secondary | ICD-10-CM

## 2019-03-11 DIAGNOSIS — F331 Major depressive disorder, recurrent, moderate: Secondary | ICD-10-CM | POA: Diagnosis not present
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
## 2019-04-06 ENCOUNTER — Ambulatory Visit (INDEPENDENT_AMBULATORY_CARE_PROVIDER_SITE_OTHER): Payer: Medicare Other | Admitting: Licensed Clinical Social Worker

## 2019-04-06 DIAGNOSIS — F411 Generalized anxiety disorder: Secondary | ICD-10-CM
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
## 2019-04-08 ENCOUNTER — Ambulatory Visit (HOSPITAL_COMMUNITY): Payer: Medicare Other | Admitting: Psychiatry

## 2019-04-13 ENCOUNTER — Ambulatory Visit (INDEPENDENT_AMBULATORY_CARE_PROVIDER_SITE_OTHER): Payer: Medicare Other | Admitting: Licensed Clinical Social Worker

## 2019-04-13 DIAGNOSIS — F411 Generalized anxiety disorder: Secondary | ICD-10-CM

## 2019-04-13 NOTE — Progress Notes (Signed)
Virtual Visit via Video Note  I connected with Rebekah [name] on 04/13/19 at  1:00 PM EDT by a video enabled telemedicine application and verified that I am speaking with the correct person using two identifiers.  Location: Patient: Home Provider: Office   I discussed the limitations of evaluation and management by telemedicine and the availability of in person appointments. The patient expressed understanding and agreed to proceed.   THERAPIST PROGRESS NOTE  Session Time: 1:00 pm-1:45 pm  Participation Level: Active  Behavioral Response: CasualAlertAnxious and Depressed  Type of Therapy: Individual Therapy  Treatment Goals addressed: Coping  Interventions: CBT and Solution Focused  Case Summary: Rebekah [name] is a 64 y.o. female who presents oriented x5 (person, place,situation, time and object), casually dressed, appropriately groomed, average height, overweight, and cooperative to address mood and anxiety. Patient has a history of medical treatment including fibromyalgia, endometrial cancer, and high blood pressure. Patient is going through cancer treatment for the second time. Patient has a history of mental health treatment including medication management. Patient denies suicidal and homicidal ideations. Patient denies psychosis including auditory and visual hallucinations. Patient denies substance abuse. Patient is at low risk for lethality.   Session#2  Physically: Patient continues to have difficulty falling asleep due to fear. Patient's energy level is medium. She has some activity with her grandchildren but gets worn out easily. Patient has chronic back pain.  Spiritually/values: Patient prays at times and asks others to pray for her. She is concerned with some of the hypocrisy in the [employer] culture.  Relationships: Patient is getting along with her family. Patient shared she wants to leave a legacy of love and that she wants people to remember her as  a good person and not her past. Patient is creating memories with her grandchildren.  Emotionally/Mentally/Behavior: Patient's mood has been pretty good. Patient thinks about her own mortality. She worries about how her children will be when she passes away. Patient also noted that her anxiety increases when her grandchildren get loud in the evening. Patient leaves the room before she gets irritable.   Patient engaged in session. Patient responded well to interventions. Patient continues to meet criteria for Major depressive disorder, recurrent episode, moderate and Generalized Anxiety Disorder. Patient will continue in outpatient therapy due to being the least restrictive service to meet her needs. Patient made minimal progress on her goals.   Suicidal/Homicidal: Negativewithout intent/plan  Therapist Response: Therapist reviewed patient's recent thoughts and behaviors. Therapist utilized CBT to address mood and anxiety. Therapist processed patient's feelings to identify triggers for mood and anxiety. Therapist discussed with patient her anxiety related to sleep and leaving a legacy for her children and grandchildren.   Plan: Return again in 1 weeks.  Diagnosis: Axis I: Generalized Anxiety Disorder and Major depressive disorder, recurrent episode, moderate    Axis II: No diagnosis  I discussed the assessment and treatment plan with the patient. The patient was provided an opportunity to ask questions and all were answered. The patient agreed with the plan and demonstrated an understanding of the instructions.   The patient was advised to call back or seek an in-person evaluation if the symptoms worsen or if the condition fails to improve as anticipated.  I provided 45 minutes of non-face-to-face time during this encounter.   Glori Bickers, LCSW 04/13/2019

## 2019-04-20 ENCOUNTER — Ambulatory Visit (INDEPENDENT_AMBULATORY_CARE_PROVIDER_SITE_OTHER): Payer: Medicare Other | Admitting: Psychiatry

## 2019-04-20 ENCOUNTER — Other Ambulatory Visit: Payer: Self-pay

## 2019-04-20 DIAGNOSIS — M797 Fibromyalgia: Secondary | ICD-10-CM | POA: Diagnosis not present

## 2019-04-20 DIAGNOSIS — F330 Major depressive disorder, recurrent, mild: Secondary | ICD-10-CM

## 2019-04-20 DIAGNOSIS — F411 Generalized anxiety disorder: Secondary | ICD-10-CM

## 2019-04-20 MED ORDER — ARIPIPRAZOLE 2 MG PO TABS: 2.0000 mg | ORAL_TABLET | Freq: Every day | ORAL | 2 refills | Status: DC

## 2019-04-20 MED ORDER — DULOXETINE HCL 60 MG PO CPEP: 60.0000 mg | ORAL_CAPSULE | Freq: Two times a day (BID) | ORAL | 0 refills | Status: DC

## 2019-04-20 NOTE — Progress Notes (Signed)
[facility] MD/PA/NP OP Progress Note  04/20/2019 10:41 AM Rebekah [name]  MRN:  [id number] Interview was conducted by phone and I verified that I was speaking with the correct person using two identifiers. I discussed the limitations of evaluation and management by telemedicine and  the availability of in person appointments. Patient expressed understanding and agreed to proceed.  Chief Complaint: "I'm fine".  HPI: 64 yo separated Latina with a long history of depression and anxiety. She states that first time she experienced depression was in Apr 14, 1976 after her mother died. Then in the 80s she lost her first husband because he drank a lot. She is never really had any psychiatric treatment though. 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 She is receiving treatment from [facility]: completed 5 immunotherapy treatments which had to be stopped due to developing colitis. She restarted chemotherapy (had it also in 14-Apr-2017). Medications for depression/anxiety have been prescribed by her oncologist but she has not been consistent with taking some of them. Dr. Harrington [name] continued duloxetine 60 mg bid and added aripiprazole 2 mg at HS and lorazepam 0.5 mg at HS for sleep/anxiety (I changed to tid prn but she uses it rarely during the day). Rebekah [name] reports that she has on and off problems with intial insomnia and feels anxious most of the time. She does not feel suicidal but rather hopeless about her marriage. Her husband of 4 years abruptly left her in 04-15-2015 and went to [place]. They are still in touch with each other. Mood improved overall and anxiety is low. She is in counseling with Rebekah [name].  Visit Diagnosis:    ICD-10-CM   1. Mild episode of recurrent major depressive disorder (HCC)  F33.0 DULoxetine (CYMBALTA) 60 MG capsule  2. Fibromyalgia  M79.7 DULoxetine (CYMBALTA) 60  MG capsule  3. Generalized anxiety disorder  F41.1     Past Psychiatric History: Please see intake H&P.  Past Medical History:  Past Medical History:  Diagnosis Date  . Anxiety   . Arthritis   . Asthma    NO INHALERS USED  . Cancer ([place])    ENDOMETRIAL  . Depression   . Family history of breast cancer   . Family history of colon cancer   . Fibromyalgia   . GERD (gastroesophageal reflux disease)   . Insomnia   . Seizure disorder ([place])    LAST SEIZURE FEBRUARY Apr 14, 2017 SEES [facility]   . Seizures ([place])     Past Surgical History:  Procedure Laterality Date  . ABDOMINAL HYSTERECTOMY    . BREAST BIOPSY Right    biopsy in the 1990's  . BUNIONECTOMY Bilateral    2000's - R ONCE and L once  . CARPAL TUNNEL RELEASE Bilateral   . COLONSCOPY    . ENDOMETRIAL BIOPSY  02/2017  . IR FLUORO GUIDE PORT INSERTION RIGHT  04/24/2017  . IR REMOVAL TUN ACCESS W/ PORT W/O FL MOD SED  10/07/2017  . IR US GUIDE VASC ACCESS RIGHT  04/24/2017  . ROBOTIC ASSISTED TOTAL HYSTERECTOMY WITH BILATERAL SALPINGO OOPHERECTOMY Bilateral 03/26/2017   Procedure: XI ROBOTIC ASSISTED TOTAL HYSTERECTOMY WITH BILATERAL SALPINGO OOPHORECTOMY AND SENTINAL LYMPH NODES;  Surgeon: Rebekah Amber, MD;  Location: [facility];  Service: Gynecology;  Laterality: Bilateral;  . TUBAL LIGATION  1982-04-15  . TUBAL LIGATION      Family Psychiatric History: None.  Family History:  Family  History  Problem Relation Age of Onset  . Breast cancer Mother 27  . Diabetes Brother   . Hypertension Brother   . Stroke Brother   . Breast cancer Maternal Grandmother        dx >50  . Breast cancer Maternal Aunt        dx <50  . Colon cancer Paternal Aunt   . Cancer Paternal Uncle        type unk  . Breast cancer Maternal Aunt        dx >50  . Cancer Paternal Aunt   . Cancer Paternal Uncle     Social History:  Social History   Socioeconomic History  . Marital status: Married    Spouse name: Not on file  . Number of  children: 4  . Years of education: 8  . Highest education level: Not on file  Occupational History  . 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 Smoking status: Never Smoker  . Smokeless tobacco: Never Used  Substance and Sexual Activity  . Alcohol use: No    Alcohol/week: 0.0 standard drinks  . Drug use: No  . Sexual activity: Not Currently    Partners: Male    Birth control/protection: Post-menopausal  Other Topics Concern  . Not on file  Social History Narrative   Lives with daughter, Rebekah [name] "Nicole Kindred"   Caffeine use: coffee/tea/soda daily   Social Determinants of Health   Financial Resource Strain:   . Difficulty of Paying Living Expenses:   Food Insecurity:   . Worried About Charity fundraiser in the Last Year:   . Arboriculturist in the Last Year:   Transportation Needs:   . Film/video editor (Medical):   Marland Kitchen Lack of Transportation (Non-Medical):   Physical Activity:   . Days of Exercise per Week:   . Minutes of Exercise per Session:   Stress:   . Feeling of Stress :   Social Connections:   . Frequency of Communication with Friends and Family:   . Frequency of Social Gatherings with Friends and Family:   . Attends Religious Services:   . Active Member of Clubs or Organizations:   . Attends Archivist Meetings:   Marland Kitchen Marital Status:     Allergies:  Allergies  Allergen Reactions  . Ambien [Zolpidem Tartrate] Other (See Comments)    Other reaction(s): Hallucination  . Soma [Carisoprodol] Other (See Comments)    chills    Metabolic Disorder Labs: Lab Results  Component Value Date   HGBA1C 5.9 (H) 08/24/2014   MPG 123 (H) 08/24/2014   No results found for: PROLACTIN Lab Results  Component Value Date   CHOL 193 08/24/2014   TRIG 110 08/24/2014   HDL 58 08/24/2014   CHOLHDL 3.3 08/24/2014   VLDL 22 08/24/2014   LDLCALC 113 08/24/2014   Lab Results  Component Value Date   TSH 1.030 11/07/2016   TSH 1.12 09/21/2015    Therapeutic Level  Labs: No results found for: LITHIUM No results found for: VALPROATE No components found for:  CBMZ  Current Medications: Current Outpatient Medications  Medication Sig Dispense Refill  . ARIPiprazole (ABILIFY) 2 MG tablet Take 1 tablet (2 mg total) by mouth at bedtime. 30 tablet 2  . diphenhydrAMINE HCl, Sleep, (SLEEP AID) 25 MG CAPS Take by mouth.    . DULoxetine (CYMBALTA) 60 MG capsule Take 1 capsule (60 mg total) by mouth 2 (two) times daily. 180 capsule 0  .  folic acid (FOLVITE) 1 MG tablet     . lamoTRIgine (LAMICTAL) 100 MG tablet 150 mg 2 (two) times daily.     Marland Kitchen levETIRAcetam (KEPPRA) 1000 MG tablet Take 1,500 mg by mouth 2 (two) times daily.     Marland Kitchen LORazepam (ATIVAN) 0.5 MG tablet Take 1 tablet (0.5 mg total) by mouth every 8 (eight) hours as needed for anxiety or sleep. 90 tablet 2  . methotrexate 2.5 MG tablet Take 15 mg by mouth once a week.    . Multiple Vitamin (MULTIVITAMIN) capsule Take 1 capsule by mouth daily.    . naproxen (NAPROSYN) 500 MG tablet Take 1 tablet (500 mg total) by mouth 2 (two) times daily with a meal. (Patient not taking: Reported on 05/29/2018) 60 tablet 1  . predniSONE (DELTASONE) 5 MG tablet     . ranitidine (ZANTAC) 150 MG capsule Take 1 capsule (150 mg total) by mouth 2 (two) times daily. 60 capsule 1   No current facility-administered medications for this visit.     Psychiatric Specialty Exam: Review of Systems  Psychiatric/Behavioral: Positive for sleep disturbance. The patient is nervous/anxious.   All other systems reviewed and are negative.   There were no vitals taken for this visit.There is no height or weight on file to calculate BMI.  General Appearance: NA  Eye Contact:  NA  Speech:  Clear and Coherent and Normal Rate  Volume:  Normal  Mood:  Residual depression and some anxiety.  Affect:  NA  Thought Process:  Goal Directed and Linear  Orientation:  Full (Time, Place, and Person)  Thought Content: Logical   Suicidal Thoughts:   No  Homicidal Thoughts:  No  Memory:  Immediate;   Good Recent;   Good Remote;   Good  Judgement:  Good  Insight:  Fair  Psychomotor Activity:  NA  Concentration:  Concentration: Good  Recall:  Good  Fund of Knowledge: Fair  Language: Good  Akathisia:  Negative  Handed:  Right  AIMS (if indicated): not done  Assets:  Communication Skills Desire for Improvement Financial Resources/Insurance Housing Resilience  ADL's:  Intact  Cognition: WNL  Sleep:  Fair   Screenings: PHQ2-9     Telemedicine from 05/29/2018 in Primary Care at [facility] from 03/13/2018 in Primary Care at [facility] [place] from 01/03/2018 in Primary Care at [facility] from 11/29/2017 in Primary Care at [place] from 10/03/2017 in Primary Care at [facility] Total Score  5  4  0  0  4  PHQ-9 Total Score  13  15  -  -  13       Assessment and Plan: 64 yo separated Latina with a long history of depression and anxiety. She states that first time she experienced depression was in 1976/04/29 after her mother died. Then in the 80s she lost her first husband because he drank a lot. She is never really had any psychiatric treatment though. 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 She is receiving treatment from [facility]: completed 5 immunotherapy treatments which had to be stopped due to developing colitis. She restarted chemotherapy (had it also in 04-29-17). Medications for depression/anxiety have been prescribed by her oncologist but she has not been consistent with taking some of them. Dr. Harrington [name] continued duloxetine 60 mg bid and added aripiprazole 2 mg at HS and lorazepam 0.5  mg at HS for sleep/anxiety (I changed to tid prn but she uses it rarely during the day). Shaquina reports that she has on and off problems with intial insomnia and feels anxious most of  the time. She does not feel suicidal but rather hopeless about her marriage. Her husband of 4 years abruptly left her in 2017 and went to [place]. They are still in touch with each other. Mood improved overall and anxiety is low. She is in counseling with Rebekah [name].  Dx: GAD; MDD recurrent mild to moderate.  Plan: Continue duloxetine 60 mg bid, aripiprazole 2 mg at HS and lorazepam to 0.5 mg tid prn anxiety/sleep. Next visit in three month. The plan was discussed with patient who had an opportunity to ask questions and these were all answered. I spend 20 minutes in phone consultation with the patient.    Stephanie Acre, MD 04/20/2019, 10:41 AM

## 2019-05-13 ENCOUNTER — Observation Stay (HOSPITAL_COMMUNITY): Payer: Medicare Other

## 2019-05-13 ENCOUNTER — Inpatient Hospital Stay (HOSPITAL_COMMUNITY)
Admission: EM | Admit: 2019-05-13 | Discharge: 2019-05-17 | DRG: 312 | Disposition: A | Discharge status: Home / Self Care | Payer: Medicare Other | Attending: Internal Medicine | Admitting: Internal Medicine

## 2019-05-13 ENCOUNTER — Other Ambulatory Visit: Payer: Self-pay

## 2019-05-13 ENCOUNTER — Emergency Department (HOSPITAL_COMMUNITY): Payer: Medicare Other

## 2019-05-13 ENCOUNTER — Encounter (HOSPITAL_COMMUNITY): Payer: Self-pay | Admitting: *Deleted

## 2019-05-13 DIAGNOSIS — M1990 Unspecified osteoarthritis, unspecified site: Secondary | ICD-10-CM | POA: Diagnosis present

## 2019-05-13 DIAGNOSIS — F331 Major depressive disorder, recurrent, moderate: Secondary | ICD-10-CM | POA: Diagnosis present

## 2019-05-13 DIAGNOSIS — F411 Generalized anxiety disorder: Secondary | ICD-10-CM | POA: Diagnosis present

## 2019-05-13 DIAGNOSIS — M797 Fibromyalgia: Secondary | ICD-10-CM | POA: Diagnosis present

## 2019-05-13 DIAGNOSIS — I2510 Atherosclerotic heart disease of native coronary artery without angina pectoris: Secondary | ICD-10-CM | POA: Diagnosis present

## 2019-05-13 DIAGNOSIS — Z85118 Personal history of other malignant neoplasm of bronchus and lung: Secondary | ICD-10-CM

## 2019-05-13 DIAGNOSIS — Z803 Family history of malignant neoplasm of breast: Secondary | ICD-10-CM

## 2019-05-13 DIAGNOSIS — J449 Chronic obstructive pulmonary disease, unspecified: Secondary | ICD-10-CM | POA: Diagnosis present

## 2019-05-13 DIAGNOSIS — Z90710 Acquired absence of both cervix and uterus: Secondary | ICD-10-CM

## 2019-05-13 DIAGNOSIS — E876 Hypokalemia: Secondary | ICD-10-CM | POA: Diagnosis not present

## 2019-05-13 DIAGNOSIS — T451X5A Adverse effect of antineoplastic and immunosuppressive drugs, initial encounter: Secondary | ICD-10-CM | POA: Diagnosis present

## 2019-05-13 DIAGNOSIS — Z8542 Personal history of malignant neoplasm of other parts of uterus: Secondary | ICD-10-CM

## 2019-05-13 DIAGNOSIS — Z20822 Contact with and (suspected) exposure to covid-19: Secondary | ICD-10-CM | POA: Diagnosis present

## 2019-05-13 DIAGNOSIS — R55 Syncope and collapse: Secondary | ICD-10-CM | POA: Diagnosis present

## 2019-05-13 DIAGNOSIS — R569 Unspecified convulsions: Secondary | ICD-10-CM

## 2019-05-13 DIAGNOSIS — G4700 Insomnia, unspecified: Secondary | ICD-10-CM | POA: Diagnosis present

## 2019-05-13 DIAGNOSIS — Z8249 Family history of ischemic heart disease and other diseases of the circulatory system: Secondary | ICD-10-CM

## 2019-05-13 DIAGNOSIS — N182 Chronic kidney disease, stage 2 (mild): Secondary | ICD-10-CM | POA: Diagnosis present

## 2019-05-13 DIAGNOSIS — I951 Orthostatic hypotension: Secondary | ICD-10-CM | POA: Diagnosis not present

## 2019-05-13 DIAGNOSIS — C541 Malignant neoplasm of endometrium: Secondary | ICD-10-CM | POA: Diagnosis not present

## 2019-05-13 DIAGNOSIS — E860 Dehydration: Secondary | ICD-10-CM | POA: Diagnosis present

## 2019-05-13 DIAGNOSIS — K219 Gastro-esophageal reflux disease without esophagitis: Secondary | ICD-10-CM | POA: Diagnosis present

## 2019-05-13 DIAGNOSIS — Z800 Family history of malignant neoplasm of digestive organs: Secondary | ICD-10-CM

## 2019-05-13 DIAGNOSIS — R296 Repeated falls: Secondary | ICD-10-CM | POA: Diagnosis present

## 2019-05-13 DIAGNOSIS — N179 Acute kidney failure, unspecified: Secondary | ICD-10-CM | POA: Diagnosis present

## 2019-05-13 DIAGNOSIS — K529 Noninfective gastroenteritis and colitis, unspecified: Secondary | ICD-10-CM | POA: Diagnosis present

## 2019-05-13 DIAGNOSIS — G40909 Epilepsy, unspecified, not intractable, without status epilepticus: Secondary | ICD-10-CM | POA: Diagnosis present

## 2019-05-13 DIAGNOSIS — D696 Thrombocytopenia, unspecified: Secondary | ICD-10-CM | POA: Diagnosis present

## 2019-05-13 DIAGNOSIS — Z79899 Other long term (current) drug therapy: Secondary | ICD-10-CM

## 2019-05-13 DIAGNOSIS — C7800 Secondary malignant neoplasm of unspecified lung: Secondary | ICD-10-CM | POA: Diagnosis present

## 2019-05-13 DIAGNOSIS — Z833 Family history of diabetes mellitus: Secondary | ICD-10-CM

## 2019-05-13 DIAGNOSIS — Z823 Family history of stroke: Secondary | ICD-10-CM

## 2019-05-13 DIAGNOSIS — E785 Hyperlipidemia, unspecified: Secondary | ICD-10-CM | POA: Diagnosis present

## 2019-05-13 LAB — MAGNESIUM: Magnesium: 1.8 mg/dL (ref 1.7–2.4)

## 2019-05-13 LAB — CBC
HCT: 38.3 % (ref 36.0–46.0)
Hemoglobin: 13.1 g/dL (ref 12.0–15.0)
MCH: 33.2 pg (ref 26.0–34.0)
MCHC: 34.2 g/dL (ref 30.0–36.0)
MCV: 97 fL (ref 80.0–100.0)
Platelets: 152 10*3/uL (ref 150–400)
RBC: 3.95 MIL/uL (ref 3.87–5.11)
RDW: 15 % (ref 11.5–15.5)
WBC: 3.7 10*3/uL — ABNORMAL LOW (ref 4.0–10.5)
nRBC: 0 % (ref 0.0–0.2)

## 2019-05-13 LAB — URINALYSIS, ROUTINE W REFLEX MICROSCOPIC
Bilirubin Urine: NEGATIVE
Glucose, UA: NEGATIVE mg/dL
Ketones, ur: 5 mg/dL — AB
Nitrite: NEGATIVE
Protein, ur: 30 mg/dL — AB
Specific Gravity, Urine: 1.017 (ref 1.005–1.030)
pH: 5 (ref 5.0–8.0)

## 2019-05-13 LAB — BASIC METABOLIC PANEL
Anion gap: 15 (ref 5–15)
BUN: 22 mg/dL (ref 8–23)
CO2: 28 mmol/L (ref 22–32)
Calcium: 9.5 mg/dL (ref 8.9–10.3)
Chloride: 90 mmol/L — ABNORMAL LOW (ref 98–111)
Creatinine, Ser: 1.4 mg/dL — ABNORMAL HIGH (ref 0.44–1.00)
GFR calc Af Amer: 47 mL/min — ABNORMAL LOW (ref >60)
GFR calc non Af Amer: 40 mL/min — ABNORMAL LOW (ref >60)
Glucose, Bld: 140 mg/dL — ABNORMAL HIGH (ref 70–99)
Potassium: 2.6 mmol/L — CL (ref 3.5–5.1)
Sodium: 133 mmol/L — ABNORMAL LOW (ref 135–145)

## 2019-05-13 LAB — RESPIRATORY PANEL BY RT PCR (FLU A&B, COVID)
Influenza A by PCR: NEGATIVE
Influenza B by PCR: NEGATIVE
SARS Coronavirus 2 by RT PCR: NEGATIVE

## 2019-05-13 LAB — CBG MONITORING, ED: Glucose-Capillary: 120 mg/dL — ABNORMAL HIGH (ref 70–99)

## 2019-05-13 LAB — TROPONIN I (HIGH SENSITIVITY)
Troponin I (High Sensitivity): 11 ng/L (ref <18)
Troponin I (High Sensitivity): 12 ng/L (ref <18)

## 2019-05-13 LAB — CK: Total CK: 137 U/L (ref 38–234)

## 2019-05-13 MED ORDER — LAMOTRIGINE 150 MG PO TABS
150.0000 mg | ORAL_TABLET | Freq: Every morning | ORAL | Status: DC
  Administered 2019-05-14 – 2019-05-17 (×4): 150 mg via ORAL
  Filled 2019-05-13 (×4): qty 1

## 2019-05-13 MED ORDER — LAMOTRIGINE 100 MG PO TABS
100.0000 mg | ORAL_TABLET | Freq: Every day | ORAL | Status: DC
  Administered 2019-05-13 – 2019-05-16 (×4): 100 mg via ORAL
  Filled 2019-05-13 (×5): qty 1

## 2019-05-13 MED ORDER — SODIUM CHLORIDE 0.9% FLUSH: 3.0000 mL | INTRAVENOUS | Status: DC | PRN

## 2019-05-13 MED ORDER — HYDROXYZINE HCL 25 MG PO TABS: 50.0000 mg | ORAL_TABLET | Freq: Every evening | ORAL | Status: DC | PRN

## 2019-05-13 MED ORDER — ENOXAPARIN SODIUM 40 MG/0.4ML ~~LOC~~ SOLN
40.0000 mg | Freq: Every day | SUBCUTANEOUS | Status: DC
  Administered 2019-05-13 – 2019-05-16 (×4): 40 mg via SUBCUTANEOUS
  Filled 2019-05-13 (×4): qty 0.4

## 2019-05-13 MED ORDER — ARIPIPRAZOLE 2 MG PO TABS
2.0000 mg | ORAL_TABLET | Freq: Every day | ORAL | Status: DC
  Administered 2019-05-13 – 2019-05-16 (×4): 2 mg via ORAL
  Filled 2019-05-13 (×5): qty 1

## 2019-05-13 MED ORDER — ACETAMINOPHEN 650 MG RE SUPP: 650.0000 mg | Freq: Four times a day (QID) | RECTAL | Status: DC | PRN

## 2019-05-13 MED ORDER — LEVETIRACETAM 500 MG PO TABS
1500.0000 mg | ORAL_TABLET | Freq: Two times a day (BID) | ORAL | Status: DC
  Administered 2019-05-13 – 2019-05-17 (×8): 1500 mg via ORAL
  Filled 2019-05-13 (×8): qty 3

## 2019-05-13 MED ORDER — DOCUSATE SODIUM 100 MG PO CAPS
100.0000 mg | ORAL_CAPSULE | Freq: Two times a day (BID) | ORAL | Status: DC
  Administered 2019-05-13: 100 mg via ORAL
  Filled 2019-05-13 (×2): qty 1

## 2019-05-13 MED ORDER — SODIUM CHLORIDE 0.9% FLUSH
3.0000 mL | Freq: Two times a day (BID) | INTRAVENOUS | Status: DC
  Administered 2019-05-16 – 2019-05-17 (×2): 3 mL via INTRAVENOUS

## 2019-05-13 MED ORDER — SODIUM CHLORIDE 0.9 % IV SOLN: 250.0000 mL | INTRAVENOUS | Status: DC | PRN

## 2019-05-13 MED ORDER — ONDANSETRON 4 MG PO TBDP: 4.0000 mg | ORAL_TABLET | Freq: Three times a day (TID) | ORAL | Status: DC | PRN

## 2019-05-13 MED ORDER — POTASSIUM CHLORIDE CRYS ER 20 MEQ PO TBCR
40.0000 meq | EXTENDED_RELEASE_TABLET | Freq: Once | ORAL | Status: AC
  Administered 2019-05-13: 15:00:00 40 meq via ORAL
  Filled 2019-05-13: qty 2

## 2019-05-13 MED ORDER — POTASSIUM CHLORIDE 10 MEQ/100ML IV SOLN
10.0000 meq | Freq: Once | INTRAVENOUS | Status: AC
  Administered 2019-05-13: 10 meq via INTRAVENOUS
  Filled 2019-05-13: qty 100

## 2019-05-13 MED ORDER — MELATONIN 3 MG PO TABS
12.0000 mg | ORAL_TABLET | Freq: Every evening | ORAL | Status: DC | PRN
  Administered 2019-05-13: 12 mg via ORAL
  Filled 2019-05-13: qty 4

## 2019-05-13 MED ORDER — FAMOTIDINE 20 MG PO TABS
20.0000 mg | ORAL_TABLET | Freq: Two times a day (BID) | ORAL | Status: DC
  Administered 2019-05-13 – 2019-05-17 (×8): 20 mg via ORAL
  Filled 2019-05-13 (×8): qty 1

## 2019-05-13 MED ORDER — PROCHLORPERAZINE MALEATE 10 MG PO TABS
10.0000 mg | ORAL_TABLET | Freq: Four times a day (QID) | ORAL | Status: DC | PRN
  Filled 2019-05-13: qty 1

## 2019-05-13 MED ORDER — SODIUM CHLORIDE 0.9% FLUSH
3.0000 mL | Freq: Once | INTRAVENOUS | Status: AC
  Administered 2019-05-14: 3 mL via INTRAVENOUS

## 2019-05-13 MED ORDER — DULOXETINE HCL 60 MG PO CPEP
60.0000 mg | ORAL_CAPSULE | Freq: Two times a day (BID) | ORAL | Status: DC
  Administered 2019-05-13 – 2019-05-17 (×8): 60 mg via ORAL
  Filled 2019-05-13 (×9): qty 1

## 2019-05-13 MED ORDER — LORAZEPAM 0.5 MG PO TABS: 0.5000 mg | ORAL_TABLET | Freq: Three times a day (TID) | ORAL | Status: DC | PRN

## 2019-05-13 MED ORDER — SODIUM CHLORIDE 0.9 % IV BOLUS
1000.0000 mL | Freq: Once | INTRAVENOUS | Status: AC
  Administered 2019-05-13: 15:00:00 1000 mL via INTRAVENOUS

## 2019-05-13 MED ORDER — LAMOTRIGINE 100 MG PO TABS: 100.0000 mg | ORAL_TABLET | ORAL | Status: DC

## 2019-05-13 MED ORDER — SODIUM CHLORIDE 0.9 % IV BOLUS
1000.0000 mL | Freq: Once | INTRAVENOUS | Status: AC
  Administered 2019-05-13: 18:00:00 1000 mL via INTRAVENOUS

## 2019-05-13 MED ORDER — FOLIC ACID 1 MG PO TABS
1.0000 mg | ORAL_TABLET | Freq: Every day | ORAL | Status: DC
  Administered 2019-05-13 – 2019-05-17 (×5): 1 mg via ORAL
  Filled 2019-05-13 (×5): qty 1

## 2019-05-13 MED ORDER — POTASSIUM CHLORIDE CRYS ER 20 MEQ PO TBCR
20.0000 meq | EXTENDED_RELEASE_TABLET | Freq: Every day | ORAL | Status: DC
  Administered 2019-05-13: 20 meq via ORAL
  Filled 2019-05-13: qty 1

## 2019-05-13 MED ORDER — ACETAMINOPHEN 325 MG PO TABS: 650.0000 mg | ORAL_TABLET | Freq: Four times a day (QID) | ORAL | Status: DC | PRN

## 2019-05-13 NOTE — ED Notes (Signed)
While being helped from wheelchair to stretcher by [facility] emt, pt lost her footing and fell. Pt hit back of head by R ear. Pt does not take blood thinners

## 2019-05-13 NOTE — ED Notes (Signed)
Patient transported to CT 

## 2019-05-13 NOTE — H&P (Signed)
History and Physical    Rebekah [name] [id number] DOB: 07-17-1955 DOA: 05/13/2019  PCP: Rudene Anda, MD (Confirm with patient/family/NH records and if not entered, this has to be entered at [facility] point of entry) Patient coming from: home  I have personally briefly reviewed patient's old medical records in Page  Chief Complaint: multiple syncopal episodes  HPI: Rebekah [name] is a 64 y.o. female with medical history significant of active endometrial cancer with mets to the lungs per patient, on chemotherapy, MDD, seizures on Lamictal and Keppra presents to the ER for syncopal episode that occurred Tuesday night..  Patient is followed by Dr. Claiborne Billings for her cancer through [facility].  Patient states she received chemotherapy last Wednesday, and since then has not been feeling herself.  Patient notes increased weakness, getting tired more easily, dizziness and losing her balance.  Dizziness worse with movement, but does not occur at rest.  She has had 2 falls within this last week including 1 in the ER as she was transferring to her bed in the room per triage notes.  She reports syncopal episode that brought her into the hospital occurred in her bathroom. She states she was going to the bathroom and then she felt woozy and passed after calling out her daughter's name. Her daughter found her on the floor up against the bathtube face down. The daughter rolled her over and the patient seemed to regain conciousness with a minute or two.  She does have a history of seizures. She  reports compliance with medications.  This episode did feel like previous seizures. She does not note a change in her appetite or fluid consumption.  She denies any fevers, chills, shortness of breath, chest pain, back pain, abdominal pain, nausea, vomiting, dysuria, hematuria, blood in stool, constipation.  She does endorse chronic diarrhea which she has been having for the last 10 months  which she attributes to her chemotherapy regimen.  Per chart review in Care Everywhere, it appears that the patient has tried multiple medications, has had a colonoscopy which was negative.   ED Course: Afebrile. Peer EDP patient did have drop in BP with position change. CT head negative. Lab notable for K 2.6, Mg 1.8, nl CBC. Troponin #1 11, #2 12. Patient was given 1 L NS bolus. She received 10 meq K IV and 20 meq orally. [facility] is called to admit patient for further evaluation and management.  Review of Systems: As per HPI otherwise 10 point review of systems negative.    Past Medical History:  Diagnosis Date  . Anxiety   . Arthritis   . Asthma    NO INHALERS USED  . Cancer ([place])    ENDOMETRIAL  . Depression   . Family history of breast cancer   . Family history of colon cancer   . Fibromyalgia   . GERD (gastroesophageal reflux disease)   . Insomnia   . Seizure disorder ([place])    LAST SEIZURE FEBRUARY 2019 SEES DR [facility]   . Seizures ([place])     Past Surgical History:  Procedure Laterality Date  . ABDOMINAL HYSTERECTOMY    . BREAST BIOPSY Right    biopsy in the 1990's  . BUNIONECTOMY Bilateral    2000's - R ONCE and L once  . CARPAL TUNNEL RELEASE Bilateral   . COLONSCOPY    . ENDOMETRIAL BIOPSY  02/2017  . IR FLUORO GUIDE PORT INSERTION RIGHT  04/24/2017  . IR REMOVAL  TUN ACCESS W/ PORT W/O FL MOD SED  10/07/2017  . IR US GUIDE VASC ACCESS RIGHT  04/24/2017  . ROBOTIC ASSISTED TOTAL HYSTERECTOMY WITH BILATERAL SALPINGO OOPHERECTOMY Bilateral 03/26/2017   Procedure: XI ROBOTIC ASSISTED TOTAL HYSTERECTOMY WITH BILATERAL SALPINGO OOPHORECTOMY AND SENTINAL LYMPH NODES;  Surgeon: Everitt Amber, MD;  Location: [facility] ORS;  Service: Gynecology;  Laterality: Bilateral;  . TUBAL LIGATION  1984  . TUBAL LIGATION     Soc Hx - lives with her daughter.   reports that she has never smoked. She has never used smokeless tobacco. She reports that she does not drink alcohol or use  drugs.  Allergies  Allergen Reactions  . Ambien [Zolpidem Tartrate] Other (See Comments)    Other reaction(s): Hallucination  . Soma [Carisoprodol] Other (See Comments)    chills    Family History  Problem Relation Age of Onset  . Breast cancer Mother 30  . Diabetes Brother   . Hypertension Brother   . Stroke Brother   . Breast cancer Maternal Grandmother        dx >50  . Breast cancer Maternal Aunt        dx <50  . Colon cancer Paternal Aunt   . Cancer Paternal Uncle        type unk  . Breast cancer Maternal Aunt        dx >50  . Cancer Paternal Aunt   . Cancer Paternal Uncle     Prior to Admission medications   Medication Sig Start Date End Date Taking? Authorizing Provider  ARIPiprazole (ABILIFY) 2 MG tablet Take 1 tablet (2 mg total) by mouth at bedtime. 04/20/19  Yes Pucilowski, Olgierd A, MD  DULoxetine (CYMBALTA) 60 MG capsule Take 1 capsule (60 mg total) by mouth 2 (two) times daily. 04/20/19  Yes Pucilowski, Olgierd A, MD  folic acid (FOLVITE) 1 MG tablet Take 1 mg by mouth daily.  05/18/18  Yes [provider]  hydrOXYzine (ATARAX/VISTARIL) 25 MG tablet Take 50 mg by mouth at bedtime as needed for anxiety.   Yes [provider]  lamoTRIgine (LAMICTAL) 100 MG tablet Take 100 mg by mouth See admin instructions. 150mg  in the morning, 100mg  at night. 05/11/18  Yes [provider]  levETIRAcetam (KEPPRA) 1000 MG tablet Take 1,500 mg by mouth 2 (two) times daily.    Yes [provider]  LORazepam (ATIVAN) 0.5 MG tablet Take 1 tablet (0.5 mg total) by mouth every 8 (eight) hours as needed for anxiety or sleep. 03/10/19  Yes Pucilowski, Olgierd A, MD  Melatonin 12 MG TABS Take 1 tablet by mouth as needed (sleep).   Yes [provider]  ondansetron (ZOFRAN-ODT) 4 MG disintegrating tablet Take 4 mg by mouth every 8 (eight) hours as needed for nausea or vomiting.   Yes [provider]  potassium chloride (KLOR-CON) 10 MEQ tablet  Take 20 mEq by mouth daily.   Yes [provider]  prochlorperazine (COMPAZINE) 10 MG tablet Take 10 mg by mouth every 6 (six) hours as needed for nausea.   Yes [provider]  naproxen (NAPROSYN) 500 MG tablet Take 1 tablet (500 mg total) by mouth 2 (two) times daily with a meal. Patient not taking: Reported on 05/29/2018 04/30/18   Chevis Pretty, FNP  ranitidine (ZANTAC) 150 MG capsule Take 1 capsule (150 mg total) by mouth 2 (two) times daily. Patient not taking: Reported on 05/13/2019 08/19/17   Rutherford Guys, MD    Physical Exam:  Vitals:   05/13/19 1500 05/13/19 1530 05/13/19 1630 05/13/19 1800  BP: 112/81 108/78 104/71 107/75  Pulse: (!) 106 96 97 99  Resp: 20 16 20 17   Temp:      TempSrc:      SpO2: 95% 96% 99% 94%  Weight:      Height:        Constitutional: NAD, calm, comfortable Vitals:   05/13/19 1500 05/13/19 1530 05/13/19 1630 05/13/19 1800  BP: 112/81 108/78 104/71 107/75  Pulse: (!) 106 96 97 99  Resp: 20 16 20 17   Temp:      TempSrc:      SpO2: 95% 96% 99% 94%  Weight:      Height:       General:  WNWD overweight woman in no distress Eyes: PERRL, lids and conjunctivae normal ENMT: Mucous membranes are moist. Neck: normal, supple, no masses, no thyromegaly Respiratory: clear to auscultation bilaterally, no wheezing, no crackles. Normal respiratory effort. No accessory muscle use.  Cardiovascular: Regular tachycardia. III/VI blowing late systolic murmur best at the left sternal border. No rubs / gallops. No extremity edema. 2+ pedal pulses. No carotid bruits.  Abdomen: no tenderness, no masses palpated. No hepatosplenomegaly. Bowel sounds positive.  Musculoskeletal: no clubbing / cyanosis. No joint deformity upper and lower extremities. Good ROM, no contractures. Normal muscle tone.  Skin: no rashes, lesions, ulcers. No induration. Complete alopecia noted Neurologic: CN 2-12 grossly intact. Sensation intact, 4/5 in all 4.  Psychiatric:  Normal judgment and insight. Alert and oriented x 3. Normal mood.    Labs on Admission: I have personally reviewed following labs and imaging studies  CBC: Recent Labs  Lab 05/13/19 1135  WBC 3.7*  HGB 13.1  HCT 38.3  MCV 97.0  PLT 0000000   Basic Metabolic Panel: Recent Labs  Lab 05/13/19 1135 05/13/19 1716  NA 133*  --   K 2.6*  --   CL 90*  --   CO2 28  --   GLUCOSE 140*  --   BUN 22  --   CREATININE 1.40*  --   CALCIUM 9.5  --   MG  --  1.8   GFR: Estimated Creatinine Clearance: 37.4 mL/min (A) (by C-G formula based on SCr of 1.4 mg/dL (H)). Liver Function Tests: No results for input(s): AST, ALT, ALKPHOS, BILITOT, PROT, ALBUMIN in the last 168 hours. No results for input(s): LIPASE, AMYLASE in the last 168 hours. No results for input(s): AMMONIA in the last 168 hours. Coagulation Profile: No results for input(s): INR, PROTIME in the last 168 hours. Cardiac Enzymes: Recent Labs  Lab 05/13/19 1716  CKTOTAL 137   BNP (last 3 results) No results for input(s): PROBNP in the last 8760 hours. HbA1C: No results for input(s): HGBA1C in the last 72 hours. CBG: Recent Labs  Lab 05/13/19 1503  GLUCAP 120*   Lipid Profile: No results for input(s): CHOL, HDL, LDLCALC, TRIG, CHOLHDL, LDLDIRECT in the last 72 hours. Thyroid Function Tests: No results for input(s): TSH, T4TOTAL, FREET4, T3FREE, THYROIDAB in the last 72 hours. Anemia Panel: No results for input(s): VITAMINB12, FOLATE, FERRITIN, TIBC, IRON, RETICCTPCT in the last 72 hours. Urine analysis:    Component Value Date/Time   COLORURINE YELLOW 05/13/2019 1950   APPEARANCEUR HAZY (A) 05/13/2019 1950   LABSPEC 1.017 05/13/2019 1950   PHURINE 5.0 05/13/2019 1950   GLUCOSEU NEGATIVE 05/13/2019 1950   HGBUR SMALL (A) 05/13/2019 1950   BILIRUBINUR NEGATIVE 05/13/2019 1950   BILIRUBINUR negative  11/29/2017 0956   BILIRUBINUR Small 09/29/2014 1341   KETONESUR 5 (A) 05/13/2019 1950   PROTEINUR 30 (A) 05/13/2019  1950   UROBILINOGEN 0.2 11/29/2017 0956   NITRITE NEGATIVE 05/13/2019 1950   LEUKOCYTESUR SMALL (A) 05/13/2019 1950    Radiological Exams on Admission: CT Head Wo Contrast  Result Date: 05/13/2019 CLINICAL DATA:  Head trauma, headache. Additional history provided: Recent falls and head trauma, patient with history of lung cancer, chemotherapy EXAM: CT HEAD WITHOUT CONTRAST TECHNIQUE: Contiguous axial images were obtained from the base of the skull through the vertex without intravenous contrast. COMPARISON:  No pertinent prior studies available for comparison. FINDINGS: Brain: Please note there is limited assessment for intracranial metastatic disease on this non-contrast head CT. There is no acute intracranial hemorrhage. No demarcated cortical infarct. No extra-axial fluid collection. No evidence of intracranial mass. No midline shift. Partially empty sella turcica. Vascular: No hyperdense vessel.  Atherosclerotic calcifications Skull: Negative for fracture. Multiple arachnoid pits within the floor of the middle cranial fossa. Sinuses/Orbits: Visualized orbits show no acute finding. Mild ethmoid and maxillary sinus mucosal thickening at the imaged levels. No significant mastoid effusion. IMPRESSION: 1. Please note assessment for intracranial metastatic disease is limited on this non-contrast head CT. 2. No evidence of acute intracranial hemorrhage. 3. Partially empty sella turcica with multiple middle cranial fossa arachnoid pits. These findings may be seen in the setting of idiopathic intracranial hypertension (pseudotumor cerebri). 4. Mild paranasal sinus mucosal thickening. Electronically Signed   By: Kellie Simmering DO   On: 05/13/2019 17:40   DG Chest Portable 1 View  Result Date: 05/13/2019 CLINICAL DATA:  Syncope. Active chemotherapy. Fatigue and weakness. EXAM: PORTABLE CHEST 1 VIEW COMPARISON:  None. FINDINGS: Tip of the right chest port in the lower SVC. Low lung volumes. Minor bibasilar  atelectasis. Heart size grossly normal, not well assessed by low lung volumes. Mediastinal contours are normal. Subsegmental atelectasis or scarring in the left lower lung zone. No confluent airspace disease, pulmonary edema, large pleural effusion or pneumothorax. No acute osseous abnormalities are seen. IMPRESSION: Low lung volumes with bibasilar atelectasis. Electronically Signed   By: Keith Rake M.D.   On: 05/13/2019 15:03    EKG: Independently reviewed. Sinus tachycardia with prolong QT interval  Assessment/Plan Active Problems:   Syncope   Major depressive disorder, recurrent episode, moderate (HCC)   Orthostatic hypotension   Endometrial cancer (HCC)   Generalized anxiety disorder   Seizures ([place])  (please populate well all problems here in Problem List. (For example, if patient is on BP meds at home and you resume or decide to hold them, it is a problem that needs to be her. Same for CAD, COPD, HLD and so on)   1. Syncopal - most likely related to orthostatic hypotension. With chem her fluid intake is down. She also has chronic fluid loss from chronic diarrhea. She has received 1 L bolus. Plan Med-surg admit  IVF 75 cc/hr  MRI brain r/o mets  2D echo - new systolic mm.  2. Hypokalemia - a chronic problem for this patient related to chemo and chronic diarrhea. Received IV K and PO K in ED Plan Repeat K level at 2300 - with acute replacement if indicated  Continue oral K  Bmet in AM  3. Seizure disorder - continue home meds. No evidence of recurrent seizure at this time.   4. Depression - continue home meds.  5. Code status - patient is a full code. Discussed the implications in  detail and surggested patient and family visit [email or web address]  6. Disposition - home in 24-48 hours.    DVT prophylaxis: lovenox  Code Status: full code  Family Communication: Daughter present during exam. Agrees with tx plan  Disposition Plan: home 24-48 hrs  Consults called:  None  Admission status: observation    Adella Hare MD [facility] Pager [phone]  If 7PM-7AM, please contact night-coverage [email or web address] Password [facility]  05/13/2019, 8:40 PM
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
## 2019-05-13 NOTE — ED Triage Notes (Signed)
Active chemo patient. Reports having syncopal episode yesterday due to fatigue/weakness and reports hitting her head.

## 2019-05-14 ENCOUNTER — Observation Stay (HOSPITAL_COMMUNITY): Payer: Medicare Other

## 2019-05-14 ENCOUNTER — Encounter (HOSPITAL_COMMUNITY): Payer: Self-pay | Admitting: Internal Medicine

## 2019-05-14 DIAGNOSIS — C541 Malignant neoplasm of endometrium: Secondary | ICD-10-CM | POA: Diagnosis present

## 2019-05-14 DIAGNOSIS — K529 Noninfective gastroenteritis and colitis, unspecified: Secondary | ICD-10-CM | POA: Diagnosis present

## 2019-05-14 DIAGNOSIS — F411 Generalized anxiety disorder: Secondary | ICD-10-CM | POA: Diagnosis present

## 2019-05-14 DIAGNOSIS — R55 Syncope and collapse: Secondary | ICD-10-CM

## 2019-05-14 DIAGNOSIS — I951 Orthostatic hypotension: Secondary | ICD-10-CM | POA: Diagnosis present

## 2019-05-14 DIAGNOSIS — I2510 Atherosclerotic heart disease of native coronary artery without angina pectoris: Secondary | ICD-10-CM | POA: Diagnosis present

## 2019-05-14 DIAGNOSIS — F331 Major depressive disorder, recurrent, moderate: Secondary | ICD-10-CM | POA: Diagnosis present

## 2019-05-14 DIAGNOSIS — N179 Acute kidney failure, unspecified: Secondary | ICD-10-CM | POA: Diagnosis present

## 2019-05-14 DIAGNOSIS — R569 Unspecified convulsions: Secondary | ICD-10-CM

## 2019-05-14 DIAGNOSIS — R9431 Abnormal electrocardiogram [ECG] [EKG]: Secondary | ICD-10-CM

## 2019-05-14 DIAGNOSIS — G40909 Epilepsy, unspecified, not intractable, without status epilepticus: Secondary | ICD-10-CM | POA: Diagnosis present

## 2019-05-14 DIAGNOSIS — Z20822 Contact with and (suspected) exposure to covid-19: Secondary | ICD-10-CM | POA: Diagnosis present

## 2019-05-14 DIAGNOSIS — D696 Thrombocytopenia, unspecified: Secondary | ICD-10-CM | POA: Diagnosis present

## 2019-05-14 DIAGNOSIS — T451X5A Adverse effect of antineoplastic and immunosuppressive drugs, initial encounter: Secondary | ICD-10-CM | POA: Diagnosis present

## 2019-05-14 DIAGNOSIS — N182 Chronic kidney disease, stage 2 (mild): Secondary | ICD-10-CM | POA: Diagnosis present

## 2019-05-14 DIAGNOSIS — R296 Repeated falls: Secondary | ICD-10-CM | POA: Diagnosis present

## 2019-05-14 DIAGNOSIS — J449 Chronic obstructive pulmonary disease, unspecified: Secondary | ICD-10-CM | POA: Diagnosis present

## 2019-05-14 DIAGNOSIS — K219 Gastro-esophageal reflux disease without esophagitis: Secondary | ICD-10-CM | POA: Diagnosis present

## 2019-05-14 DIAGNOSIS — Z800 Family history of malignant neoplasm of digestive organs: Secondary | ICD-10-CM | POA: Diagnosis not present

## 2019-05-14 DIAGNOSIS — G4700 Insomnia, unspecified: Secondary | ICD-10-CM | POA: Diagnosis present

## 2019-05-14 DIAGNOSIS — E860 Dehydration: Secondary | ICD-10-CM | POA: Diagnosis present

## 2019-05-14 DIAGNOSIS — E785 Hyperlipidemia, unspecified: Secondary | ICD-10-CM | POA: Diagnosis present

## 2019-05-14 DIAGNOSIS — C7800 Secondary malignant neoplasm of unspecified lung: Secondary | ICD-10-CM | POA: Diagnosis present

## 2019-05-14 DIAGNOSIS — E876 Hypokalemia: Secondary | ICD-10-CM | POA: Diagnosis present

## 2019-05-14 DIAGNOSIS — M797 Fibromyalgia: Secondary | ICD-10-CM | POA: Diagnosis present

## 2019-05-14 DIAGNOSIS — M1990 Unspecified osteoarthritis, unspecified site: Secondary | ICD-10-CM | POA: Diagnosis present

## 2019-05-14 LAB — BASIC METABOLIC PANEL
Anion gap: 11 (ref 5–15)
BUN: 16 mg/dL (ref 8–23)
CO2: 26 mmol/L (ref 22–32)
Calcium: 8.7 mg/dL — ABNORMAL LOW (ref 8.9–10.3)
Chloride: 100 mmol/L (ref 98–111)
Creatinine, Ser: 0.92 mg/dL (ref 0.44–1.00)
GFR calc Af Amer: >60 mL/min (ref >60)
GFR calc non Af Amer: >60 mL/min (ref >60)
Glucose, Bld: 102 mg/dL — ABNORMAL HIGH (ref 70–99)
Potassium: 3.2 mmol/L — ABNORMAL LOW (ref 3.5–5.1)
Sodium: 137 mmol/L (ref 135–145)

## 2019-05-14 LAB — HIV ANTIBODY (ROUTINE TESTING W REFLEX): HIV Screen 4th Generation wRfx: NONREACTIVE

## 2019-05-14 LAB — C DIFFICILE QUICK SCREEN W PCR REFLEX
C Diff antigen: NEGATIVE
C Diff interpretation: NOT DETECTED
C Diff toxin: NEGATIVE

## 2019-05-14 LAB — CBG MONITORING, ED: Glucose-Capillary: 108 mg/dL — ABNORMAL HIGH (ref 70–99)

## 2019-05-14 LAB — ECHOCARDIOGRAM COMPLETE
Height: 59 in
Weight: 2663.16 oz

## 2019-05-14 LAB — POTASSIUM: Potassium: 3 mmol/L — ABNORMAL LOW (ref 3.5–5.1)

## 2019-05-14 MED ORDER — SODIUM CHLORIDE 0.9 % IV SOLN: INTRAVENOUS | Status: AC

## 2019-05-14 MED ORDER — POTASSIUM CHLORIDE CRYS ER 20 MEQ PO TBCR
40.0000 meq | EXTENDED_RELEASE_TABLET | Freq: Once | ORAL | Status: AC
  Administered 2019-05-14: 40 meq via ORAL
  Filled 2019-05-14: qty 2

## 2019-05-14 MED ORDER — ENSURE ENLIVE PO LIQD
237.0000 mL | Freq: Two times a day (BID) | ORAL | Status: DC
  Administered 2019-05-14: 237 mL via ORAL

## 2019-05-14 MED ORDER — MAGNESIUM SULFATE 2 GM/50ML IV SOLN
2.0000 g | Freq: Once | INTRAVENOUS | Status: AC
  Administered 2019-05-14: 2 g via INTRAVENOUS
  Filled 2019-05-14: qty 50

## 2019-05-14 NOTE — Evaluation (Addendum)
Physical Therapy Evaluation Patient Details Name: Rebekah [name] MRN: [id number] DOB: 01-08-1956 Today's Date: 05/14/2019   History of Present Illness  Cameisha Wiecek is a 64 y.o. female with medical history significant of active endometrial cancer with mets to the lungs per patient, on chemotherapy, chroinc diarrhea, MDD, seizures admitted with syncopal episode and 2 falls in 1 week (one in ED.)  Found to be dehydrated with hypokalemia.  Clinical Impression  Patient presents with decreased mobility due to weakness, decreased balance and decreased activity tolerance.  She will benefit from skilled PT in the acute setting to allow increased safety and independence upon d/c.  Will trial with cane prior to ordering for home.     Orthostatic VS for the past 24 hrs (Last 3 readings):  BP- Lying Pulse- Lying BP- Sitting Pulse- Sitting BP- Standing at 0 minutes Pulse- Standing at 0 minutes  05/14/19 1304 113/77 -- 97/79 -- (!) 109/97 --    Follow Up Recommendations Home health PT;Supervision - Intermittent    Equipment Recommendations  Cane    Recommendations for Other Services       Precautions / Restrictions Precautions Precautions: Fall Precaution Comments: watch BP      Mobility  Bed Mobility Overal bed mobility: Modified Independent                Transfers Overall transfer level: Needs assistance Equipment used: None Transfers: Sit to/from Stand Sit to Stand: Min guard         General transfer comment: for balance/safety with recent falls  Ambulation/Gait Ambulation/Gait assistance: Supervision;Min guard Gait Distance (Feet): 150 Feet Assistive device: None Gait Pattern/deviations: Step-through pattern;Decreased stride length;Wide base of support;Drifts right/left     General Gait Details: veers to sides at times, some instability noted  Stairs            Wheelchair Mobility    Modified Rankin (Stroke Patients Only)        Balance Overall balance assessment: Needs assistance Sitting-balance support: Feet unsupported Sitting balance-Leahy Scale: Good Sitting balance - Comments: able to don sock seated EOB   Standing balance support: No upper extremity supported Standing balance-Leahy Scale: Fair Standing balance comment: static balance good while obtaining standing BP, with gait needs minguard A for balance                             Pertinent Vitals/Pain Pain Assessment: No/denies pain    Home Living Family/patient expects to be discharged to:: Private residence Living Arrangements: Children(daughter) Available Help at Discharge: Family;Available PRN/intermittently Type of Home: House Home Access: Stairs to enter   Entrance Stairs-Number of Steps: 1 Home Layout: Two level;Able to live on main level with bedroom/bathroom Home Equipment: None      Prior Function Level of Independence: Independent         Comments: daughter works, pt helps take care of young grandchildren     Hand Dominance   Dominant Hand: Right    Extremity/Trunk Assessment   Upper Extremity Assessment Upper Extremity Assessment: Defer to OT evaluation    Lower Extremity Assessment Lower Extremity Assessment: Generalized weakness       Communication   Communication: No difficulties  Cognition Arousal/Alertness: Awake/alert Behavior During Therapy: WFL for tasks assessed/performed Overall Cognitive Status: Within Functional Limits for tasks assessed  General Comments General comments (skin integrity, edema, etc.): states daughter already has ordered shower seat    Exercises     Assessment/Plan    PT Assessment    PT Problem List         PT Treatment Interventions      PT Goals (Current goals can be found in the Care Plan section)  Acute Rehab PT Goals Patient Stated Goal: to return home PT Goal Formulation: With patient Time  For Goal Achievement: 05/28/19 Potential to Achieve Goals: Good    Frequency     Barriers to discharge        Co-evaluation PT/OT/SLP Co-Evaluation/Treatment: Yes Reason for Co-Treatment: To address functional/ADL transfers PT goals addressed during session: Mobility/safety with mobility;Balance         AM-PAC PT "6 Clicks" Mobility  Outcome Measure Help needed turning from your back to your side while in a flat bed without using bedrails?: None Help needed moving from lying on your back to sitting on the side of a flat bed without using bedrails?: None Help needed moving to and from a bed to a chair (including a wheelchair)?: A Little Help needed standing up from a chair using your arms (e.g., wheelchair or bedside chair)?: A Little Help needed to walk in hospital room?: A Little Help needed climbing 3-5 steps with a railing? : A Lot 6 Click Score: 19    End of Session   Activity Tolerance: Patient tolerated treatment well Patient left: in bed;with call bell/phone within reach   PT Visit Diagnosis: Other abnormalities of gait and mobility (R26.89);Muscle weakness (generalized) (M62.81)    Time: [id number] PT Time Calculation (min) (ACUTE ONLY): 29 min   Charges:   PT Evaluation $PT Eval Moderate Complexity: [place], [place] Acute Rehabilitation Services [phone] 05/14/2019   Reginia Naas 05/14/2019, 1:13 PM

## 2019-05-14 NOTE — Progress Notes (Signed)
NEW ADMISSION NOTE New Admission Note:   Arrival Method: Patient arrived in w/c from ED Mental Orientation: Alert and oriented x 4. Telemetry: 4M-07, ST to NSR Assessment: Completed Skin: Warm, dry and intact. Abrasion on the right hand and bruise on the right abdomen. IV:  Left FA NS @75  ml/hr Pain: Denies any pain. Tubes: N/A Safety Measures: Safety Fall Prevention Plan has been given, discussed and signed Admission: Completed [facility] [place] Orientation: Patient has been orientated to the room, unit and staff.  Family:  Orders have been reviewed and implemented. Will continue to monitor the patient. Call light has been placed within reach and bed alarm has been activated.   Amaryllis Dyke, RN

## 2019-05-14 NOTE — ED Notes (Signed)
Lunch Tray Ordered @ 1119. 

## 2019-05-14 NOTE — Progress Notes (Addendum)
PROGRESS NOTE  Rebekah [name] [id number] DOB: 09/25/1955 DOA: 05/13/2019 PCP: Rudene Anda, MD  Brief summary:  medical history significant of active endometrial cancer with mets to the lungs per patient, on chemotherapy, MDD, seizures on Lamictal and Keppra presents to the ER for syncopal episode that occurred Tuesday night..  She states she was going to the bathroom and then she felt woozy and passed after calling out her daughter's name. Her daughter found her on the floor up against the bathtube face down. The daughter rolled her over and the patient seemed to regain conciousness with a minute or two.   1 in the ER as she was transferring to her bed in the room due to loss of balance per patient  HPI/Recap of past 24 hours:  Remain weak, feeling nauseous, does not want to eat Had 2 large watery bowel movement this morning  Assessment/Plan: Active Problems:   Major depressive disorder, recurrent episode, moderate (HCC)   Orthostatic hypotension   Endometrial cancer (HCC)   Generalized anxiety disorder   Seizures ([place])   Syncope   Syncope and collapse  Recurrent syncope, denies seizure activities -CT head negative -High-sensitivity troponin unremarkable -Echocardiogram LVEF 44%, grade 1 diastolic dysfunction -Has Ortho static hypotension in the ED, she appears dehydrated on physical exam, will continue hydration -We will repeat orthostatic in the morning -Keep on telemetry  Hypokalemia K2.6 on presentation, remain low continue supplement  Hypomagnesemia Replace mag IV  AKI on CKD 2 -BUN 22 creatinine 1.40 -UA small leukocyte, rare bacteria, urine culture no significant growth, she denies urinary symptom -AKI likely due to dehydration, improved after hydration  QTC prolongation QTC 518 on presentation We will keep on telemetry Keep K above 4, mag above 2  Diarrhea; report watery Will check C. difficile and GI PCR panel  seizures on Lamictal and  Keppra , denies seizure activities  endometrial cancer with mets to the lungs per patient Sees Dr. Claiborne Billings for her cancer through [facility]. Patient states she received chemotherapy last Wednesday  DVT Prophylaxis: Subcu Lovenox  Code Status: Full  Family Communication: patient   Disposition Plan:    Patient came from:        Home                                                                                                   Anticipated d/c place:  Home  Barriers to d/c OR conditions which need to be met to effect a safe d/c:  Electrolyte abnormality, poor appetite require IV hydration, likely discharge in 24 to 48 hours   Consultants:  None  Procedures:  None  Antibiotics:  None   Objective: BP 121/75 (BP Location: Right Arm)   Pulse (!) 101   Temp 98.6 F (37 C) (Oral)   Resp 17   Ht 5' (1.524 m)   Wt 75.6 kg   SpO2 96%   BMI 32.56 kg/m   Intake/Output Summary (Last 24 hours) at 05/14/2019 1333 Last data filed at 05/13/2019 1856 Gross per 24 hour  Intake 2096.67 ml  Output --  Net 2096.67 ml   Filed Weights   05/13/19 1409 05/14/19 1250  Weight: 75.5 kg 75.6 kg    Exam: Patient is examined daily including today on 05/14/2019, exams remain the same as of yesterday except that has changed    General: Pale , alopecia ,NAD  Cardiovascular: RRR  Respiratory: CTABL  Abdomen: Soft/ND/NT, positive BS  Musculoskeletal: No Edema  Neuro: alert, oriented x3  Data Reviewed: Basic Metabolic Panel: Recent Labs  Lab 05/13/19 1135 05/13/19 1716 05/13/19 2300 05/14/19 0506  NA 133*  --   --  137  K 2.6*  --  3.0* 3.2*  CL 90*  --   --  100  CO2 28  --   --  26  GLUCOSE 140*  --   --  102*  BUN 22  --   --  16  CREATININE 1.40*  --   --  0.92  CALCIUM 9.5  --   --  8.7*  MG  --  1.8  --   --    Liver Function Tests: No results for input(s): AST, ALT, ALKPHOS, BILITOT, PROT, ALBUMIN in the last 168 hours. No results  for input(s): LIPASE, AMYLASE in the last 168 hours. No results for input(s): AMMONIA in the last 168 hours. CBC: Recent Labs  Lab 05/13/19 1135  WBC 3.7*  HGB 13.1  HCT 38.3  MCV 97.0  PLT 152   Cardiac Enzymes:   Recent Labs  Lab 05/13/19 1716  CKTOTAL 137   BNP (last 3 results) No results for input(s): BNP in the last 8760 hours.  ProBNP (last 3 results) No results for input(s): PROBNP in the last 8760 hours.  CBG: Recent Labs  Lab 05/13/19 1503 05/14/19 0711  GLUCAP 120* 108*    Recent Results (from the past 240 hour(s))  Respiratory Panel by RT PCR (Flu A&B, Covid) - Nasopharyngeal Swab     Status: None   Collection Time: 05/13/19  7:37 PM   Specimen: Nasopharyngeal Swab  Result Value Ref Range Status   SARS Coronavirus 2 by RT PCR NEGATIVE NEGATIVE Final    Comment: (NOTE) SARS-CoV-2 target nucleic acids are NOT DETECTED. The SARS-CoV-2 RNA is generally detectable in upper respiratoy specimens during the acute phase of infection. The lowest concentration of SARS-CoV-2 viral copies this assay can detect is 131 copies/mL. A negative result does not preclude SARS-Cov-2 infection and should not be used as the sole basis for treatment or other patient management decisions. A negative result may occur with  improper specimen collection/handling, submission of specimen other than nasopharyngeal swab, presence of viral mutation(s) within the areas targeted by this assay, and inadequate number of viral copies (<131 copies/mL). A negative result must be combined with clinical observations, patient history, and epidemiological information. The expected result is Negative. Fact Sheet for Patients:  PinkCheek.be Fact Sheet for Healthcare Providers:  GravelBags.it This test is not yet ap proved or cleared by the [place] FDA and  has been authorized for detection and/or diagnosis of SARS-CoV-2 by FDA under  an Emergency Use Authorization (EUA). This EUA will remain  in effect (meaning this test can be used) for the duration of the COVID-19 declaration under Section 564(b)(1) of the Act, 21 U.S.C. section 360bbb-3(b)(1), unless the authorization is terminated or revoked sooner.    Influenza A by PCR NEGATIVE NEGATIVE Final   Influenza B by PCR NEGATIVE NEGATIVE Final    Comment: (NOTE) The Xpert Xpress SARS-CoV-2/FLU/RSV assay is intended  as an aid in  the diagnosis of influenza from Nasopharyngeal swab specimens and  should not be used as a sole basis for treatment. Nasal washings and  aspirates are unacceptable for Xpert Xpress SARS-CoV-2/FLU/RSV  testing. Fact Sheet for Patients: PinkCheek.be Fact Sheet for Healthcare Providers: GravelBags.it This test is not yet approved or cleared by the [place] FDA and  has been authorized for detection and/or diagnosis of SARS-CoV-2 by  FDA under an Emergency Use Authorization (EUA). This EUA will remain  in effect (meaning this test can be used) for the duration of the  Covid-19 declaration under Section 564(b)(1) of the Act, 21  U.S.C. section 360bbb-3(b)(1), unless the authorization is  terminated or revoked. Performed at [facility], [place] [address] [place], [place] [address]      Studies: CT Head Wo Contrast  Result Date: 05/13/2019 CLINICAL DATA:  Head trauma, headache. Additional history provided: Recent falls and head trauma, patient with history of lung cancer, chemotherapy EXAM: CT HEAD WITHOUT CONTRAST TECHNIQUE: Contiguous axial images were obtained from the base of the skull through the vertex without intravenous contrast. COMPARISON:  No pertinent prior studies available for comparison. FINDINGS: Brain: Please note there is limited assessment for intracranial metastatic disease on this non-contrast head CT. There is no acute intracranial hemorrhage. No demarcated  cortical infarct. No extra-axial fluid collection. No evidence of intracranial mass. No midline shift. Partially empty sella turcica. Vascular: No hyperdense vessel.  Atherosclerotic calcifications Skull: Negative for fracture. Multiple arachnoid pits within the floor of the middle cranial fossa. Sinuses/Orbits: Visualized orbits show no acute finding. Mild ethmoid and maxillary sinus mucosal thickening at the imaged levels. No significant mastoid effusion. IMPRESSION: 1. Please note assessment for intracranial metastatic disease is limited on this non-contrast head CT. 2. No evidence of acute intracranial hemorrhage. 3. Partially empty sella turcica with multiple middle cranial fossa arachnoid pits. These findings may be seen in the setting of idiopathic intracranial hypertension (pseudotumor cerebri). 4. Mild paranasal sinus mucosal thickening. Electronically Signed   By: Kellie Simmering DO   On: 05/13/2019 17:40   MR BRAIN WO CONTRAST  Result Date: 05/13/2019 CLINICAL DATA:  Initial evaluation for acute syncope. EXAM: MRI HEAD WITHOUT CONTRAST TECHNIQUE: Multiplanar, multiecho pulse sequences of the brain and surrounding structures were obtained without intravenous contrast. COMPARISON:  Prior head CT from 05/13/2019. FINDINGS: Brain: Cerebral volume within normal limits for age. No significant cerebral white matter disease. There is a punctate focus of apparent diffusion abnormality seen involving the subcortical white matter of the left parietal lobe (series 2, image 24). This is not seen on corresponding sequences, and there is no associated ADC correlate. Finding favored to be artifactual in nature. No other foci of restricted diffusion to suggest acute or subacute ischemia. Gray-white matter differentiation maintained. No encephalomalacia to suggest chronic cortical infarction. No foci of susceptibility artifact to suggest acute or chronic intracranial hemorrhage. No mass lesion, midline shift or mass  effect. No hydrocephalus. Probable 11 mm arachnoid cyst noted at the right cavernous sinus (series 5, image 7). Mild mass effect on the adjacent cavernous right ICA. No other extra-axial fluid collection. Incidental note made of an empty sella. Midline structures intact. Vascular: Major intracranial vascular flow voids well maintained. Skull and upper cervical spine: Craniocervical junction normal. Upper cervical spine within normal limits. Bone marrow signal intensity normal. No scalp soft tissue abnormality. Sinuses/Orbits: Globes and orbital soft tissues within normal limits. Mild mucosal thickening noted within the ethmoidal air cells  and maxillary sinuses. Paranasal sinuses are otherwise clear. No mastoid effusion. Inner ear structures grossly normal. Other: None. IMPRESSION: 1. No acute intracranial abnormality. 2. Empty sella. While this finding is often incidental in nature and of no clinical significance, this can also be seen in the setting of idiopathic intracranial hypertension. 3. Probable 11 mm benign arachnoid cyst at the right cavernous sinus. Electronically Signed   By: Jeannine Boga M.D.   On: 05/13/2019 23:39   DG Chest Portable 1 View  Result Date: 05/13/2019 CLINICAL DATA:  Syncope. Active chemotherapy. Fatigue and weakness. EXAM: PORTABLE CHEST 1 VIEW COMPARISON:  None. FINDINGS: Tip of the right chest port in the lower SVC. Low lung volumes. Minor bibasilar atelectasis. Heart size grossly normal, not well assessed by low lung volumes. Mediastinal contours are normal. Subsegmental atelectasis or scarring in the left lower lung zone. No confluent airspace disease, pulmonary edema, large pleural effusion or pneumothorax. No acute osseous abnormalities are seen. IMPRESSION: Low lung volumes with bibasilar atelectasis. Electronically Signed   By: Keith Rake M.D.   On: 05/13/2019 15:03    Scheduled Meds: . ARIPiprazole  2 mg Oral QHS  . docusate sodium  100 mg Oral BID  .  DULoxetine  60 mg Oral BID  . enoxaparin (LOVENOX) injection  40 mg Subcutaneous QHS  . famotidine  20 mg Oral BID  . feeding supplement (ENSURE ENLIVE)  237 mL Oral BID BM  . folic acid  1 mg Oral Daily  . lamoTRIgine  150 mg Oral q morning - 10a   And  . lamoTRIgine  100 mg Oral QHS  . levETIRAcetam  1,500 mg Oral BID  . potassium chloride  20 mEq Oral Daily  . sodium chloride flush  3 mL Intravenous Q12H    Continuous Infusions: . sodium chloride    . sodium chloride       Time spent: 46mns I have personally reviewed and interpreted on  05/14/2019 daily labs, tele strips, imagings as discussed above under date review session and assessment and plans.  I reviewed all nursing notes, pharmacy notes,   vitals, pertinent old records  I have discussed plan of care as described above with RN , patient  on 05/14/2019   FFlorencia ReasonsMD, PhD, FACP  [facility]  Available via Epic secure chat 7am-7pm for nonurgent issues Please page for urgent issues, pager number available through [email or web address] .   05/14/2019, 1:33 PM  LOS: 1 day

## 2019-05-14 NOTE — Evaluation (Signed)
Occupational Therapy Evaluation and Discharge Patient Details Name: Rebekah [name] MRN: [id number] DOB: Apr 02, 1955 Today's Date: 05/14/2019    History of Present Illness Rebekah [name] is a 64 y.o. female with medical history significant of active endometrial cancer with mets to the lungs per patient, on chemotherapy, chroinc diarrhea, MDD, seizures admitted with syncopal episode and 2 falls in 1 week (one in ED.)  Found to be dehydrated with hypokalemia.   Clinical Impression   Pt is functioning at a supervision to min guard assist level in ADL and mobility. Pt plans to get a shower seat and is requesting a cane for home. Pt asymptomatic for syncope this visit. No further OT needs.    Follow Up Recommendations  No OT follow up    Equipment Recommendations  None recommended by OT    Recommendations for Other Services       Precautions / Restrictions Precautions Precautions: Fall Precaution Comments: watch BP      Mobility Bed Mobility Overal bed mobility: Modified Independent                Transfers Overall transfer level: Needs assistance Equipment used: None Transfers: Sit to/from Stand Sit to Stand: Min guard         General transfer comment: for balance/safety with recent falls    Balance Overall balance assessment: Needs assistance Sitting-balance support: Feet unsupported Sitting balance-Leahy Scale: Good Sitting balance - Comments: able to don sock seated EOB   Standing balance support: No upper extremity supported Standing balance-Leahy Scale: Fair Standing balance comment: static balance good while obtaining standing BP, with gait needs minguard A for balance                           ADL either performed or assessed with clinical judgement   ADL                                         General ADL Comments: Overall functioning at a supervision level for safety.     Vision Baseline Vision/History:  Wears glasses Wears Glasses: At all times Patient Visual Report: No change from baseline       Perception     Praxis      Pertinent Vitals/Pain Pain Assessment: No/denies pain     Hand Dominance Right   Extremity/Trunk Assessment Upper Extremity Assessment Upper Extremity Assessment: Overall WFL for tasks assessed   Lower Extremity Assessment Lower Extremity Assessment: Defer to PT evaluation   Cervical / Trunk Assessment Cervical / Trunk Assessment: Kyphotic   Communication Communication Communication: No difficulties   Cognition Arousal/Alertness: Awake/alert Behavior During Therapy: WFL for tasks assessed/performed Overall Cognitive Status: Within Functional Limits for tasks assessed                                     General Comments  states daughter already has ordered shower seat    Exercises     Shoulder Instructions      Home Living Family/patient expects to be discharged to:: Private residence Living Arrangements: Children(daughter and young grandchildren) Available Help at Discharge: Family;Available PRN/intermittently Type of Home: House Home Access: Stairs to enter Entrance Stairs-Number of Steps: 1   Home Layout: Two level;Able to live on main level with bedroom/bathroom  Bathroom Shower/Tub: Occupational psychologist: None          Prior Functioning/Environment Level of Independence: Independent        Comments: daughter works, pt helps take care of young grandchildren        OT Problem List:        OT Treatment/Interventions:      OT Goals(Current goals can be found in the care plan section) Acute Rehab OT Goals Patient Stated Goal: to return home  OT Frequency:     Barriers to D/C:            Co-evaluation   Reason for Co-Treatment: To address functional/ADL transfers PT goals addressed during session: Mobility/safety with mobility;Balance        AM-PAC  OT "6 Clicks" Daily Activity     Outcome Measure Help from another person eating meals?: None Help from another person taking care of personal grooming?: A Little Help from another person toileting, which includes using toliet, bedpan, or urinal?: A Little Help from another person bathing (including washing, rinsing, drying)?: A Little Help from another person to put on and taking off regular upper body clothing?: None Help from another person to put on and taking off regular lower body clothing?: A Little 6 Click Score: 20   End of Session    Activity Tolerance: Patient tolerated treatment well Patient left: in bed;with call bell/phone within reach  OT Visit Diagnosis: Unsteadiness on feet (R26.81)                Time: [id number] OT Time Calculation (min): 17 min Charges:  OT General Charges $OT Visit: 1 Visit OT Evaluation $OT Eval Low Complexity: 1 Low  Rebekah [name], OTR/L Acute Rehabilitation Services Pager: [phone] Office: [phone]  Rebekah [name] 05/14/2019, 1:22 PM

## 2019-05-14 NOTE — ED Notes (Signed)
Attempted to call report

## 2019-05-14 NOTE — ED Notes (Signed)
Breakfast ordered 

## 2019-05-14 NOTE — Progress Notes (Signed)
  Echocardiogram 2D Echocardiogram has been performed.  Rebekah [name] 05/14/2019, 11:32 AM

## 2019-05-15 LAB — CBC
HCT: 29.2 % — ABNORMAL LOW (ref 36.0–46.0)
Hemoglobin: 9.6 g/dL — ABNORMAL LOW (ref 12.0–15.0)
MCH: 32.8 pg (ref 26.0–34.0)
MCHC: 32.9 g/dL (ref 30.0–36.0)
MCV: 99.7 fL (ref 80.0–100.0)
Platelets: 127 10*3/uL — ABNORMAL LOW (ref 150–400)
RBC: 2.93 MIL/uL — ABNORMAL LOW (ref 3.87–5.11)
RDW: 16.2 % — ABNORMAL HIGH (ref 11.5–15.5)
WBC: 16.6 10*3/uL — ABNORMAL HIGH (ref 4.0–10.5)
nRBC: 1.2 % — ABNORMAL HIGH (ref 0.0–0.2)

## 2019-05-15 LAB — BASIC METABOLIC PANEL
Anion gap: 9 (ref 5–15)
BUN: 7 mg/dL — ABNORMAL LOW (ref 8–23)
CO2: 27 mmol/L (ref 22–32)
Calcium: 8.2 mg/dL — ABNORMAL LOW (ref 8.9–10.3)
Chloride: 100 mmol/L (ref 98–111)
Creatinine, Ser: 0.74 mg/dL (ref 0.44–1.00)
GFR calc Af Amer: >60 mL/min (ref >60)
GFR calc non Af Amer: >60 mL/min (ref >60)
Glucose, Bld: 106 mg/dL — ABNORMAL HIGH (ref 70–99)
Potassium: 2.5 mmol/L — CL (ref 3.5–5.1)
Sodium: 136 mmol/L (ref 135–145)

## 2019-05-15 LAB — HEPATIC FUNCTION PANEL
ALT: 47 U/L — ABNORMAL HIGH (ref 0–44)
AST: 29 U/L (ref 15–41)
Albumin: 3 g/dL — ABNORMAL LOW (ref 3.5–5.0)
Alkaline Phosphatase: 110 U/L (ref 38–126)
Bilirubin, Direct: <0.1 mg/dL (ref 0.0–0.2)
Total Bilirubin: 0.5 mg/dL (ref 0.3–1.2)
Total Protein: 5.9 g/dL — ABNORMAL LOW (ref 6.5–8.1)

## 2019-05-15 LAB — CBC WITH DIFFERENTIAL/PLATELET
Abs Immature Granulocytes: 1.67 10*3/uL — ABNORMAL HIGH (ref 0.00–0.07)
Basophils Absolute: 0 10*3/uL (ref 0.0–0.1)
Basophils Relative: 0 %
Eosinophils Absolute: 0 10*3/uL (ref 0.0–0.5)
Eosinophils Relative: 0 %
HCT: 27.2 % — ABNORMAL LOW (ref 36.0–46.0)
Hemoglobin: 9.2 g/dL — ABNORMAL LOW (ref 12.0–15.0)
Immature Granulocytes: 11 %
Lymphocytes Relative: 16 %
Lymphs Abs: 2.4 10*3/uL (ref 0.7–4.0)
MCH: 34.2 pg — ABNORMAL HIGH (ref 26.0–34.0)
MCHC: 33.8 g/dL (ref 30.0–36.0)
MCV: 101.1 fL — ABNORMAL HIGH (ref 80.0–100.0)
Monocytes Absolute: 2 10*3/uL — ABNORMAL HIGH (ref 0.1–1.0)
Monocytes Relative: 14 %
Neutro Abs: 8.8 10*3/uL — ABNORMAL HIGH (ref 1.7–7.7)
Neutrophils Relative %: 59 %
Platelets: 123 10*3/uL — ABNORMAL LOW (ref 150–400)
RBC: 2.69 MIL/uL — ABNORMAL LOW (ref 3.87–5.11)
RDW: 16.2 % — ABNORMAL HIGH (ref 11.5–15.5)
WBC: 14.9 10*3/uL — ABNORMAL HIGH (ref 4.0–10.5)
nRBC: 1.2 % — ABNORMAL HIGH (ref 0.0–0.2)

## 2019-05-15 LAB — VITAMIN B12: Vitamin B-12: 5778 pg/mL — ABNORMAL HIGH (ref 180–914)

## 2019-05-15 LAB — OCCULT BLOOD X 1 CARD TO LAB, STOOL: Fecal Occult Bld: NEGATIVE

## 2019-05-15 LAB — RETICULOCYTES
Immature Retic Fract: 52.6 % — ABNORMAL HIGH (ref 2.3–15.9)
RBC.: 2.91 MIL/uL — ABNORMAL LOW (ref 3.87–5.11)
Retic Count, Absolute: 35.5 10*3/uL (ref 19.0–186.0)
Retic Ct Pct: 1.2 % (ref 0.4–3.1)

## 2019-05-15 LAB — IRON AND TIBC
Iron: 54 ug/dL (ref 28–170)
Saturation Ratios: 17 % (ref 10.4–31.8)
TIBC: 319 ug/dL (ref 250–450)
UIBC: 265 ug/dL

## 2019-05-15 LAB — GLUCOSE, CAPILLARY: Glucose-Capillary: 97 mg/dL (ref 70–99)

## 2019-05-15 LAB — PROTIME-INR
INR: 1.1 (ref 0.8–1.2)
Prothrombin Time: 13.5 seconds (ref 11.4–15.2)

## 2019-05-15 LAB — FOLATE: Folate: 27.2 ng/mL (ref >5.9)

## 2019-05-15 LAB — MAGNESIUM: Magnesium: 1.8 mg/dL (ref 1.7–2.4)

## 2019-05-15 MED ORDER — ENSURE ENLIVE PO LIQD
237.0000 mL | Freq: Three times a day (TID) | ORAL | Status: DC
  Administered 2019-05-15 – 2019-05-17 (×6): 237 mL via ORAL

## 2019-05-15 MED ORDER — ADULT MULTIVITAMIN W/MINERALS CH
1.0000 | ORAL_TABLET | Freq: Every day | ORAL | Status: DC
  Administered 2019-05-15 – 2019-05-17 (×3): 1 via ORAL
  Filled 2019-05-15 (×3): qty 1

## 2019-05-15 MED ORDER — MAGNESIUM SULFATE 2 GM/50ML IV SOLN
2.0000 g | Freq: Once | INTRAVENOUS | Status: AC
  Administered 2019-05-15: 2 g via INTRAVENOUS
  Filled 2019-05-15: qty 50

## 2019-05-15 MED ORDER — POTASSIUM CHLORIDE CRYS ER 20 MEQ PO TBCR
40.0000 meq | EXTENDED_RELEASE_TABLET | ORAL | Status: AC
  Administered 2019-05-15 (×3): 40 meq via ORAL
  Filled 2019-05-15 (×3): qty 2

## 2019-05-15 MED ORDER — SODIUM CHLORIDE 0.9 % IV SOLN: INTRAVENOUS | Status: AC

## 2019-05-15 MED ORDER — LOPERAMIDE HCL 2 MG PO CAPS
2.0000 mg | ORAL_CAPSULE | Freq: Four times a day (QID) | ORAL | Status: DC | PRN
  Administered 2019-05-16 – 2019-05-17 (×4): 2 mg via ORAL
  Filled 2019-05-15 (×4): qty 1

## 2019-05-15 NOTE — Progress Notes (Signed)
Initial Nutrition Assessment  **RD working remotely**  DOCUMENTATION CODES:   Obesity unspecified  INTERVENTION:  Ensure Enlive po TID, each supplement provides 350 kcal and 20 grams of protein  MVI daily  Magic cup TID with meals, each supplement provides 290 kcal and 9 grams of protein  NUTRITION DIAGNOSIS:   Increased nutrient needs related to chronic illness(cancer on chemotherapy) as evidenced by estimated needs.   GOAL:   Patient will meet greater than or equal to 90% of their needs   MONITOR:   PO intake, Supplement acceptance, Weight trends, Labs, I & O's  REASON FOR ASSESSMENT:   Malnutrition Screening Tool    ASSESSMENT:   Pt with a PMH significant for endometrial cancer with mets to the lungs, pt on chemotherapy; MDD; and seizures. Pt presented with syncope  RD unable to reach pt via phone. Discussed pt with RN.   Reviewed pt's wt history; no significant wt changes noted.   PO Intake: 0-100% x 5 recorded meals (20% average meal intake)  Medications reviewed and include: Pepcid, Ensure Enlive BID, Folvite, Klor-Con  Labs reviewed: K+ 2.5 (L)  No UOP documented. I/O: +3,769.58ml since admit  NUTRITION - FOCUSED PHYSICAL EXAM:  RD unable to perform at this time; working remotely.   Diet Order:   Diet Order            Diet general        Diet regular Room service appropriate? Yes; Fluid consistency: Thin  Diet effective now              EDUCATION NEEDS:   Not appropriate for education at this time  Skin:  Skin Assessment: Reviewed RN Assessment  Last BM:  4/30 type 7  Height:   Ht Readings from Last 1 Encounters:  05/14/19 5' (1.524 m)    Weight:   Wt Readings from Last 4 Encounters:  05/14/19 75.8 kg  03/13/18 75.5 kg  02/20/18 75.8 kg  01/03/18 75.6 kg    BMI:  Body mass index is 32.61 kg/m.  Estimated Nutritional Needs:   Kcal:  1800-2000  Protein:  90-105 grams  Fluid:  >/= 1.8 L   Larkin Ina, MS, RD,  LDN RD pager number and weekend/on-call pager number located in [place].

## 2019-05-15 NOTE — Progress Notes (Signed)
Physical Therapy Treatment Patient Details Name: Rebekah [name] MRN: [id number] DOB: 1955-06-23 Today's Date: 05/15/2019    History of Present Illness Rebekah [name] is a 64 y.o. female with medical history significant of active endometrial cancer with mets to the lungs per patient, on chemotherapy, chroinc diarrhea, MDD, seizures admitted with syncopal episode and 2 falls in 1 week (one in ED.)  Found to be dehydrated with hypokalemia.    PT Comments    Pt demonstrating slight improvement in stability with cane but reports she would prefer not use one at d/c if possible.  She ambulated 150' with supervision and cane.  Cont POC.   Follow Up Recommendations  Home health PT;Supervision - Intermittent     Equipment Recommendations  Cane(if pt agreeable)    Recommendations for Other Services       Precautions / Restrictions Precautions Precautions: Fall Restrictions Weight Bearing Restrictions: No    Mobility  Bed Mobility Overal bed mobility: Independent                Transfers Overall transfer level: Needs assistance Equipment used: None Transfers: Sit to/from Stand Sit to Stand: Supervision            Ambulation/Gait Ambulation/Gait assistance: Supervision Gait Distance (Feet): 150 Feet Assistive device: Straight cane Gait Pattern/deviations: Step-through pattern;Drifts right/left Gait velocity: decreased   General Gait Details: no LOB or instability; cued for use of cane   Stairs             Wheelchair Mobility    Modified Rankin (Stroke Patients Only)       Balance Overall balance assessment: Needs assistance   Sitting balance-Leahy Scale: Good     Standing balance support: No upper extremity supported Standing balance-Leahy Scale: Good Standing balance comment: able to stand without UE support on unlevel surface (fall precaution mat)                            Cognition Arousal/Alertness:  Awake/alert Behavior During Therapy: WFL for tasks assessed/performed Overall Cognitive Status: Within Functional Limits for tasks assessed                                        Exercises      General Comments General comments (skin integrity, edema, etc.): Reports prefers to not use AD if possible; reports falls due to getting up to quick/syncope      Pertinent Vitals/Pain Pain Assessment: No/denies pain    Home Living                      Prior Function            PT Goals (current goals can now be found in the care plan section) Progress towards PT goals: Progressing toward goals    Frequency    Min 3X/week      PT Plan Current plan remains appropriate    Co-evaluation              AM-PAC PT "6 Clicks" Mobility   Outcome Measure  Help needed turning from your back to your side while in a flat bed without using bedrails?: None Help needed moving from lying on your back to sitting on the side of a flat bed without using bedrails?: None Help needed moving to and from a bed to a chair (including  a wheelchair)?: None Help needed standing up from a chair using your arms (e.g., wheelchair or bedside chair)?: None Help needed to walk in hospital room?: None Help needed climbing 3-5 steps with a railing? : A Little 6 Click Score: 23    End of Session Equipment Utilized During Treatment: Gait belt Activity Tolerance: Patient tolerated treatment well Patient left: in bed;with call bell/phone within reach Nurse Communication: Mobility status PT Visit Diagnosis: Other abnormalities of gait and mobility (R26.89);Muscle weakness (generalized) (M62.81)     Time: [id number] PT Time Calculation (min) (ACUTE ONLY): 13 min  Charges:  $Gait Training: 8-22 mins                     Maggie Font, PT Acute Rehab Services Pager [phone] [facility] [phone]    Karlton Lemon 05/15/2019, 1:32  PM

## 2019-05-15 NOTE — Progress Notes (Signed)
PROGRESS NOTE  Rebekah [name] HFW:[id number] DOB: [birth year] DOA: 05/13/2019 PCP: Rudene Anda, MD  Brief summary:  medical history significant of active endometrial cancer with mets to the lungs per patient, on chemotherapy, MDD, seizures on Lamictal and Keppra presents to the ER for syncopal episode that occurred Tuesday night..  She states she was going to the bathroom and then she felt woozy and passed after calling out her daughter's name. Her daughter found her on the floor up against the bathtube face down. The daughter rolled her over and the patient seemed to regain conciousness with a minute or two.   1 in the ER as she was transferring to her bed in the room due to loss of balance per patient  HPI/Recap of past 24 hours:  Remain weak,  does not want to eat Had 4 watery bowel movement this am , no fever , denies ab pain  Assessment/Plan: Active Problems:   Major depressive disorder, recurrent episode, moderate (HCC)   Orthostatic hypotension   Endometrial cancer (HCC)   Generalized anxiety disorder   Seizures (HCC)   Syncope   Syncope and collapse  Recurrent syncope, denies seizure activities -CT head negative -High-sensitivity troponin unremarkable -Echocardiogram LVEF 02%, grade 1 diastolic dysfunction -Has Ortho static hypotension in the ED, she appears dehydrated on physical exam, will continue hydration - repeat orthostatic is improving -Keep on telemetry  Acute anemia/thrombocytopenia -No sign of bleeding, repeat lab at noon -Collect stool for FOBT, check B12/ folate/ iron panel/ reticulocyte count /total bilirubin -Suspect bone marrow suppression from chemo, monitor, transfuse as needed  Hypokalemia K2.6 on presentation, ,remain low continue supplement  Hypomagnesemia Continue Replace mag IV  AKI on CKD 2 -BUN 22 creatinine 1.40 -UA small leukocyte, rare bacteria, urine culture no significant growth, she denies urinary symptom -AKI likely due  to dehydration, improved after hydration  QTC prolongation QTC 518 on presentation keep on telemetry Keep K above 4, mag above 2  Diarrhea; report watery C. difficile negativeand GI PCR panel in process  seizures on Lamictal and Keppra , denies seizure activities  endometrial cancer with mets to the lungs per patient Sees Dr. Claiborne Billings for her cancer through [facility]. Patient states she received chemotherapy last Wednesday  DVT Prophylaxis: Subcu Lovenox  Code Status: Full  Family Communication: patient   Disposition Plan:    Patient came from:        Home                                                                                                   Anticipated d/c place:  Home  Barriers to d/c OR conditions which need to be met to effect a safe d/c:  Electrolyte abnormality, poor appetite require IV hydration, likely discharge in 24 to 48 hours   Consultants:  None  Procedures:  None  Antibiotics:  None   Objective: BP 106/74 (BP Location: Left Arm)   Pulse 97   Temp 98.5 F (36.9 C) (Oral)   Resp 16   Ht 5' (1.524 m)   Wt 75.8  kg   SpO2 94%   BMI 32.61 kg/m   Intake/Output Summary (Last 24 hours) at 05/15/2019 0840 Last data filed at 05/15/2019 0700 Gross per 24 hour  Intake 1673.07 ml  Output 0 ml  Net 1673.07 ml   Filed Weights   05/13/19 1409 05/14/19 1250 05/14/19 1941  Weight: 75.5 kg 75.6 kg 75.8 kg    Exam: Patient is examined daily including today on 05/15/2019, exams remain the same as of yesterday except that has changed    General: Pale , alopecia ,NAD  Cardiovascular: RRR  Respiratory: CTABL  Abdomen: Soft/ND/NT, positive BS  Musculoskeletal: No Edema  Neuro: alert, oriented x3  Data Reviewed: Basic Metabolic Panel: Recent Labs  Lab 05/13/19 1135 05/13/19 1716 05/13/19 2300 05/14/19 0506 05/15/19 0629  NA 133*  --   --  137 136  K 2.6*  --  3.0* 3.2* 2.5*  CL 90*  --   --  100 100  CO2  28  --   --  26 27  GLUCOSE 140*  --   --  102* 106*  BUN 22  --   --  16 7*  CREATININE 1.40*  --   --  0.92 0.74  CALCIUM 9.5  --   --  8.7* 8.2*  MG  --  1.8  --   --  1.8   Liver Function Tests: No results for input(s): AST, ALT, ALKPHOS, BILITOT, PROT, ALBUMIN in the last 168 hours. No results for input(s): LIPASE, AMYLASE in the last 168 hours. No results for input(s): AMMONIA in the last 168 hours. CBC: Recent Labs  Lab 05/13/19 1135 05/15/19 0629  WBC 3.7* 14.9*  NEUTROABS  --  PENDING  HGB 13.1 9.2*  HCT 38.3 27.2*  MCV 97.0 101.1*  PLT 152 123*   Cardiac Enzymes:   Recent Labs  Lab 05/13/19 1716  CKTOTAL 137   BNP (last 3 results) No results for input(s): BNP in the last 8760 hours.  ProBNP (last 3 results) No results for input(s): PROBNP in the last 8760 hours.  CBG: Recent Labs  Lab 05/13/19 1503 05/14/19 0711 05/15/19 0645  GLUCAP 120* 108* 97    Recent Results (from the past 240 hour(s))  Respiratory Panel by RT PCR (Flu A&B, Covid) - Nasopharyngeal Swab     Status: None   Collection Time: 05/13/19  7:37 PM   Specimen: Nasopharyngeal Swab  Result Value Ref Range Status   SARS Coronavirus 2 by RT PCR NEGATIVE NEGATIVE Final    Comment: (NOTE) SARS-CoV-2 target nucleic acids are NOT DETECTED. The SARS-CoV-2 RNA is generally detectable in upper respiratoy specimens during the acute phase of infection. The lowest concentration of SARS-CoV-2 viral copies this assay can detect is 131 copies/mL. A negative result does not preclude SARS-Cov-2 infection and should not be used as the sole basis for treatment or other patient management decisions. A negative result may occur with  improper specimen collection/handling, submission of specimen other than nasopharyngeal swab, presence of viral mutation(s) within the areas targeted by this assay, and inadequate number of viral copies (<131 copies/mL). A negative result must be combined with  clinical observations, patient history, and epidemiological information. The expected result is Negative. Fact Sheet for Patients:  PinkCheek.be Fact Sheet for Healthcare Providers:  GravelBags.it This [name] is not yet ap proved or cleared by the [place] FDA and  has been authorized for detection and/or diagnosis of SARS-CoV-2 by FDA under an Emergency Use Authorization (EUA). This EUA  will remain  in effect (meaning this [name] can be used) for the duration of the COVID-19 declaration under Section 564(b)(1) of the Act, 21 U.S.C. section 360bbb-3(b)(1), unless the authorization is terminated or revoked sooner.    Influenza A by PCR NEGATIVE NEGATIVE Final   Influenza B by PCR NEGATIVE NEGATIVE Final    Comment: (NOTE) The Xpert Xpress SARS-CoV-2/FLU/RSV assay is intended as an aid in  the diagnosis of influenza from Nasopharyngeal swab specimens and  should not be used as a sole basis for treatment. Nasal washings and  aspirates are unacceptable for Xpert Xpress SARS-CoV-2/FLU/RSV  testing. Fact Sheet for Patients: PinkCheek.be Fact Sheet for Healthcare Providers: GravelBags.it This [name] is not yet approved or cleared by the [place] FDA and  has been authorized for detection and/or diagnosis of SARS-CoV-2 by  FDA under an Emergency Use Authorization (EUA). This EUA will remain  in effect (meaning this [name] can be used) for the duration of the  Covid-19 declaration under Section 564(b)(1) of the Act, 21  U.S.C. section 360bbb-3(b)(1), unless the authorization is  terminated or revoked. Performed at [facility], [place] [address] [place], [place] [address]   C Difficile Quick Screen w PCR reflex     Status: None   Collection Time: 05/14/19  5:41 PM   Specimen: Stool  Result Value Ref Range Status   C Diff antigen NEGATIVE NEGATIVE Final   C Diff  toxin NEGATIVE NEGATIVE Final   C Diff interpretation No C. difficile detected.  Final    Comment: Performed at [facility], [place] [address], [place], [place] [address]     Studies: ECHOCARDIOGRAM COMPLETE  Result Date: 05/14/2019    ECHOCARDIOGRAM REPORT   Patient Name:   Rebekah [name] Date of Exam: 05/14/2019 Medical Rec #:  [id number]             Height:       59.0 in Accession #:    [id number]            Weight:       166.4 lb Date of Birth:  03/30/1955            BSA:          1.706 m Patient Age:    3 years              BP:           119/90 mmHg Patient Gender: F                     HR:           99 bpm. Exam Location:  Inpatient Procedure: 2D Echo Indications:   syncope  History:       Patient has no prior history of Echocardiogram examinations.                Cancer.  Sonographer:   Johny Chess Referring      [place] Phys:  Sonographer Comments: Image acquisition challenging due to respiratory motion. IMPRESSIONS  1. Left ventricular ejection fraction, by estimation, is 60 to 65%. The left ventricle has normal function. The left ventricle has no regional wall motion abnormalities. Left ventricular diastolic parameters are consistent with Grade I diastolic dysfunction (impaired relaxation).  2. Right ventricular systolic function is normal. The right ventricular size is normal.  3. Left atrial size was mildly dilated.  4. The mitral valve is normal in structure. No  evidence of mitral valve regurgitation. No evidence of mitral stenosis.  5. The aortic valve is tricuspid. Aortic valve regurgitation is not visualized. No aortic stenosis is present.  6. The inferior vena cava is normal in size with greater than 50% respiratory variability, suggesting right atrial pressure of 3 mmHg. FINDINGS  Left Ventricle: Left ventricular ejection fraction, by estimation, is 60 to 65%. The left ventricle has normal function. The left ventricle has no regional wall motion abnormalities.  The left ventricular internal cavity size was normal in size. There is  no left ventricular hypertrophy. Left ventricular diastolic parameters are consistent with Grade I diastolic dysfunction (impaired relaxation). Right Ventricle: The right ventricular size is normal. No increase in right ventricular wall thickness. Right ventricular systolic function is normal. Left Atrium: Left atrial size was mildly dilated. Right Atrium: Right atrial size was normal in size. Pericardium: Trivial pericardial effusion is present. Mitral Valve: The mitral valve is normal in structure. Normal mobility of the mitral valve leaflets. No evidence of mitral valve regurgitation. No evidence of mitral valve stenosis. Tricuspid Valve: The tricuspid valve is normal in structure. Tricuspid valve regurgitation is not demonstrated. No evidence of tricuspid stenosis. Aortic Valve: The aortic valve is tricuspid. Aortic valve regurgitation is not visualized. No aortic stenosis is present. Pulmonic Valve: The pulmonic valve was normal in structure. Pulmonic valve regurgitation is not visualized. No evidence of pulmonic stenosis. Aorta: The aortic root is normal in size and structure. Venous: The inferior vena cava is normal in size with greater than 50% respiratory variability, suggesting right atrial pressure of 3 mmHg. IAS/Shunts: No atrial level shunt detected by color flow Doppler.  LEFT VENTRICLE PLAX 2D LVIDd:         3.40 cm  Diastology LVIDs:         2.20 cm  LV e' lateral:   9.79 cm/s LV PW:         1.10 cm  LV E/e' lateral: 10.1 LV IVS:        1.00 cm  LV e' medial:    5.77 cm/s LVOT diam:     1.90 cm  LV E/e' medial:  17.1 LVOT Area:     2.84 cm  RIGHT VENTRICLE RV S prime:     17.30 cm/s LEFT ATRIUM           Index       RIGHT ATRIUM          Index LA Vol (A2C): 50.9 ml 29.84 ml/m RA Area:     7.98 cm                                   RA Volume:   12.60 ml 7.39 ml/m   AORTA Ao Root diam: 3.40 cm Ao Asc diam:  3.20 cm MITRAL VALVE  MV Area (PHT): 3.02 cm     SHUNTS MV Decel Time: 251 msec     Systemic Diam: 1.90 cm MV E velocity: 98.80 cm/s MV A velocity: 104.00 cm/s MV E/A ratio:  0.95 Skeet Latch MD Electronically signed by Skeet Latch MD Signature Date/Time: 05/14/2019/4:18:13 PM    Final     Scheduled Meds: . ARIPiprazole  2 mg Oral QHS  . DULoxetine  60 mg Oral BID  . enoxaparin (LOVENOX) injection  40 mg Subcutaneous QHS  . famotidine  20 mg Oral BID  . feeding supplement (ENSURE ENLIVE)  237 mL Oral  BID BM  . folic acid  1 mg Oral Daily  . lamoTRIgine  150 mg Oral q morning - 10a   And  . lamoTRIgine  100 mg Oral QHS  . levETIRAcetam  1,500 mg Oral BID  . potassium chloride  40 mEq Oral Q4H  . sodium chloride flush  3 mL Intravenous Q12H    Continuous Infusions: . sodium chloride    . sodium chloride 75 mL/hr at 05/15/19 0237  . magnesium sulfate bolus IVPB       Time spent: 50mns I have personally reviewed and interpreted on  05/15/2019 daily labs, tele strips, imagings as discussed above under date review session and assessment and plans.  I reviewed all nursing notes, pharmacy notes,   vitals, pertinent old records  I have discussed plan of care as described above with RN , patient  on 05/15/2019   FFlorencia ReasonsMD, PhD, FACP  [facility]  Available via Epic secure chat 7am-7pm for nonurgent issues Please page for urgent issues, pager number available through [email or web address] .   05/15/2019, 8:40 AM  LOS: 2 days

## 2019-05-16 LAB — COMPREHENSIVE METABOLIC PANEL
ALT: 39 U/L (ref 0–44)
AST: 24 U/L (ref 15–41)
Albumin: 3 g/dL — ABNORMAL LOW (ref 3.5–5.0)
Alkaline Phosphatase: 110 U/L (ref 38–126)
Anion gap: 11 (ref 5–15)
BUN: 6 mg/dL — ABNORMAL LOW (ref 8–23)
CO2: 28 mmol/L (ref 22–32)
Calcium: 8.8 mg/dL — ABNORMAL LOW (ref 8.9–10.3)
Chloride: 100 mmol/L (ref 98–111)
Creatinine, Ser: 0.75 mg/dL (ref 0.44–1.00)
GFR calc Af Amer: >60 mL/min (ref >60)
GFR calc non Af Amer: >60 mL/min (ref >60)
Glucose, Bld: 112 mg/dL — ABNORMAL HIGH (ref 70–99)
Potassium: 2.7 mmol/L — CL (ref 3.5–5.1)
Sodium: 139 mmol/L (ref 135–145)
Total Bilirubin: 0.4 mg/dL (ref 0.3–1.2)
Total Protein: 5.8 g/dL — ABNORMAL LOW (ref 6.5–8.1)

## 2019-05-16 LAB — GASTROINTESTINAL PANEL BY PCR, STOOL (REPLACES STOOL CULTURE)

## 2019-05-16 LAB — CBC WITH DIFFERENTIAL/PLATELET
Abs Immature Granulocytes: 0 10*3/uL (ref 0.00–0.07)
Basophils Absolute: 0 10*3/uL (ref 0.0–0.1)
Basophils Relative: 0 %
Eosinophils Absolute: 0 10*3/uL (ref 0.0–0.5)
Eosinophils Relative: 0 %
HCT: 28.4 % — ABNORMAL LOW (ref 36.0–46.0)
Hemoglobin: 9.3 g/dL — ABNORMAL LOW (ref 12.0–15.0)
Lymphocytes Relative: 14 %
Lymphs Abs: 2.5 10*3/uL (ref 0.7–4.0)
MCH: 33 pg (ref 26.0–34.0)
MCHC: 32.7 g/dL (ref 30.0–36.0)
MCV: 100.7 fL — ABNORMAL HIGH (ref 80.0–100.0)
Monocytes Absolute: 1.8 10*3/uL — ABNORMAL HIGH (ref 0.1–1.0)
Monocytes Relative: 10 %
Neutro Abs: 13.3 10*3/uL — ABNORMAL HIGH (ref 1.7–7.7)
Neutrophils Relative %: 76 %
Platelets: 129 10*3/uL — ABNORMAL LOW (ref 150–400)
RBC: 2.82 MIL/uL — ABNORMAL LOW (ref 3.87–5.11)
RDW: 16.7 % — ABNORMAL HIGH (ref 11.5–15.5)
WBC: 17.5 10*3/uL — ABNORMAL HIGH (ref 4.0–10.5)
nRBC: 1.4 % — ABNORMAL HIGH (ref 0.0–0.2)
nRBC: 4 /100 WBC — ABNORMAL HIGH

## 2019-05-16 LAB — GLUCOSE, CAPILLARY: Glucose-Capillary: 100 mg/dL — ABNORMAL HIGH (ref 70–99)

## 2019-05-16 LAB — MAGNESIUM: Magnesium: 1.8 mg/dL (ref 1.7–2.4)

## 2019-05-16 MED ORDER — MAGNESIUM SULFATE 2 GM/50ML IV SOLN
2.0000 g | Freq: Once | INTRAVENOUS | Status: AC
  Administered 2019-05-16: 2 g via INTRAVENOUS
  Filled 2019-05-16: qty 50

## 2019-05-16 MED ORDER — POTASSIUM CHLORIDE CRYS ER 20 MEQ PO TBCR
40.0000 meq | EXTENDED_RELEASE_TABLET | ORAL | Status: AC
  Administered 2019-05-16 (×4): 40 meq via ORAL
  Filled 2019-05-16 (×4): qty 2

## 2019-05-16 MED ORDER — CHOLESTYRAMINE LIGHT 4 G PO PACK
4.0000 g | PACK | Freq: Two times a day (BID) | ORAL | Status: DC
  Administered 2019-05-16 – 2019-05-17 (×2): 4 g via ORAL
  Filled 2019-05-16 (×4): qty 1

## 2019-05-16 NOTE — Progress Notes (Signed)
CRITICAL VALUE ALERT  Critical Value: Potassium - 2.7  Date & Time Notied: 05/16/19 @ 6:33 am  Provider Notified: Erlinda Hong, MD  Orders Received/Actions taken: Orders in Epic

## 2019-05-16 NOTE — Plan of Care (Signed)
  Problem: Education: Goal: Knowledge of General Education information will improve Description Including pain rating scale, medication(s)/side effects and non-pharmacologic comfort measures Outcome: Progressing   

## 2019-05-16 NOTE — Progress Notes (Addendum)
PROGRESS NOTE  Rebekah [name] EXH:[id number] DOB: 11-20-1955 DOA: 05/13/2019 PCP: Rudene Anda, MD  Brief summary:  medical history significant of active endometrial cancer with mets to the lungs per patient, on chemotherapy, MDD, seizures on Lamictal and Keppra presents to the ER for syncopal episode that occurred Tuesday night..  She states she was going to the bathroom and then she felt woozy and passed after calling out her daughter's name. Her daughter found her on the floor up against the bathtube face down. The daughter rolled her over and the patient seemed to regain conciousness with a minute or two.   1 in the ER as she was transferring to her bed in the room due to loss of balance per patient  HPI/Recap of past 24 hours:   no fever , continue to have watery diarrhea, k remain very low  Assessment/Plan: Active Problems:   Major depressive disorder, recurrent episode, moderate (HCC)   Orthostatic hypotension   Endometrial cancer (HCC)   Generalized anxiety disorder   Seizures (HCC)   Syncope   Syncope and collapse  Recurrent syncope, denies seizure activities -CT head negative -High-sensitivity troponin unremarkable -Echocardiogram LVEF 38%, grade 1 diastolic dysfunction -Has Ortho static hypotension in the ED, she appears dehydrated on physical exam, will continue hydration - repeat orthostatic is improving -Keep on telemetry  Acute anemia/thrombocytopenia -No sign of bleeding, repeat lab at noon -FOBT negative ,  B12/ folate/ iron panel/ tbili unremarkable,  reticulocyte count inappropriately low -Suspect bone marrow suppression from chemo and anemia of chronic disease - monitor, transfuse as needed  Hypokalemia K2.6 on presentation, ,remain very low continue supplement Keep on tele  Hypomagnesemia Continue Replace mag IV   Leukocytosis No fever, blood culture negative  Reactive? Vs dehydration She denies recent gcsf use Monitor  AKI on CKD  2 -BUN 22 creatinine 1.40 -UA small leukocyte, rare bacteria, urine culture no significant growth, she denies urinary symptom -AKI likely due to dehydration, improved after hydration  QTC prolongation QTC 518 on presentation keep on telemetry Keep K above 4, mag above 2  Diarrhea; report watery Colonoscopy on 4/12 reported unremarkable (per patient) FOBt negative C. difficile negativeand GI PCR panel negative Started on imodium prn, schedule cholestyramine  bid  seizures on Lamictal and Keppra , denies seizure activities  endometrial cancer with mets to the lungs per patient Sees Dr. Claiborne Billings for her cancer through [facility]. Patient states she received chemotherapy last Wednesday  DVT Prophylaxis: Subcu Lovenox  Code Status: Full  Family Communication: patient   Disposition Plan:    Patient came from:        Home                                                                                                   Anticipated d/c place:  Home  Barriers to d/c OR conditions which need to be met to effect a safe d/c:  Electrolyte abnormality, poor appetite require IV hydration, likely discharge in 24 to 48 hours   Consultants:  None  Procedures:  None  Antibiotics:  None   Objective: BP 104/66 (BP Location: Right Arm)   Pulse 95   Temp 97.9 F (36.6 C) (Oral)   Resp 16   Ht 5' (1.524 m)   Wt 76.2 kg   SpO2 96%   BMI 32.81 kg/m   Intake/Output Summary (Last 24 hours) at 05/16/2019 0711 Last data filed at 05/16/2019 0600 Gross per 24 hour  Intake 1778.21 ml  Output 0 ml  Net 1778.21 ml   Filed Weights   05/14/19 1250 05/14/19 1941 05/15/19 2107  Weight: 75.6 kg 75.8 kg 76.2 kg    Exam: Patient is examined daily including today on 05/16/2019, exams remain the same as of yesterday except that has changed    General: Pale , alopecia ,NAD  Cardiovascular: RRR  Respiratory: CTABL  Abdomen: Soft/ND/NT, positive  BS  Musculoskeletal: No Edema  Neuro: alert, oriented x3  Data Reviewed: Basic Metabolic Panel: Recent Labs  Lab 05/13/19 1135 05/13/19 1716 05/13/19 2300 05/14/19 0506 05/15/19 0629 05/16/19 0423  NA 133*  --   --  137 136 139  K 2.6*  --  3.0* 3.2* 2.5* 2.7*  CL 90*  --   --  100 100 100  CO2 28  --   --  '26 27 28  '$ GLUCOSE 140*  --   --  102* 106* 112*  BUN 22  --   --  16 7* 6*  CREATININE 1.40*  --   --  0.92 0.74 0.75  CALCIUM 9.5  --   --  8.7* 8.2* 8.8*  MG  --  1.8  --   --  1.8 1.8   Liver Function Tests: Recent Labs  Lab 05/15/19 1153 05/16/19 0423  AST 29 24  ALT 47* 39  ALKPHOS 110 110  BILITOT 0.5 0.4  PROT 5.9* 5.8*  ALBUMIN 3.0* 3.0*   No results for input(s): LIPASE, AMYLASE in the last 168 hours. No results for input(s): AMMONIA in the last 168 hours. CBC: Recent Labs  Lab 05/13/19 1135 05/15/19 0629 05/15/19 1153 05/16/19 0423  WBC 3.7* 14.9* 16.6* 17.5*  NEUTROABS  --  8.8*  --  13.3*  HGB 13.1 9.2* 9.6* 9.3*  HCT 38.3 27.2* 29.2* 28.4*  MCV 97.0 101.1* 99.7 100.7*  PLT 152 123* 127* 129*   Cardiac Enzymes:   Recent Labs  Lab 05/13/19 1716  CKTOTAL 137   BNP (last 3 results) No results for input(s): BNP in the last 8760 hours.  ProBNP (last 3 results) No results for input(s): PROBNP in the last 8760 hours.  CBG: Recent Labs  Lab 05/13/19 1503 05/14/19 0711 05/15/19 0645 05/16/19 0638  GLUCAP 120* 108* 97 100*    Recent Results (from the past 240 hour(s))  Respiratory Panel by RT PCR (Flu A&B, Covid) - Nasopharyngeal Swab     Status: None   Collection Time: 05/13/19  7:37 PM   Specimen: Nasopharyngeal Swab  Result Value Ref Range Status   SARS Coronavirus 2 by RT PCR NEGATIVE NEGATIVE Final    Comment: (NOTE) SARS-CoV-2 target nucleic acids are NOT DETECTED. The SARS-CoV-2 RNA is generally detectable in upper respiratoy specimens during the acute phase of infection. The lowest concentration of SARS-CoV-2 viral  copies this assay can detect is 131 copies/mL. A negative result does not preclude SARS-Cov-2 infection and should not be used as the sole basis for treatment or other patient management decisions. A negative result may occur with  improper specimen collection/handling, submission of specimen other than nasopharyngeal  swab, presence of viral mutation(s) within the areas targeted by this assay, and inadequate number of viral copies (<131 copies/mL). A negative result must be combined with clinical observations, patient history, and epidemiological information. The expected result is Negative. Fact Sheet for Patients:  PinkCheek.be Fact Sheet for Healthcare Providers:  GravelBags.it This test is not yet ap proved or cleared by the [place] FDA and  has been authorized for detection and/or diagnosis of SARS-CoV-2 by FDA under an Emergency Use Authorization (EUA). This EUA will remain  in effect (meaning this test can be used) for the duration of the COVID-19 declaration under Section 564(b)(1) of the Act, 21 U.S.C. section 360bbb-3(b)(1), unless the authorization is terminated or revoked sooner.    Influenza A by PCR NEGATIVE NEGATIVE Final   Influenza B by PCR NEGATIVE NEGATIVE Final    Comment: (NOTE) The Xpert Xpress SARS-CoV-2/FLU/RSV assay is intended as an aid in  the diagnosis of influenza from Nasopharyngeal swab specimens and  should not be used as a sole basis for treatment. Nasal washings and  aspirates are unacceptable for Xpert Xpress SARS-CoV-2/FLU/RSV  testing. Fact Sheet for Patients: PinkCheek.be Fact Sheet for Healthcare Providers: GravelBags.it This test is not yet approved or cleared by the [place] FDA and  has been authorized for detection and/or diagnosis of SARS-CoV-2 by  FDA under an Emergency Use Authorization (EUA). This EUA will  remain  in effect (meaning this test can be used) for the duration of the  Covid-19 declaration under Section 564(b)(1) of the Act, 21  U.S.C. section 360bbb-3(b)(1), unless the authorization is  terminated or revoked. Performed at [facility], [place] [address], [place], [place] [address]   Gastrointestinal Panel by PCR , Stool     Status: None   Collection Time: 05/14/19  5:41 PM   Specimen: Stool  Result Value Ref Range Status   Campylobacter species NOT DETECTED NOT DETECTED Final   Plesimonas shigelloides NOT DETECTED NOT DETECTED Final   Salmonella species NOT DETECTED NOT DETECTED Final   Yersinia enterocolitica NOT DETECTED NOT DETECTED Final   Vibrio species NOT DETECTED NOT DETECTED Final   Vibrio cholerae NOT DETECTED NOT DETECTED Final   Enteroaggregative E coli (EAEC) NOT DETECTED NOT DETECTED Final   Enteropathogenic E coli (EPEC) NOT DETECTED NOT DETECTED Final   Enterotoxigenic E coli (ETEC) NOT DETECTED NOT DETECTED Final   Shiga like toxin producing E coli (STEC) NOT DETECTED NOT DETECTED Final   Shigella/Enteroinvasive E coli (EIEC) NOT DETECTED NOT DETECTED Final   Cryptosporidium NOT DETECTED NOT DETECTED Final   Cyclospora cayetanensis NOT DETECTED NOT DETECTED Final   Entamoeba histolytica NOT DETECTED NOT DETECTED Final   Giardia lamblia NOT DETECTED NOT DETECTED Final   Adenovirus F40/41 NOT DETECTED NOT DETECTED Final   Astrovirus NOT DETECTED NOT DETECTED Final   Norovirus GI/GII NOT DETECTED NOT DETECTED Final   Rotavirus A NOT DETECTED NOT DETECTED Final   Sapovirus (I, II, IV, and V) NOT DETECTED NOT DETECTED Final    Comment: Performed at [facility], [address] [place], [place] [address]  C Difficile Quick Screen w PCR reflex     Status: None   Collection Time: 05/14/19  5:41 PM   Specimen: Stool  Result Value Ref Range Status   C Diff antigen NEGATIVE NEGATIVE Final   C Diff toxin NEGATIVE NEGATIVE Final   C Diff  interpretation No C. difficile detected.  Final    Comment: Performed at [facility]  [facility], [place] [address], [place], [place] [address]     Studies: No results found.  Scheduled Meds: . ARIPiprazole  2 mg Oral QHS  . DULoxetine  60 mg Oral BID  . enoxaparin (LOVENOX) injection  40 mg Subcutaneous QHS  . famotidine  20 mg Oral BID  . feeding supplement (ENSURE ENLIVE)  237 mL Oral TID BM  . folic acid  1 mg Oral Daily  . lamoTRIgine  150 mg Oral q morning - 10a   And  . lamoTRIgine  100 mg Oral QHS  . levETIRAcetam  1,500 mg Oral BID  . multivitamin with minerals  1 tablet Oral Daily  . potassium chloride  40 mEq Oral Q3H  . sodium chloride flush  3 mL Intravenous Q12H    Continuous Infusions: . sodium chloride    . sodium chloride 75 mL/hr at 05/15/19 1021  . magnesium sulfate bolus IVPB       Time spent: 42mns I have personally reviewed and interpreted on  05/16/2019 daily labs, tele strips, imagings as discussed above under date review session and assessment and plans.  I reviewed all nursing notes, pharmacy notes,   vitals, pertinent old records  I have discussed plan of care as described above with RN , patient  on 05/16/2019   FFlorencia ReasonsMD, PhD, FACP  [facility]  Available via Epic secure chat 7am-7pm for nonurgent issues Please page for urgent issues, pager number available through [email or web address] .   05/16/2019, 7:11 AM  LOS: 3 days

## 2019-05-17 LAB — CBC WITH DIFFERENTIAL/PLATELET
Abs Immature Granulocytes: 1.14 10*3/uL — ABNORMAL HIGH (ref 0.00–0.07)
Basophils Absolute: 0.1 10*3/uL (ref 0.0–0.1)
Basophils Relative: 1 %
Eosinophils Absolute: 0 10*3/uL (ref 0.0–0.5)
Eosinophils Relative: 0 %
HCT: 30.4 % — ABNORMAL LOW (ref 36.0–46.0)
Hemoglobin: 9.8 g/dL — ABNORMAL LOW (ref 12.0–15.0)
Immature Granulocytes: 9 %
Lymphocytes Relative: 29 %
Lymphs Abs: 3.8 10*3/uL (ref 0.7–4.0)
MCH: 33.2 pg (ref 26.0–34.0)
MCHC: 32.2 g/dL (ref 30.0–36.0)
MCV: 103.1 fL — ABNORMAL HIGH (ref 80.0–100.0)
Monocytes Absolute: 1.5 10*3/uL — ABNORMAL HIGH (ref 0.1–1.0)
Monocytes Relative: 11 %
Neutro Abs: 6.6 10*3/uL (ref 1.7–7.7)
Neutrophils Relative %: 50 %
Platelets: 144 10*3/uL — ABNORMAL LOW (ref 150–400)
RBC: 2.95 MIL/uL — ABNORMAL LOW (ref 3.87–5.11)
RDW: 17.6 % — ABNORMAL HIGH (ref 11.5–15.5)
WBC: 13.1 10*3/uL — ABNORMAL HIGH (ref 4.0–10.5)
nRBC: 1.1 % — ABNORMAL HIGH (ref 0.0–0.2)

## 2019-05-17 LAB — BASIC METABOLIC PANEL
Anion gap: 11 (ref 5–15)
BUN: 6 mg/dL — ABNORMAL LOW (ref 8–23)
CO2: 26 mmol/L (ref 22–32)
Calcium: 9 mg/dL (ref 8.9–10.3)
Chloride: 102 mmol/L (ref 98–111)
Creatinine, Ser: 0.74 mg/dL (ref 0.44–1.00)
GFR calc Af Amer: >60 mL/min (ref >60)
GFR calc non Af Amer: >60 mL/min (ref >60)
Glucose, Bld: 110 mg/dL — ABNORMAL HIGH (ref 70–99)
Potassium: 3.6 mmol/L (ref 3.5–5.1)
Sodium: 139 mmol/L (ref 135–145)

## 2019-05-17 LAB — MAGNESIUM: Magnesium: 1.6 mg/dL — ABNORMAL LOW (ref 1.7–2.4)

## 2019-05-17 LAB — GLUCOSE, CAPILLARY: Glucose-Capillary: 78 mg/dL (ref 70–99)

## 2019-05-17 MED ORDER — MAGNESIUM SULFATE 4 GM/100ML IV SOLN
4.0000 g | Freq: Once | INTRAVENOUS | Status: AC
  Administered 2019-05-17: 4 g via INTRAVENOUS
  Filled 2019-05-17: qty 100

## 2019-05-17 MED ORDER — LOPERAMIDE HCL 2 MG PO CAPS: 2.0000 mg | ORAL_CAPSULE | Freq: Four times a day (QID) | ORAL | 0 refills | Status: AC | PRN

## 2019-05-17 MED ORDER — POTASSIUM CHLORIDE CRYS ER 20 MEQ PO TBCR
40.0000 meq | EXTENDED_RELEASE_TABLET | Freq: Once | ORAL | Status: AC
  Administered 2019-05-17: 40 meq via ORAL
  Filled 2019-05-17: qty 2

## 2019-05-17 MED ORDER — CHOLESTYRAMINE LIGHT 4 G PO PACK: 4.0000 g | PACK | Freq: Two times a day (BID) | ORAL | 0 refills | Status: DC

## 2019-05-17 NOTE — Plan of Care (Signed)

## 2019-05-17 NOTE — Progress Notes (Signed)
Referral received to arrange HHPT. Met with pt at bedside. She lives with her daughter and grandchildren. She plans to return home with the support of her daughter and her daughter's mother-in-law. PT is recommending a cane. Informed pt that she can purchase a cane at [facility] or any drug store. She reports that she will ask her daughter to buy the cane. Discussed preference for a [facility] agency. She reports that she doesn't have a preference. Provided pt with a Medicare.gov HH compare agencies list. She chose [facility]. Contacted Cheryl with [facility] for referral.

## 2019-05-17 NOTE — Discharge Summary (Signed)
Discharge Summary  Rebekah [name] [id number] DOB: [birth year]  PCP: Rudene Anda, MD  Admit date: 05/13/2019 Discharge date: 05/17/2019  Time spent: 48mins, more than 50% time spent on coordination of care.  Recommendations for Outpatient Follow-up:  1. F/u with PCP within a week  for hospital discharge follow up, repeat cbc/bmp at follow up 2. F/u with hematology/ oncology on 5/12  Discharge Diagnoses:  Active Hospital Problems   Diagnosis Date Noted  . Syncope 05/13/2019  . Syncope and collapse 05/13/2019  . Generalized anxiety disorder 05/29/2018  . Seizures ([place]) 05/29/2018  . Endometrial cancer ([place]) 03/26/2017  . Orthostatic hypotension 11/07/2015  . Major depressive disorder, recurrent episode, moderate ([place]) 05/31/2014    Resolved Hospital Problems  No resolved problems to display.    Discharge Condition: stable  Diet recommendation: regular diet  Filed Weights   05/14/19 1250 05/14/19 1941 05/15/19 2107  Weight: 75.6 kg 75.8 kg 76.2 kg    History of present illness: (per admitting MD Dr Linda Hedges)  PCP: Rudene Anda, MD (Confirm with patient/family/NH records and if not entered, this has to be entered at [facility] point of entry) Patient coming from: home  I have personally briefly reviewed patient's old medical records in [place]  Chief Complaint: multiple syncopal episodes  HPI: Rebekah [name] is a 64 y.o. female with medical history significant of active endometrial cancer with mets to the lungs per patient, on chemotherapy, MDD, seizures on Lamictal and Keppra presents to the ER for syncopal episode that occurred Tuesday night.. Patient is followed by Dr. Claiborne Billings for her cancer through [facility]. Patient states she received chemotherapy last Wednesday, and since then has not been feeling herself. Patient notes increased weakness, getting tired more easily, dizziness and losing her balance. Dizziness worse with  movement, but does not occur at rest. She has had 2 falls within this last week including 1 in the ER as she was transferring to her bed in the room per triage notes. She reports syncopal episode that brought her into the hospital occurred in her bathroom. She states she was going to the bathroom and then she felt woozy and passed after calling out her daughter's name. Her daughter found her on the floor up against the bathtube face down. The daughter rolled her over and the patient seemed to regain conciousness with a minute or two. She does have a history of seizures. She reports compliance with medications.  This episode did feel like previous seizures.She does not note a change in her appetite or fluid consumption. She denies any fevers, chills, shortness of breath, chest pain, back pain, abdominal pain, nausea, vomiting, dysuria, hematuria, blood in stool, constipation. She does endorse chronic diarrhea which she has been having for the last 10 months which she attributes to her chemotherapy regimen. Per chart review in Care Everywhere, it appears that the patient has tried multiple medications, has had a colonoscopy which was negative.   ED Course: Afebrile. Peer EDP patient did have drop in BP with position change. CT head negative. Lab notable for K 2.6, Mg 1.8, nl CBC. Troponin #1 11, #2 12. Patient was given 1 L NS bolus. She received 10 meq K IV and 20 meq orally. TRH is called to admit patient for further evaluation and management.  Hospital Course:  Active Problems:   Major depressive disorder, recurrent episode, moderate (HCC)   Orthostatic hypotension   Endometrial cancer (HCC)   Generalized anxiety disorder   Seizures ([place])  Syncope   Syncope and collapse   Recurrent syncope, denies seizure activities -CT head negative -High-sensitivity troponin unremarkable -Echocardiogram LVEF [id number], grade 1 diastolic dysfunction -Tele unremarkable -resolved after hydration  Acute  anemia/thrombocytopenia -No sign of bleeding, repeat lab at noon -FOBT negative ,  B12/ folate/ iron panel/ tbili unremarkable,  reticulocyte count inappropriately low -Suspect bone marrow suppression from chemo and anemia of chronic disease -hgb 9.8, plt 144 at discharge -f/u with hematology/oncology  Hypokalemia Replaced and normalized She is discharged on oral potassium supplement F/u with pcp next week to repeat labs   Hypomagnesemia Received iv mag    Leukocytosis No fever, blood culture negative  Reactive? Vs dehydration She denies recent gcsf use Wbc peak at 17,5 , wbc 13 at discharge  AKI on CKD 2 -BUN 22 creatinine 1.40 -UA small leukocyte, rare bacteria, urine culture no significant growth, she denies urinary symptom -AKI likely due to dehydration, aki resolved  after hydration  QTC prolongation QTC 518 on presentation K/mag improved telemetry unremarkable  Diarrhea; report watery Colonoscopy on 4/12 reported unremarkable (per patient) FOBt negative C. difficile negativeand GI PCR panel negative Started on imodium prn, schedule cholestyramine  bid F/u with GI  seizures on Lamictal and Keppra , denies seizure activities  endometrialcancer with mets to the lungs per patient Sees Dr. Claiborne Billings for her cancer through [facility] Patient states she received chemotherapy last Wednesday  DVT Prophylaxis: Subcu Lovenox  Code Status: Full  Family Communication: patient   Disposition Plan:    Patient came from: Home  Anticipated d/c place: Home with home health   Consultants:  None  Procedures:  None  Antibiotics:  None   Discharge Exam: BP 99/74 (BP Location: Left Arm)   Pulse 86   Temp 98.5 F (36.9 C) (Oral)   Resp 17   Ht 5' (1.524 m)   Wt 76.2 kg   SpO2 94%   BMI 32.81 kg/m    General:   alopecia ,NAD  Cardiovascular: RRR  Respiratory: CTABL  Abdomen: Soft/ND/NT, positive BS  Musculoskeletal: No Edema  Neuro: alert, oriented x3   Discharge Instructions You were cared for by a hospitalist during your hospital stay. If you have any questions about your discharge medications or the care you received while you were in the hospital after you are discharged, you can call the unit and asked to speak with the hospitalist on call if the hospitalist that took care of you is not available. Once you are discharged, your primary care physician will handle any further medical issues. Please note that NO REFILLS for any discharge medications will be authorized once you are discharged, as it is imperative that you return to your primary care physician (or establish a relationship with a primary care physician if you do not have one) for your aftercare needs so that they can reassess your need for medications and monitor your lab values.  Discharge Instructions    Diet general   Complete by: As directed    Increase activity slowly   Complete by: As directed      Allergies as of 05/17/2019      Reactions   Ambien [zolpidem Tartrate] Other (See Comments)   Other reaction(s): Hallucination   Soma [carisoprodol] Other (See Comments)   chills      Medication List    STOP taking these medications   naproxen 500 MG tablet Commonly known as: Naprosyn     TAKE these medications   ARIPiprazole  2 MG tablet Commonly known as: Abilify Take 1 tablet (2 mg total) by mouth at bedtime.   cholestyramine light 4 g packet Commonly known as: PREVALITE Take 1 packet (4 g total) by mouth 2 (two) times daily.   DULoxetine 60 MG capsule Commonly known as: CYMBALTA Take 1 capsule (60 mg total) by mouth 2 (two) times daily.   folic acid 1 MG tablet Commonly known as: FOLVITE Take 1 mg by mouth daily.   hydrOXYzine 25 MG tablet Commonly known as: ATARAX/VISTARIL Take 50 mg by mouth at  bedtime as needed for anxiety.   lamoTRIgine 100 MG tablet Commonly known as: LAMICTAL Take 100 mg by mouth See admin instructions. 150mg  in the morning, 100mg  at night.   levETIRAcetam 1000 MG tablet Commonly known as: KEPPRA Take 1,500 mg by mouth 2 (two) times daily.   loperamide 2 MG capsule Commonly known as: IMODIUM Take 1 capsule (2 mg total) by mouth every 6 (six) hours as needed for diarrhea or loose stools.   LORazepam 0.5 MG tablet Commonly known as: ATIVAN Take 1 tablet (0.5 mg total) by mouth every 8 (eight) hours as needed for anxiety or sleep.   Melatonin 12 MG Tabs Take 1 tablet by mouth as needed (sleep).   ondansetron 4 MG disintegrating tablet Commonly known as: ZOFRAN-ODT Take 4 mg by mouth every 8 (eight) hours as needed for nausea or vomiting.   potassium chloride 10 MEQ tablet Commonly known as: KLOR-CON Take 20 mEq by mouth daily.   prochlorperazine 10 MG tablet Commonly known as: COMPAZINE Take 10 mg by mouth every 6 (six) hours as needed for nausea.   ranitidine 150 MG capsule Commonly known as: ZANTAC Take 1 capsule (150 mg total) by mouth 2 (two) times daily.      Allergies  Allergen Reactions  . Ambien [Zolpidem Tartrate] Other (See Comments)    Other reaction(s): Hallucination  . Soma [Carisoprodol] Other (See Comments)    chills   Follow-up Information    Rudene Anda, MD Follow up in 1 week(s).   Specialty: Internal Medicine Why: hospital discharge follow up, repeat basic lab works including cbc/bmp/mag Contact information: [id number] [place] [address] [phone]        f/u with oncology Follow up.   Why: on 5/12           The results of significant diagnostics from this hospitalization (including imaging, microbiology, ancillary and laboratory) are listed below for reference.    Significant Diagnostic Studies: CT Head Wo Contrast  Result Date: 05/13/2019 CLINICAL DATA:  Head trauma, headache.  Additional history provided: Recent falls and head trauma, patient with history of lung cancer, chemotherapy EXAM: CT HEAD WITHOUT CONTRAST TECHNIQUE: Contiguous axial images were obtained from the base of the skull through the vertex without intravenous contrast. COMPARISON:  No pertinent prior studies available for comparison. FINDINGS: Brain: Please note there is limited assessment for intracranial metastatic disease on this non-contrast head CT. There is no acute intracranial hemorrhage. No demarcated cortical infarct. No extra-axial fluid collection. No evidence of intracranial mass. No midline shift. Partially empty sella turcica. Vascular: No hyperdense vessel.  Atherosclerotic calcifications Skull: Negative for fracture. Multiple arachnoid pits within the floor of the middle cranial fossa. Sinuses/Orbits: Visualized orbits show no acute finding. Mild ethmoid and maxillary sinus mucosal thickening at the imaged levels. No significant mastoid effusion. IMPRESSION: 1. Please note assessment for intracranial metastatic disease is limited on this non-contrast head CT. 2. No  evidence of acute intracranial hemorrhage. 3. Partially empty sella turcica with multiple middle cranial fossa arachnoid pits. These findings may be seen in the setting of idiopathic intracranial hypertension (pseudotumor cerebri). 4. Mild paranasal sinus mucosal thickening. Electronically Signed   By: Kellie Simmering DO   On: 05/13/2019 17:40   MR BRAIN WO CONTRAST  Result Date: 05/13/2019 CLINICAL DATA:  Initial evaluation for acute syncope. EXAM: MRI HEAD WITHOUT CONTRAST TECHNIQUE: Multiplanar, multiecho pulse sequences of the brain and surrounding structures were obtained without intravenous contrast. COMPARISON:  Prior head CT from 05/13/2019. FINDINGS: Brain: Cerebral volume within normal limits for age. No significant cerebral white matter disease. There is a punctate focus of apparent diffusion abnormality seen involving the  subcortical white matter of the left parietal lobe (series 2, image 24). This is not seen on corresponding sequences, and there is no associated ADC correlate. Finding favored to be artifactual in nature. No other foci of restricted diffusion to suggest acute or subacute ischemia. Gray-white matter differentiation maintained. No encephalomalacia to suggest chronic cortical infarction. No foci of susceptibility artifact to suggest acute or chronic intracranial hemorrhage. No mass lesion, midline shift or mass effect. No hydrocephalus. Probable 11 mm arachnoid cyst noted at the right cavernous sinus (series 5, image 7). Mild mass effect on the adjacent cavernous right ICA. No other extra-axial fluid collection. Incidental note made of an empty sella. Midline structures intact. Vascular: Major intracranial vascular flow voids well maintained. Skull and upper cervical spine: Craniocervical junction normal. Upper cervical spine within normal limits. Bone marrow signal intensity normal. No scalp soft tissue abnormality. Sinuses/Orbits: Globes and orbital soft tissues within normal limits. Mild mucosal thickening noted within the ethmoidal air cells and maxillary sinuses. Paranasal sinuses are otherwise clear. No mastoid effusion. Inner ear structures grossly normal. Other: None. IMPRESSION: 1. No acute intracranial abnormality. 2. Empty sella. While this finding is often incidental in nature and of no clinical significance, this can also be seen in the setting of idiopathic intracranial hypertension. 3. Probable 11 mm benign arachnoid cyst at the right cavernous sinus. Electronically Signed   By: Jeannine Boga M.D.   On: 05/13/2019 23:39   DG Chest Portable 1 View  Result Date: 05/13/2019 CLINICAL DATA:  Syncope. Active chemotherapy. Fatigue and weakness. EXAM: PORTABLE CHEST 1 VIEW COMPARISON:  None. FINDINGS: Tip of the right chest port in the lower SVC. Low lung volumes. Minor bibasilar atelectasis. Heart  size grossly normal, not well assessed by low lung volumes. Mediastinal contours are normal. Subsegmental atelectasis or scarring in the left lower lung zone. No confluent airspace disease, pulmonary edema, large pleural effusion or pneumothorax. No acute osseous abnormalities are seen. IMPRESSION: Low lung volumes with bibasilar atelectasis. Electronically Signed   By: Keith Rake M.D.   On: 05/13/2019 15:03   ECHOCARDIOGRAM COMPLETE  Result Date: 05/14/2019    ECHOCARDIOGRAM REPORT   Patient Name:   Rebekah [name] Date of Exam: 05/14/2019 Medical Rec #:  [id number]             Height:       59.0 in Accession #:    [id number]            Weight:       166.4 lb Date of Birth:  1955/04/04            BSA:          1.706 m Patient Age:    73 years  BP:           119/90 mmHg Patient Gender: F                     HR:           99 bpm. Exam Location:  Inpatient Procedure: 2D Echo Indications:   syncope  History:       Patient has no prior history of Echocardiogram examinations.                Cancer.  Sonographer:   Johny Chess Referring      [place] Phys:  Sonographer Comments: Image acquisition challenging due to respiratory motion. IMPRESSIONS  1. Left ventricular ejection fraction, by estimation, is 60 to 65%. The left ventricle has normal function. The left ventricle has no regional wall motion abnormalities. Left ventricular diastolic parameters are consistent with Grade I diastolic dysfunction (impaired relaxation).  2. Right ventricular systolic function is normal. The right ventricular size is normal.  3. Left atrial size was mildly dilated.  4. The mitral valve is normal in structure. No evidence of mitral valve regurgitation. No evidence of mitral stenosis.  5. The aortic valve is tricuspid. Aortic valve regurgitation is not visualized. No aortic stenosis is present.  6. The inferior vena cava is normal in size with greater than 50% respiratory variability,  suggesting right atrial pressure of 3 mmHg. FINDINGS  Left Ventricle: Left ventricular ejection fraction, by estimation, is 60 to 65%. The left ventricle has normal function. The left ventricle has no regional wall motion abnormalities. The left ventricular internal cavity size was normal in size. There is  no left ventricular hypertrophy. Left ventricular diastolic parameters are consistent with Grade I diastolic dysfunction (impaired relaxation). Right Ventricle: The right ventricular size is normal. No increase in right ventricular wall thickness. Right ventricular systolic function is normal. Left Atrium: Left atrial size was mildly dilated. Right Atrium: Right atrial size was normal in size. Pericardium: Trivial pericardial effusion is present. Mitral Valve: The mitral valve is normal in structure. Normal mobility of the mitral valve leaflets. No evidence of mitral valve regurgitation. No evidence of mitral valve stenosis. Tricuspid Valve: The tricuspid valve is normal in structure. Tricuspid valve regurgitation is not demonstrated. No evidence of tricuspid stenosis. Aortic Valve: The aortic valve is tricuspid. Aortic valve regurgitation is not visualized. No aortic stenosis is present. Pulmonic Valve: The pulmonic valve was normal in structure. Pulmonic valve regurgitation is not visualized. No evidence of pulmonic stenosis. Aorta: The aortic root is normal in size and structure. Venous: The inferior vena cava is normal in size with greater than 50% respiratory variability, suggesting right atrial pressure of 3 mmHg. IAS/Shunts: No atrial level shunt detected by color flow Doppler.  LEFT VENTRICLE PLAX 2D LVIDd:         3.40 cm  Diastology LVIDs:         2.20 cm  LV e' lateral:   9.79 cm/s LV PW:         1.10 cm  LV E/e' lateral: 10.1 LV IVS:        1.00 cm  LV e' medial:    5.77 cm/s LVOT diam:     1.90 cm  LV E/e' medial:  17.1 LVOT Area:     2.84 cm  RIGHT VENTRICLE RV S prime:     17.30 cm/s LEFT ATRIUM            Index  RIGHT ATRIUM          Index LA Vol (A2C): 50.9 ml 29.84 ml/m RA Area:     7.98 cm                                   RA Volume:   12.60 ml 7.39 ml/m   AORTA Ao Root diam: 3.40 cm Ao Asc diam:  3.20 cm MITRAL VALVE MV Area (PHT): 3.02 cm     SHUNTS MV Decel Time: 251 msec     Systemic Diam: 1.90 cm MV E velocity: 98.80 cm/s MV A velocity: 104.00 cm/s MV E/A ratio:  0.95 Skeet Latch MD Electronically signed by Skeet Latch MD Signature Date/Time: 05/14/2019/4:18:13 PM    Final     Microbiology: Recent Results (from the past 240 hour(s))  Respiratory Panel by RT PCR (Flu A&B, Covid) - Nasopharyngeal Swab     Status: None   Collection Time: 05/13/19  7:37 PM   Specimen: Nasopharyngeal Swab  Result Value Ref Range Status   SARS Coronavirus 2 by RT PCR NEGATIVE NEGATIVE Final    Comment: (NOTE) SARS-CoV-2 target nucleic acids are NOT DETECTED. The SARS-CoV-2 RNA is generally detectable in upper respiratoy specimens during the acute phase of infection. The lowest concentration of SARS-CoV-2 viral copies this assay can detect is 131 copies/mL. A negative result does not preclude SARS-Cov-2 infection and should not be used as the sole basis for treatment or other patient management decisions. A negative result may occur with  improper specimen collection/handling, submission of specimen other than nasopharyngeal swab, presence of viral mutation(s) within the areas targeted by this assay, and inadequate number of viral copies (<131 copies/mL). A negative result must be combined with clinical observations, patient history, and epidemiological information. The expected result is Negative. Fact Sheet for Patients:  PinkCheek.be Fact Sheet for Healthcare Providers:  GravelBags.it This test is not yet ap proved or cleared by the [place] FDA and  has been authorized for detection and/or diagnosis of SARS-CoV-2  by FDA under an Emergency Use Authorization (EUA). This EUA will remain  in effect (meaning this test can be used) for the duration of the COVID-19 declaration under Section 564(b)(1) of the Act, 21 U.S.C. section 360bbb-3(b)(1), unless the authorization is terminated or revoked sooner.    Influenza A by PCR NEGATIVE NEGATIVE Final   Influenza B by PCR NEGATIVE NEGATIVE Final    Comment: (NOTE) The Xpert Xpress SARS-CoV-2/FLU/RSV assay is intended as an aid in  the diagnosis of influenza from Nasopharyngeal swab specimens and  should not be used as a sole basis for treatment. Nasal washings and  aspirates are unacceptable for Xpert Xpress SARS-CoV-2/FLU/RSV  testing. Fact Sheet for Patients: PinkCheek.be Fact Sheet for Healthcare Providers: GravelBags.it This test is not yet approved or cleared by the [place] FDA and  has been authorized for detection and/or diagnosis of SARS-CoV-2 by  FDA under an Emergency Use Authorization (EUA). This EUA will remain  in effect (meaning this test can be used) for the duration of the  Covid-19 declaration under Section 564(b)(1) of the Act, 21  U.S.C. section 360bbb-3(b)(1), unless the authorization is  terminated or revoked. Performed at [facility], [place] [address], [place], [place] [address]   Gastrointestinal Panel by PCR , Stool     Status: None   Collection Time: 05/14/19  5:41 PM   Specimen: Stool  Result  Value Ref Range Status   Campylobacter species NOT DETECTED NOT DETECTED Final   Plesimonas shigelloides NOT DETECTED NOT DETECTED Final   Salmonella species NOT DETECTED NOT DETECTED Final   Yersinia enterocolitica NOT DETECTED NOT DETECTED Final   Vibrio species NOT DETECTED NOT DETECTED Final   Vibrio cholerae NOT DETECTED NOT DETECTED Final   Enteroaggregative E coli (EAEC) NOT DETECTED NOT DETECTED Final   Enteropathogenic E coli (EPEC) NOT DETECTED NOT  DETECTED Final   Enterotoxigenic E coli (ETEC) NOT DETECTED NOT DETECTED Final   Shiga like toxin producing E coli (STEC) NOT DETECTED NOT DETECTED Final   Shigella/Enteroinvasive E coli (EIEC) NOT DETECTED NOT DETECTED Final   Cryptosporidium NOT DETECTED NOT DETECTED Final   Cyclospora cayetanensis NOT DETECTED NOT DETECTED Final   Entamoeba histolytica NOT DETECTED NOT DETECTED Final   Giardia lamblia NOT DETECTED NOT DETECTED Final   Adenovirus F40/41 NOT DETECTED NOT DETECTED Final   Astrovirus NOT DETECTED NOT DETECTED Final   Norovirus GI/GII NOT DETECTED NOT DETECTED Final   Rotavirus A NOT DETECTED NOT DETECTED Final   Sapovirus (I, II, IV, and V) NOT DETECTED NOT DETECTED Final    Comment: Performed at [facility], [address] [place], [place] [address]  C Difficile Quick Screen w PCR reflex     Status: None   Collection Time: 05/14/19  5:41 PM   Specimen: Stool  Result Value Ref Range Status   C Diff antigen NEGATIVE NEGATIVE Final   C Diff toxin NEGATIVE NEGATIVE Final   C Diff interpretation No C. difficile detected.  Final    Comment: Performed at [facility], [place] [address], [place], [place] [address]  Culture, blood (routine x 2)     Status: None (Preliminary result)   Collection Time: 05/15/19  6:09 PM   Specimen: BLOOD  Result Value Ref Range Status   Specimen Description BLOOD RIGHT ANTECUBITAL  Final   Special Requests   Final    BOTTLES DRAWN AEROBIC AND ANAEROBIC Blood Culture adequate volume   Culture   Final    NO GROWTH 2 DAYS Performed at [facility], [place] [address], [place], [place] [address]    Report Status PENDING  Incomplete  Culture, blood (routine x 2)     Status: None (Preliminary result)   Collection Time: 05/15/19  6:14 PM   Specimen: BLOOD  Result Value Ref Range Status   Specimen Description BLOOD LEFT ANTECUBITAL  Final   Special Requests   Final    BOTTLES DRAWN AEROBIC ONLY Blood Culture adequate volume    Culture   Final    NO GROWTH 2 DAYS Performed at [facility], [place] [address] [place], [place] [address]    Report Status PENDING  Incomplete     Labs: Basic Metabolic Panel: Recent Labs  Lab 05/13/19 1135 05/13/19 1135 05/13/19 1716 05/13/19 2300 05/14/19 0506 05/15/19 0629 05/16/19 0423 05/17/19 0741  NA 133*  --   --   --  137 136 139 139  K 2.6*   < >  --  3.0* 3.2* 2.5* 2.7* 3.6  CL 90*  --   --   --  100 100 100 102  CO2 28  --   --   --  26 27 28 26   GLUCOSE 140*  --   --   --  102* 106* 112* 110*  BUN 22  --   --   --  16  7* 6* 6*  CREATININE 1.40*  --   --   --  0.92 0.74 0.75 0.74  CALCIUM 9.5  --   --   --  8.7* 8.2* 8.8* 9.0  MG  --   --  1.8  --   --  1.8 1.8 1.6*   < > = values in this interval not displayed.   Liver Function Tests: Recent Labs  Lab 05/15/19 1153 05/16/19 0423  AST 29 24  ALT 47* 39  ALKPHOS 110 110  BILITOT 0.5 0.4  PROT 5.9* 5.8*  ALBUMIN 3.0* 3.0*   No results for input(s): LIPASE, AMYLASE in the last 168 hours. No results for input(s): AMMONIA in the last 168 hours. CBC: Recent Labs  Lab 05/13/19 1135 05/15/19 0629 05/15/19 1153 05/16/19 0423 05/17/19 0741  WBC 3.7* 14.9* 16.6* 17.5* 13.1*  NEUTROABS  --  8.8*  --  13.3* 6.6  HGB 13.1 9.2* 9.6* 9.3* 9.8*  HCT 38.3 27.2* 29.2* 28.4* 30.4*  MCV 97.0 101.1* 99.7 100.7* 103.1*  PLT 152 123* 127* 129* 144*   Cardiac Enzymes: Recent Labs  Lab 05/13/19 1716  CKTOTAL 137   BNP: BNP (last 3 results) No results for input(s): BNP in the last 8760 hours.  ProBNP (last 3 results) No results for input(s): PROBNP in the last 8760 hours.  CBG: Recent Labs  Lab 05/13/19 1503 05/14/19 0711 05/15/19 0645 05/16/19 0638 05/17/19 0648  GLUCAP 120* 108* 97 100* 78       Signed:  Florencia Reasons MD, PhD, FACP  [facility] 05/17/2019, 10:41 AM

## 2019-05-17 NOTE — Progress Notes (Signed)
Patient is discharge home on home health. Discharge instruction giving to patient, medications and appointment reviewed. Patient verbalizes understanding.

## 2019-05-20 LAB — CULTURE, BLOOD (ROUTINE X 2)
Culture: NO GROWTH
Culture: NO GROWTH
Special Requests: ADEQUATE
Special Requests: ADEQUATE

## 2019-05-26 ENCOUNTER — Ambulatory Visit (HOSPITAL_COMMUNITY): Payer: Medicare Other | Admitting: Licensed Clinical Social Worker

## 2019-07-06 ENCOUNTER — Telehealth (INDEPENDENT_AMBULATORY_CARE_PROVIDER_SITE_OTHER): Payer: Medicare Other | Admitting: Psychiatry

## 2019-07-06 ENCOUNTER — Other Ambulatory Visit: Payer: Self-pay

## 2019-07-06 DIAGNOSIS — F411 Generalized anxiety disorder: Secondary | ICD-10-CM

## 2019-07-06 DIAGNOSIS — F330 Major depressive disorder, recurrent, mild: Secondary | ICD-10-CM | POA: Diagnosis not present

## 2019-07-06 DIAGNOSIS — M797 Fibromyalgia: Secondary | ICD-10-CM

## 2019-07-06 MED ORDER — LORAZEPAM 0.5 MG PO TABS: 0.5000 mg | ORAL_TABLET | Freq: Two times a day (BID) | ORAL | 2 refills | Status: DC | PRN

## 2019-07-06 MED ORDER — ARIPIPRAZOLE 2 MG PO TABS: 2.0000 mg | ORAL_TABLET | Freq: Every day | ORAL | 2 refills | Status: DC

## 2019-07-06 MED ORDER — DULOXETINE HCL 60 MG PO CPEP: 60.0000 mg | ORAL_CAPSULE | Freq: Two times a day (BID) | ORAL | 0 refills | Status: DC

## 2019-07-06 NOTE — Progress Notes (Signed)
[place] MD/PA/NP OP Progress Note  07/06/2019 10:08 AM Rebekah [name]  MRN:  [id number] Interview was conducted by phone and I verified that I was speaking with the correct person using two identifiers. I discussed the limitations of evaluation and management by telemedicine and  the availability of in person appointments. Patient expressed understanding and agreed to proceed. Patient location - home; physician - home office.  Chief Complaint: Anxiety.  HPI: 64 yo separated Latina with a long history of depression and anxiety.She states thatfirst time she experienced depression was [phone] after her mother died.Then inthe 80s she lost her first husband because he drank a lot. 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 She is receiving treatment from [facility]: completed5 immunotherapy treatments which had to be stopped due to developing colitis. She restarted chemotherapy (had it also in 2019). Medications for depression/anxiety have been prescribed by her oncologist but she has not been consistent with taking some of them. Dr. Harrington Challenger continued duloxetine 60 mg bid and added aripiprazole 2 mg at HS and lorazepam 0.5 mg at HS for sleep/anxiety (I changed to tid prn but she uses it rarely during the day). Rebekah [name] reports that she has on and off problems with intial insomnia and feels anxious most of the time. Her husband of 4 years abruptly left her in 2017 and went to [place]. They are still in touch with each other. This situation contributes to low grade depression.   Visit Diagnosis:    ICD-10-CM   1. Generalized anxiety disorder  F41.1   2. Fibromyalgia  M79.7 DULoxetine (CYMBALTA) 60 MG capsule  3. Mild episode of recurrent major depressive disorder (HCC)  F33.0 DULoxetine (CYMBALTA) 60 MG  capsule    Past Psychiatric History: Please see intake H&P.  Past Medical History:  Past Medical History:  Diagnosis Date  . Anxiety   . Arthritis   . Asthma    NO INHALERS USED  . Cancer ([place])    ENDOMETRIAL  . Depression   . Family history of breast cancer   . Family history of colon cancer   . Fibromyalgia   . GERD (gastroesophageal reflux disease)   . Insomnia   . Seizure disorder ([place])    LAST SEIZURE FEBRUARY 2019 SEES DR [facility]   . Seizures ([place])     Past Surgical History:  Procedure Laterality Date  . ABDOMINAL HYSTERECTOMY    . BREAST BIOPSY Right    biopsy in the 1990's  . BUNIONECTOMY Bilateral    2000's - R ONCE and L once  . CARPAL TUNNEL RELEASE Bilateral   . COLONSCOPY    . ENDOMETRIAL BIOPSY  02/2017  . IR FLUORO GUIDE PORT INSERTION RIGHT  04/24/2017  . IR REMOVAL TUN ACCESS W/ PORT W/O FL MOD SED  10/07/2017  . IR US GUIDE VASC ACCESS RIGHT  04/24/2017  . ROBOTIC ASSISTED TOTAL HYSTERECTOMY WITH BILATERAL SALPINGO OOPHERECTOMY Bilateral 03/26/2017   Procedure: XI ROBOTIC ASSISTED TOTAL HYSTERECTOMY WITH BILATERAL SALPINGO OOPHORECTOMY AND SENTINAL LYMPH NODES;  Surgeon: Everitt Amber, MD;  Location: [facility];  Service: Gynecology;  Laterality: Bilateral;  . TUBAL LIGATION  1984  . TUBAL LIGATION      Family Psychiatric History: None.  Family History:  Family History  Problem Relation Age of Onset  . Breast cancer Mother 54  . Diabetes Brother   . Hypertension Brother   .  Stroke Brother   . Breast cancer Maternal Grandmother        dx >50  . Breast cancer Maternal Aunt        dx <50  . Colon cancer Paternal Aunt   . Cancer Paternal Uncle        type unk  . Breast cancer Maternal Aunt        dx >50  . Cancer Paternal Aunt   . Cancer Paternal Uncle     Social History:  Social History   Socioeconomic History  . Marital status: Married    Spouse name: Not on file  . Number of children: 4  . Years of education: 8  .  Highest education level: Not on file  Occupational History  . 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 Smoking status: Never Smoker  . Smokeless tobacco: Never Used  Vaping Use  . Vaping Use: Never used  Substance and Sexual Activity  . Alcohol use: No    Alcohol/week: 0.0 standard drinks  . Drug use: No  . Sexual activity: Not Currently    Partners: Male    Birth control/protection: Post-menopausal  Other Topics Concern  . Not on file  Social History Narrative   Lives with daughter, Rebekah [name] "Nicole Kindred"   Caffeine use: coffee/tea/soda daily   Social Determinants of Health   Financial Resource Strain:   . Difficulty of Paying Living Expenses:   Food Insecurity:   . Worried About Charity fundraiser in the Last Year:   . Arboriculturist in the Last Year:   Transportation Needs:   . Film/video editor (Medical):   Marland Kitchen Lack of Transportation (Non-Medical):   Physical Activity:   . Days of Exercise per Week:   . Minutes of Exercise per Session:   Stress:   . Feeling of Stress :   Social Connections:   . Frequency of Communication with Friends and Family:   . Frequency of Social Gatherings with Friends and Family:   . Attends Religious Services:   . Active Member of Clubs or Organizations:   . Attends Archivist Meetings:   Marland Kitchen Marital Status:     Allergies:  Allergies  Allergen Reactions  . Ambien [Zolpidem Tartrate] Other (See Comments)    Other reaction(s): Hallucination  . Soma [Carisoprodol] Other (See Comments)    chills    Metabolic Disorder Labs: Lab Results  Component Value Date   HGBA1C 5.9 (H) 08/24/2014   MPG 123 (H) 08/24/2014   No results found for: PROLACTIN Lab Results  Component Value Date   CHOL 193 08/24/2014   TRIG 110 08/24/2014   HDL 58 08/24/2014   CHOLHDL 3.3 08/24/2014   VLDL 22 08/24/2014   LDLCALC 113 08/24/2014   Lab Results  Component Value Date   TSH 1.030 11/07/2016   TSH 1.12 09/21/2015    Therapeutic Level  Labs: No results found for: LITHIUM No results found for: VALPROATE No components found for:  CBMZ  Current Medications: Current Outpatient Medications  Medication Sig Dispense Refill  . [START ON 07/20/2019] ARIPiprazole (ABILIFY) 2 MG tablet Take 1 tablet (2 mg total) by mouth at bedtime. 30 tablet 2  . cholestyramine light (PREVALITE) 4 g packet Take 1 packet (4 g total) by mouth 2 (two) times daily. 42 each 0  . [START ON 07/20/2019] DULoxetine (CYMBALTA) 60 MG capsule Take 1 capsule (60 mg total) by mouth 2 (two) times daily. 469 capsule 0  . folic  acid (FOLVITE) 1 MG tablet Take 1 mg by mouth daily.     . hydrOXYzine (ATARAX/VISTARIL) 25 MG tablet Take 50 mg by mouth at bedtime as needed for anxiety.    . lamoTRIgine (LAMICTAL) 100 MG tablet Take 100 mg by mouth See admin instructions. 150mg  in the morning, 100mg  at night.    . levETIRAcetam (KEPPRA) 1000 MG tablet Take 1,500 mg by mouth 2 (two) times daily.     Marland Kitchen loperamide (IMODIUM) 2 MG capsule Take 1 capsule (2 mg total) by mouth every 6 (six) hours as needed for diarrhea or loose stools. 30 capsule 0  . [START ON 07/20/2019] LORazepam (ATIVAN) 0.5 MG tablet Take 1 tablet (0.5 mg total) by mouth 2 (two) times daily as needed for anxiety or sleep. 60 tablet 2  . Melatonin 12 MG TABS Take 1 tablet by mouth as needed (sleep).    . ondansetron (ZOFRAN-ODT) 4 MG disintegrating tablet Take 4 mg by mouth every 8 (eight) hours as needed for nausea or vomiting.    . potassium chloride (KLOR-CON) 10 MEQ tablet Take 20 mEq by mouth daily.    . prochlorperazine (COMPAZINE) 10 MG tablet Take 10 mg by mouth every 6 (six) hours as needed for nausea.    . ranitidine (ZANTAC) 150 MG capsule Take 1 capsule (150 mg total) by mouth 2 (two) times daily. (Patient not taking: Reported on 05/13/2019) 60 capsule 1   No current facility-administered medications for this visit.     Psychiatric Specialty Exam: Review of Systems  Psychiatric/Behavioral: The  patient is nervous/anxious.   All other systems reviewed and are negative.   There were no vitals taken for this visit.There is no height or weight on file to calculate BMI.  General Appearance: NA  Eye Contact:  NA  Speech:  Clear and Coherent and Normal Rate  Volume:  Normal  Mood:  Anxious  Affect:  NA  Thought Process:  Goal Directed and Linear  Orientation:  Full (Time, Place, and Person)  Thought Content: Logical   Suicidal Thoughts:  No  Homicidal Thoughts:  No  Memory:  Immediate;   Good Recent;   Good Remote;   Good  Judgement:  Good  Insight:  Good  Psychomotor Activity:  NA  Concentration:  Concentration: Good  Recall:  Good  Fund of Knowledge: Good  Language: Good  Akathisia:  Negative  Handed:  Right  AIMS (if indicated): Not done  Assets:  Communication Skills Desire for Improvement Financial Resources/Insurance Housing Resilience  ADL's:  Intact  Cognition: WNL  Sleep:  Fair   Screenings: PHQ2-9     Telemedicine from 05/29/2018 in Primary Care at [place] from 03/13/2018 in [place] at [place] from 01/03/2018 in Primary Care at [place] from 11/29/2017 in Primary Care at [place] from 10/03/2017 in Primary Care at [facility] Total Score 5 4 0 0 4  PHQ-9 Total Score 13 15 -- -- 13       Assessment and Plan:  64 yo separated Latina with a long history of depression and anxiety.She states thatfirst time she experienced depression was [phone] after her mother died.Then inthe 80s she lost her first husband because he drank a lot. 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 She is receiving treatment from [facility]: completed5 immunotherapy treatments which had to be  stopped due to developing colitis. She  restarted chemotherapy (had it also in 2019). Medications for depression/anxiety have been prescribed by her oncologist but she has not been consistent with taking some of them. Dr. Harrington Challenger continued duloxetine 60 mg bid and added aripiprazole 2 mg at HS and lorazepam 0.5 mg at HS for sleep/anxiety (I changed to tid prn but she uses it rarely during the day). Maryuri reports that she has on and off problems with intial insomnia and feels anxious most of the time. Her husband of 4 years abruptly left her in 2017 and went to [place]. They are still in touch with each other. This situation contributes to low grade depression.  Dx: GAD; MDD recurrent mild  Plan: Continue duloxetine 60 mg bid, aripiprazole 2 mg at HS and lorazepam to 0.5 mg bid prn anxiety/sleep. Next visit in two month. The plan was discussed with patient who had an opportunity to ask questions and these were all answered. I spend15 minutes inphone consultation with the patient.    Stephanie Acre, MD 07/06/2019, 10:08 AM

## 2019-09-07 ENCOUNTER — Telehealth (INDEPENDENT_AMBULATORY_CARE_PROVIDER_SITE_OTHER): Payer: Medicare Other | Admitting: Psychiatry

## 2019-09-07 ENCOUNTER — Other Ambulatory Visit: Payer: Self-pay

## 2019-09-07 DIAGNOSIS — M797 Fibromyalgia: Secondary | ICD-10-CM | POA: Diagnosis not present

## 2019-09-07 DIAGNOSIS — F330 Major depressive disorder, recurrent, mild: Secondary | ICD-10-CM | POA: Diagnosis not present

## 2019-09-07 DIAGNOSIS — F411 Generalized anxiety disorder: Secondary | ICD-10-CM | POA: Diagnosis not present

## 2019-09-07 MED ORDER — LORAZEPAM 1 MG PO TABS: 1.0000 mg | ORAL_TABLET | Freq: Two times a day (BID) | ORAL | 2 refills | Status: DC | PRN

## 2019-09-07 MED ORDER — ARIPIPRAZOLE 2 MG PO TABS: 2.0000 mg | ORAL_TABLET | Freq: Every day | ORAL | 0 refills | Status: DC

## 2019-09-07 MED ORDER — DULOXETINE HCL 60 MG PO CPEP: 60.0000 mg | ORAL_CAPSULE | Freq: Two times a day (BID) | ORAL | 0 refills | Status: DC

## 2019-09-07 NOTE — Progress Notes (Signed)
[place] MD/PA/NP OP Progress Note  09/07/2019 10:09 AM Rebekah [name]  MRN:  [id number] Interview was conducted by phone and I verified that I was speaking with the correct person using two identifiers. I discussed the limitations of evaluation and management by telemedicine and  the availability of in person appointments. Patient expressed understanding and agreed to proceed. Patient location - home; physician - home office.  Chief Complaint: Increased anxiety.  HPI: 64 yo separated Latina with a long history of depression and anxiety.She states thatfirst time she experienced depression was [phone] after her mother died.Then inthe 80s she lost her first husband because he drank a lot. 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 She is receiving treatment from [facility]: completed5 immunotherapy treatments which had to be stopped due to developing colitis. She restarted chemotherapy (had it also in 2019). Medications for depression/anxiety have been prescribed by her oncologist but she has not been consistent with taking some of them. Dr. Harrington Challenger continued duloxetine 60 mg bid and added aripiprazole 2 mg at HS and lorazepam 0.5 mg at HS for sleep/anxiety(I changed to tid prn but she uses it rarely during the day). Rebekah [name] reports that she has on and off problems with intial insomnia and feels anxious most of the time. Her husband of 4 years abruptly left her in 2017 and went to [place]. They are still in touch with each other. This situation contributes to low grade depression. She has five brothers in [place] and one of them is in a hospital with COVID pneumonia (on vent). She is very anxious about his prognosis. Rebekah [name] is vaccinated against COVID.  Visit Diagnosis:    ICD-10-CM   1. Generalized anxiety  disorder  F41.1   2. Fibromyalgia  M79.7 DULoxetine (CYMBALTA) 60 MG capsule  3. Mild episode of recurrent major depressive disorder (HCC)  F33.0 DULoxetine (CYMBALTA) 60 MG capsule    Past Psychiatric History: Please see intake H&P.  Past Medical History:  Past Medical History:  Diagnosis Date  . Anxiety   . Arthritis   . Asthma    NO INHALERS USED  . Cancer ([place])    ENDOMETRIAL  . Depression   . Family history of breast cancer   . Family history of colon cancer   . Fibromyalgia   . GERD (gastroesophageal reflux disease)   . Insomnia   . Seizure disorder ([place])    LAST SEIZURE FEBRUARY 2019 SEES DR [facility]   . Seizures ([place])     Past Surgical History:  Procedure Laterality Date  . ABDOMINAL HYSTERECTOMY    . BREAST BIOPSY Right    biopsy in the 1990's  . BUNIONECTOMY Bilateral    2000's - R ONCE and L once  . CARPAL TUNNEL RELEASE Bilateral   . COLONSCOPY    . ENDOMETRIAL BIOPSY  02/2017  . IR FLUORO GUIDE PORT INSERTION RIGHT  04/24/2017  . IR REMOVAL TUN ACCESS W/ PORT W/O FL MOD SED  10/07/2017  . IR US GUIDE VASC ACCESS RIGHT  04/24/2017  . ROBOTIC ASSISTED TOTAL HYSTERECTOMY WITH BILATERAL SALPINGO OOPHERECTOMY Bilateral 03/26/2017   Procedure: XI ROBOTIC ASSISTED TOTAL HYSTERECTOMY WITH BILATERAL SALPINGO OOPHORECTOMY AND SENTINAL LYMPH NODES;  Surgeon: Everitt Amber, MD;  Location: [facility];  Service: Gynecology;  Laterality: Bilateral;  . TUBAL LIGATION  1984  . TUBAL LIGATION      Family  Psychiatric History: None.  Family History:  Family History  Problem Relation Age of Onset  . Breast cancer Mother 28  . Diabetes Brother   . Hypertension Brother   . Stroke Brother   . Breast cancer Maternal Grandmother        dx >50  . Breast cancer Maternal Aunt        dx <50  . Colon cancer Paternal Aunt   . Cancer Paternal Uncle        type unk  . Breast cancer Maternal Aunt        dx >50  . Cancer Paternal Aunt   . Cancer Paternal Uncle      Social History:  Social History   Socioeconomic History  . Marital status: Married    Spouse name: Not on file  . Number of children: 4  . Years of education: 8  . Highest education level: Not on file  Occupational History  . 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 Smoking status: Never Smoker  . Smokeless tobacco: Never Used  Vaping Use  . Vaping Use: Never used  Substance and Sexual Activity  . Alcohol use: No    Alcohol/week: 0.0 standard drinks  . Drug use: No  . Sexual activity: Not Currently    Partners: Male    Birth control/protection: Post-menopausal  Other Topics Concern  . Not on file  Social History Narrative   Lives with daughter, Rebekah [name] "Nicole Kindred"   Caffeine use: coffee/tea/soda daily   Social Determinants of Health   Financial Resource Strain:   . Difficulty of Paying Living Expenses: Not on file  Food Insecurity:   . Worried About Charity fundraiser in the Last Year: Not on file  . Ran Out of Food in the Last Year: Not on file  Transportation Needs:   . Lack of Transportation (Medical): Not on file  . Lack of Transportation (Non-Medical): Not on file  Physical Activity:   . Days of Exercise per Week: Not on file  . Minutes of Exercise per Session: Not on file  Stress:   . Feeling of Stress : Not on file  Social Connections:   . Frequency of Communication with Friends and Family: Not on file  . Frequency of Social Gatherings with Friends and Family: Not on file  . Attends Religious Services: Not on file  . Active Member of Clubs or Organizations: Not on file  . Attends Archivist Meetings: Not on file  . Marital Status: Not on file    Allergies:  Allergies  Allergen Reactions  . Ambien [Zolpidem Tartrate] Other (See Comments)    Other reaction(s): Hallucination  . Soma [Carisoprodol] Other (See Comments)    chills    Metabolic Disorder Labs: Lab Results  Component Value Date   HGBA1C 5.9 (H) 08/24/2014   MPG 123 (H)  08/24/2014   No results found for: PROLACTIN Lab Results  Component Value Date   CHOL 193 08/24/2014   TRIG 110 08/24/2014   HDL 58 08/24/2014   CHOLHDL 3.3 08/24/2014   VLDL 22 08/24/2014   LDLCALC 113 08/24/2014   Lab Results  Component Value Date   TSH 1.030 11/07/2016   TSH 1.12 09/21/2015    Therapeutic Level Labs: No results found for: LITHIUM No results found for: VALPROATE No components found for:  CBMZ  Current Medications: Current Outpatient Medications  Medication Sig Dispense Refill  . [START ON 10/17/2019] ARIPiprazole (ABILIFY) 2 MG tablet Take 1 tablet (  2 mg total) by mouth at bedtime. 90 tablet 0  . cholestyramine light (PREVALITE) 4 g packet Take 1 packet (4 g total) by mouth 2 (two) times daily. 42 each 0  . [START ON 10/17/2019] DULoxetine (CYMBALTA) 60 MG capsule Take 1 capsule (60 mg total) by mouth 2 (two) times daily. 300 capsule 0  . folic acid (FOLVITE) 1 MG tablet Take 1 mg by mouth daily.     . hydrOXYzine (ATARAX/VISTARIL) 25 MG tablet Take 50 mg by mouth at bedtime as needed for anxiety.    . lamoTRIgine (LAMICTAL) 100 MG tablet Take 100 mg by mouth See admin instructions. 150mg  in the morning, 100mg  at night.    . levETIRAcetam (KEPPRA) 1000 MG tablet Take 1,500 mg by mouth 2 (two) times daily.     Marland Kitchen loperamide (IMODIUM) 2 MG capsule Take 1 capsule (2 mg total) by mouth every 6 (six) hours as needed for diarrhea or loose stools. 30 capsule 0  . LORazepam (ATIVAN) 1 MG tablet Take 1 tablet (1 mg total) by mouth 2 (two) times daily as needed for anxiety or sleep. 60 tablet 2  . Melatonin 12 MG TABS Take 1 tablet by mouth as needed (sleep).    . ondansetron (ZOFRAN-ODT) 4 MG disintegrating tablet Take 4 mg by mouth every 8 (eight) hours as needed for nausea or vomiting.    . potassium chloride (KLOR-CON) 10 MEQ tablet Take 20 mEq by mouth daily.    . prochlorperazine (COMPAZINE) 10 MG tablet Take 10 mg by mouth every 6 (six) hours as needed for nausea.     . ranitidine (ZANTAC) 150 MG capsule Take 1 capsule (150 mg total) by mouth 2 (two) times daily. (Patient not taking: Reported on 05/13/2019) 60 capsule 1   No current facility-administered medications for this visit.     Psychiatric Specialty Exam: Review of Systems  Psychiatric/Behavioral: The patient is nervous/anxious.   All other systems reviewed and are negative.   There were no vitals taken for this visit.There is no height or weight on file to calculate BMI.  General Appearance: NA  Eye Contact:  NA  Speech:  Clear and Coherent and Normal Rate  Volume:  Normal  Mood:  Anxious  Affect:  NA  Thought Process:  Goal Directed and Linear  Orientation:  Full (Time, Place, and Person)  Thought Content: Logical   Suicidal Thoughts:  No  Homicidal Thoughts:  No  Memory:  Immediate;   Good Recent;   Good Remote;   Good  Judgement:  Good  Insight:  Good  Psychomotor Activity:  NA  Concentration:  Concentration: Good  Recall:  Good  Fund of Knowledge: Good  Language: Good  Akathisia:  Negative  Handed:  Right  AIMS (if indicated): not done  Assets:  Communication Skills Desire for Improvement Financial Resources/Insurance Housing Resilience  ADL's:  Intact  Cognition: WNL  Sleep:  Fair   Screenings: PHQ2-9     Telemedicine from 05/29/2018 in Primary Care at [place] from 03/13/2018 in Primary Care at [place] from 01/03/2018 in Primary Care at [place] from 11/29/2017 in Primary Care at [place] from 10/03/2017 in Primary Care at [facility] Total Score 5 4 0 0 4  PHQ-9 Total Score 13 15 -- -- 13       Assessment and Plan:  64 yo separated Latina with a long history of depression and anxiety.She states thatfirst time she experienced depression was [phone]  after her mother died.Then inthe 80s she lost her first husband because he drank a lot. 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 She is receiving treatment from [facility]: completed5 immunotherapy treatments which had to be stopped due to developing colitis. She restarted chemotherapy (had it also in 2019). Medications for depression/anxiety have been prescribed by her oncologist but she has not been consistent with taking some of them. Dr. Harrington Challenger continued duloxetine 60 mg bid and added aripiprazole 2 mg at HS and lorazepam 0.5 mg at HS for sleep/anxiety(I changed to tid prn but she uses it rarely during the day). Thamara reports that she has on and off problems with intial insomnia and feels anxious most of the time. Her husband of 4 years abruptly left her in 2017 and went to [place]. They are still in touch with each other. This situation contributes to low grade depression. She has five brothers in [place] and one of them is in a hospital with COVID pneumonia (on vent). She is very anxious about his prognosis. [place] is vaccinated against COVID.  Dx: GAD; MDD recurrentmild  Plan: Continue duloxetine 60 mg bid, aripiprazole 2 mg at HS andincrease lorazepam to 1 mg bid prn anxiety/sleep. Next visit intwomonth. The plan was discussed with patient who had an opportunity to ask questions and these were all answered. I spend46minutes inphone consultationwith the patient.    Stephanie Acre, MD 09/07/2019, 10:09 AM

## 2019-09-30 ENCOUNTER — Other Ambulatory Visit: Payer: Medicare Other

## 2019-09-30 ENCOUNTER — Other Ambulatory Visit: Payer: Self-pay

## 2019-09-30 DIAGNOSIS — Z20822 Contact with and (suspected) exposure to covid-19: Secondary | ICD-10-CM

## 2019-10-03 LAB — NOVEL CORONAVIRUS, NAA: SARS-CoV-2, NAA: NOT DETECTED

## 2019-10-20 ENCOUNTER — Ambulatory Visit: Payer: Medicare Other

## 2019-10-23 IMAGING — XA IR FLUORO GUIDE CV LINE*R*
1 series · 1 of 1 positions shown · non-contrast
Comparison: none

INDICATION: 61-year-old female with endometrial cancer in need of durable venous
access for chemotherapy.

[Series 1: fl (-) angio · 1 of 1 slices shown]
[im 1/1]
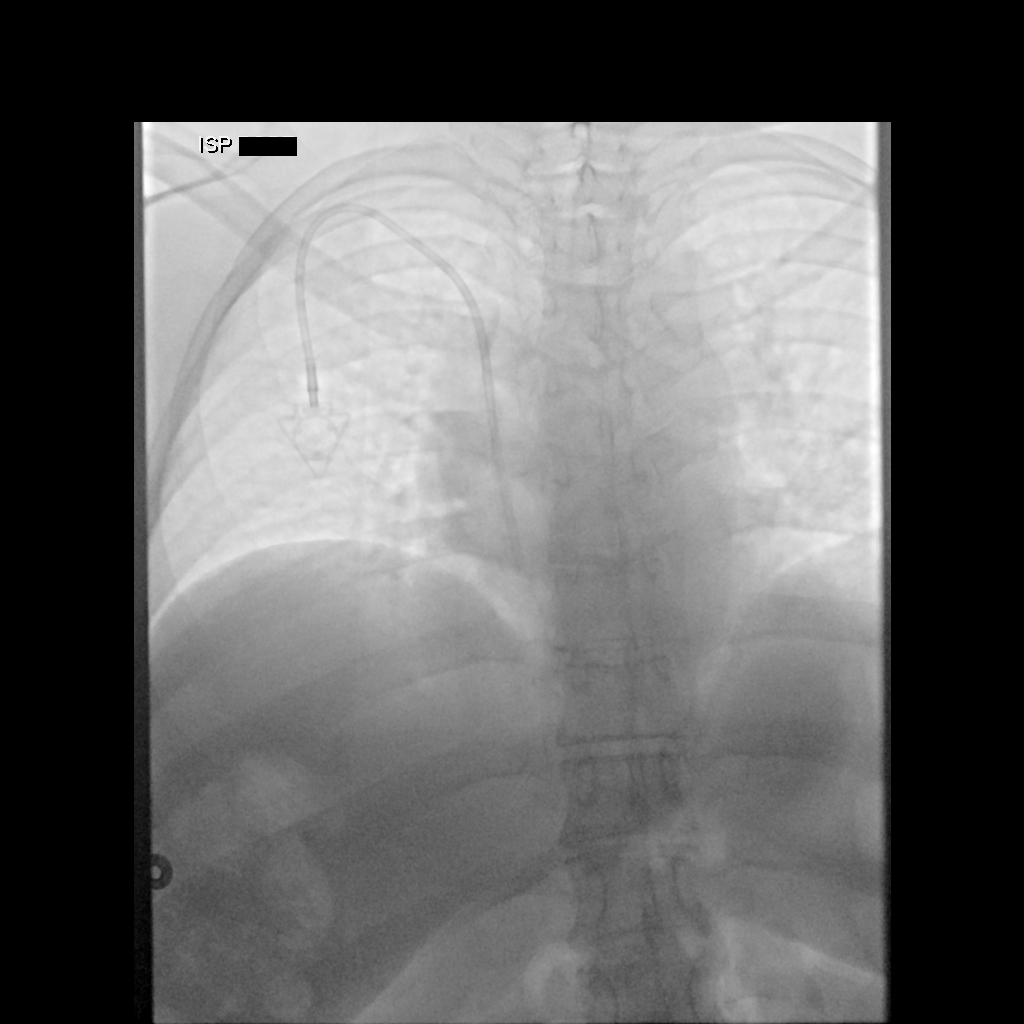

[1 of 1 positions shown; findings below may reference images not displayed]

EXAM:
IMPLANTED PORT A CATH PLACEMENT WITH ULTRASOUND AND FLUOROSCOPIC
GUIDANCE

MEDICATIONS:
2 g Ancef; The antibiotic was administered within an appropriate
time interval prior to skin puncture.

ANESTHESIA/SEDATION:
Versed 2 mg IV; Fentanyl 100 mcg IV;

Moderate Sedation Time:  19 minutes

The patient was continuously monitored during the procedure by the
interventional radiology nurse under my direct supervision.

FLUOROSCOPY TIME:  0 minutes, 6 seconds (1 mGy)

COMPLICATIONS:
None immediate.

PROCEDURE:
The right neck and chest was prepped with chlorhexidine, and draped
in the usual sterile fashion using maximum barrier technique (cap
and mask, sterile gown, sterile gloves, large sterile sheet, hand
hygiene and cutaneous antiseptic). Antibiotic prophylaxis was
provided with 2g Ancef administered IV one hour prior to skin
incision. Local anesthesia was attained by infiltration with 1%
lidocaine with epinephrine.

Ultrasound demonstrated patency of the right internal jugular vein,
and this was documented with an image. Under real-time ultrasound
guidance, this vein was accessed with a 21 gauge micropuncture
needle and image documentation was performed. A small dermatotomy
was made at the access site with an 11 scalpel. A 0.018" wire was
advanced into the SVC and the access needle exchanged for a 4F
micropuncture vascular sheath. The 0.018" wire was then removed and
a 0.035" wire advanced into the IVC.



The venous access site was then serially dilated and a peel away
vascular sheath placed over the wire. The wire was removed and the
port catheter advanced into position under fluoroscopic guidance.
The catheter tip is positioned in the cavoatrial junction. This was
documented with a spot image. The portacatheter was then tested and
found to flush and aspirate well. The port was flushed with saline
followed by 100 units/mL heparinized saline.

The pocket was then closed in two layers using first subdermal
inverted interrupted absorbable sutures followed by a running
subcuticular suture. The epidermis was then sealed with Dermabond.
The dermatotomy at the venous access site was also sealed with
Dermabond.
IMPRESSION: Successful placement of a right IJ approach Power Port with
ultrasound and fluoroscopic guidance. The catheter is ready for use.

## 2019-11-09 ENCOUNTER — Other Ambulatory Visit: Payer: Self-pay

## 2019-11-09 ENCOUNTER — Telehealth (INDEPENDENT_AMBULATORY_CARE_PROVIDER_SITE_OTHER): Payer: Medicare Other | Admitting: Psychiatry

## 2019-11-09 DIAGNOSIS — F330 Major depressive disorder, recurrent, mild: Secondary | ICD-10-CM | POA: Diagnosis not present

## 2019-11-09 DIAGNOSIS — F411 Generalized anxiety disorder: Secondary | ICD-10-CM

## 2019-11-09 MED ORDER — LORAZEPAM 1 MG PO TABS: 1.0000 mg | ORAL_TABLET | Freq: Two times a day (BID) | ORAL | 2 refills | Status: DC | PRN

## 2019-11-09 NOTE — Progress Notes (Signed)
[place] MD/PA/NP OP Progress Note  11/09/2019 10:08 AM Rebekah [name]  MRN:  [id number] Interview was conducted by phone and I verified that I was speaking with the correct person using two identifiers. I discussed the limitations of evaluation and management by telemedicine and  the availability of in person appointments. Patient expressed understanding and agreed to proceed. Patient location - home; physician - home office.  Chief Complaint: Some anxiety.  HPI: 64 yo separated Latina with a long history of depression and anxiety.She states thatfirst time she experienced depression was [phone] after her mother died.Then inthe 80s she lost her first husband because he drank a lot. 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 She is receiving treatment from [facility]. Recently started on supplemental oxygen which she feels she "does not need". Medications for depression/anxiety have been prescribed by her oncologist but she has not been consistent with taking some of them. SAhe is on duloxetine 60 mg bid, aripiprazole 2 mg at HS and lorazepam 1 mg at HS for sleep/anxiety(I changed to bid prn but she uses it rarely during the day). Lashe reports that she has on and off problems with intial insomnia and feels anxious most of the time. Her husband of 4 years abruptly left her in 2017 and went to [place]. They are still in touch with each other.This situation contributes to low grade depression. She has five brothers in [place] and one of them passed away from [place] pneumonia in late August. She went to [place] for a funeral and says meeting her family was a good experience despite the sadness of the event.     Visit Diagnosis:    ICD-10-CM   1. Mild episode of recurrent major depressive disorder ([place])   F33.0   2. Generalized anxiety disorder  F41.1     Past Psychiatric History: Please see intake H&P.  Past Medical History:  Past Medical History:  Diagnosis Date  . Anxiety   . Arthritis   . Asthma    NO INHALERS USED  . Cancer ([place])    ENDOMETRIAL  . Depression   . Family history of breast cancer   . Family history of colon cancer   . Fibromyalgia   . GERD (gastroesophageal reflux disease)   . Insomnia   . Seizure disorder ([place])    LAST SEIZURE FEBRUARY 2019 SEES DR [facility]   . Seizures ([place])     Past Surgical History:  Procedure Laterality Date  . ABDOMINAL HYSTERECTOMY    . BREAST BIOPSY Right    biopsy in the 1990's  . BUNIONECTOMY Bilateral    2000's - R ONCE and L once  . CARPAL TUNNEL RELEASE Bilateral   . COLONSCOPY    . ENDOMETRIAL BIOPSY  02/2017  . IR FLUORO GUIDE PORT INSERTION RIGHT  04/24/2017  . IR REMOVAL TUN ACCESS W/ PORT W/O FL MOD SED  10/07/2017  . IR US GUIDE VASC ACCESS RIGHT  04/24/2017  . ROBOTIC ASSISTED TOTAL HYSTERECTOMY WITH BILATERAL SALPINGO OOPHERECTOMY Bilateral 03/26/2017   Procedure: XI ROBOTIC ASSISTED TOTAL HYSTERECTOMY WITH BILATERAL SALPINGO OOPHORECTOMY AND SENTINAL LYMPH NODES;  Surgeon: Everitt Amber, MD;  Location: [facility];  Service: Gynecology;  Laterality: Bilateral;  . TUBAL LIGATION  1984  . TUBAL LIGATION      Family Psychiatric History: None.  Family History:  Family History  Problem Relation Age  of Onset  . Breast cancer Mother 36  . Diabetes Brother   . Hypertension Brother   . Stroke Brother   . Breast cancer Maternal Grandmother        dx >50  . Breast cancer Maternal Aunt        dx <50  . Colon cancer Paternal Aunt   . Cancer Paternal Uncle        type unk  . Breast cancer Maternal Aunt        dx >50  . Cancer Paternal Aunt   . Cancer Paternal Uncle     Social History:  Social History   Socioeconomic History  . Marital status: Married    Spouse name: Not on file  . Number of  children: 4  . Years of education: 8  . Highest education level: Not on file  Occupational History  . 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 Smoking status: Never Smoker  . Smokeless tobacco: Never Used  Vaping Use  . Vaping Use: Never used  Substance and Sexual Activity  . Alcohol use: No    Alcohol/week: 0.0 standard drinks  . Drug use: No  . Sexual activity: Not Currently    Partners: Male    Birth control/protection: Post-menopausal  Other Topics Concern  . Not on file  Social History Narrative   Lives with daughter, [name]   Caffeine use: coffee/tea/soda daily   Social Determinants of Health   Financial Resource Strain:   . Difficulty of Paying Living Expenses: Not on file  Food Insecurity:   . Worried About Charity fundraiser in the Last Year: Not on file  . Ran Out of Food in the Last Year: Not on file  Transportation Needs:   . Lack of Transportation (Medical): Not on file  . Lack of Transportation (Non-Medical): Not on file  Physical Activity:   . Days of Exercise per Week: Not on file  . Minutes of Exercise per Session: Not on file  Stress:   . Feeling of Stress : Not on file  Social Connections:   . Frequency of Communication with Friends and Family: Not on file  . Frequency of Social Gatherings with Friends and Family: Not on file  . Attends Religious Services: Not on file  . Active Member of Clubs or Organizations: Not on file  . Attends Archivist Meetings: Not on file  . Marital Status: Not on file    Allergies:  Allergies  Allergen Reactions  . Ambien [Zolpidem Tartrate] Other (See Comments)    Other reaction(s): Hallucination  . Soma [Carisoprodol] Other (See Comments)    chills    Metabolic Disorder Labs: Lab Results  Component Value Date   HGBA1C 5.9 (H) 08/24/2014   MPG 123 (H) 08/24/2014   No results found for: PROLACTIN Lab Results  Component Value Date   CHOL 193 08/24/2014   TRIG 110 08/24/2014   HDL 58  08/24/2014   CHOLHDL 3.3 08/24/2014   VLDL 22 08/24/2014   LDLCALC 113 08/24/2014   Lab Results  Component Value Date   TSH 1.030 11/07/2016   TSH 1.12 09/21/2015    Therapeutic Level Labs: No results found for: LITHIUM No results found for: VALPROATE No components found for:  CBMZ  Current Medications: Current Outpatient Medications  Medication Sig Dispense Refill  . ARIPiprazole (ABILIFY) 2 MG tablet Take 1 tablet (2 mg total) by mouth at bedtime. 90 tablet 0  . cholestyramine light (PREVALITE) 4  g packet Take 1 packet (4 g total) by mouth 2 (two) times daily. 42 each 0  . DULoxetine (CYMBALTA) 60 MG capsule Take 1 capsule (60 mg total) by mouth 2 (two) times daily. 989 capsule 0  . folic acid (FOLVITE) 1 MG tablet Take 1 mg by mouth daily.     . hydrOXYzine (ATARAX/VISTARIL) 25 MG tablet Take 50 mg by mouth at bedtime as needed for anxiety.    . lamoTRIgine (LAMICTAL) 100 MG tablet Take 100 mg by mouth See admin instructions. 150mg  in the morning, 100mg  at night.    . levETIRAcetam (KEPPRA) 1000 MG tablet Take 1,500 mg by mouth 2 (two) times daily.     Marland Kitchen loperamide (IMODIUM) 2 MG capsule Take 1 capsule (2 mg total) by mouth every 6 (six) hours as needed for diarrhea or loose stools. 30 capsule 0  . [START ON 12/07/2019] LORazepam (ATIVAN) 1 MG tablet Take 1 tablet (1 mg total) by mouth 2 (two) times daily as needed for anxiety or sleep. 60 tablet 2  . Melatonin 12 MG TABS Take 1 tablet by mouth as needed (sleep).    . ondansetron (ZOFRAN-ODT) 4 MG disintegrating tablet Take 4 mg by mouth every 8 (eight) hours as needed for nausea or vomiting.    . potassium chloride (KLOR-CON) 10 MEQ tablet Take 20 mEq by mouth daily.    . prochlorperazine (COMPAZINE) 10 MG tablet Take 10 mg by mouth every 6 (six) hours as needed for nausea.    . ranitidine (ZANTAC) 150 MG capsule Take 1 capsule (150 mg total) by mouth 2 (two) times daily. (Patient not taking: Reported on 05/13/2019) 60 capsule 1    No current facility-administered medications for this visit.      Psychiatric Specialty Exam: Review of Systems  Musculoskeletal: Positive for myalgias.  Psychiatric/Behavioral: Positive for sleep disturbance. The patient is nervous/anxious.   All other systems reviewed and are negative.   There were no vitals taken for this visit.There is no height or weight on file to calculate BMI.  General Appearance: NA  Eye Contact:  NA  Speech:  Clear and Coherent and Normal Rate  Volume:  Normal  Mood:  Mild anxiety.  Affect:  NA  Thought Process:  Goal Directed and Linear  Orientation:  Full (Time, Place, and Person)  Thought Content: Logical   Suicidal Thoughts:  No  Homicidal Thoughts:  No  Memory:  Immediate;   Good Recent;   Good Remote;   Good  Judgement:  Good  Insight:  Good  Psychomotor Activity:  NA  Concentration:  Concentration: Good  Recall:  Good  Fund of Knowledge: Good  Language: Good  Akathisia:  Negative  Handed:  Right  AIMS (if indicated): not done  Assets:  Communication Skills Desire for Improvement Financial Resources/Insurance Housing Resilience  ADL's:  Intact  Cognition: WNL  Sleep:  Fair   Screenings: PHQ2-9     Telemedicine from 05/29/2018 in Primary Care at [place] from 03/13/2018 in Primary Care at [place] from 01/03/2018 in Primary Care at [place] from 11/29/2017 in Primary Care at [place] from 10/03/2017 in Primary Care at [facility] Total Score 5 4 0 0 4  PHQ-9 Total Score 13 15 - - 13       Assessment and Plan: 64 yo separated Latina with a long history of depression and anxiety.She states thatfirst time she experienced depression was [phone] after her mother died.Then inthe 80s  she lost her first husband because he drank a lot. She is never really had any psychiatric treatmentthough.More recently she has beendealing with a lot of medical problems including rheumatoid  arthritis,fibromyalgia,uterineadenocancer which has recently metastasized to the lung. She is receiving treatment from [facility]. Recently started on supplemental oxygen which she feels she "does not need". Medications for depression/anxiety have been prescribed by her oncologist but she has not been consistent with taking some of them. SAhe is on duloxetine 60 mg bid, aripiprazole 2 mg at HS and lorazepam 1 mg at HS for sleep/anxiety(I changed to bid prn but she uses it rarely during the day). Aura reports that she has on and off problems with intial insomnia and feels anxious most of the time. Her husband of 4 years abruptly left her in 2017 and went to [place]. They are still in touch with each other.This situation contributes to low grade depression. She has five brothers in [place] and one of them passed away from [place] pneumonia in late August. She went to [place] for a funeral and says meeting her family was a good experience despite the sadness of the event.   Dx: GAD; MDD recurrentmild  Plan: Continue duloxetine 60 mg bid, aripiprazole 2 mg at HS andincrease lorazepam to 1 mgbid prn anxiety/sleep. Next visit intwomonth. The plan was discussed with patient who had an opportunity to ask questions and these were all answered. I spend54minutes inphone consultationwith the patient.    Stephanie Acre, MD 11/09/2019, 10:08 AM

## 2019-12-08 ENCOUNTER — Telehealth (INDEPENDENT_AMBULATORY_CARE_PROVIDER_SITE_OTHER): Payer: Medicare Other | Admitting: Psychiatry

## 2019-12-08 ENCOUNTER — Other Ambulatory Visit: Payer: Self-pay

## 2019-12-08 DIAGNOSIS — F411 Generalized anxiety disorder: Secondary | ICD-10-CM

## 2019-12-08 DIAGNOSIS — F331 Major depressive disorder, recurrent, moderate: Secondary | ICD-10-CM | POA: Diagnosis not present

## 2019-12-08 MED ORDER — ARIPIPRAZOLE 5 MG PO TABS: 5.0000 mg | ORAL_TABLET | Freq: Every day | ORAL | 0 refills | Status: DC

## 2019-12-08 MED ORDER — LORAZEPAM 1 MG PO TABS: 1.0000 mg | ORAL_TABLET | Freq: Three times a day (TID) | ORAL | 2 refills | Status: AC | PRN

## 2019-12-08 NOTE — Progress Notes (Signed)
[place] MD/PA/NP OP Progress Note  12/08/2019 1:43 PM [name]  MRN:  [id number] Interview was conducted by phone and I verified that I was speaking with the correct person using two identifiers. I discussed the limitations of evaluation and management by telemedicine and  the availability of in person appointments. Patient expressed understanding and agreed to proceed. Patient location - home; physician - home office.  Chief Complaint: Depressed, more anxious and irritable.  HPI: 64 yo separated Latina with a long history of depression and anxiety.She states thatfirst time she experienced depression was [phone] after her mother died.Then inthe 80s she lost her first husband because he drank a lot. 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 She is receiving treatment from [facility]. Recently started on supplemental oxygen which she feels she "does not need". Medications for depression/anxiety have been prescribed by her oncologist but she has not been consistent with taking some of them. SAhe is on duloxetine 60 mg bid, aripiprazole 2 mg at HS and lorazepam 1 mg at HS for sleep/anxiety(I changed to bid prn but she uses it rarely during the day). Rebekah [name] reports that she has on and off problems with intial insomnia and feels anxious most of the time. Her husband of 4 years abruptly left her in 2017 and went to [place]. They are still in touch with each other.This situation contributes to low grade depression.She has five brothers in [place] and one of them passed away from [place] pneumonia in late August. She went to [place] for a funeral and says meeting her family was a good experience despite the sadness of the event. Since then she has been more anxious and irritable while remaining  depressed.    Visit Diagnosis:    ICD-10-CM   1. Major depressive disorder, recurrent episode, moderate (HCC)  F33.1   2. Generalized anxiety disorder  F41.1     Past Psychiatric History: Please see intake H&P.  Past Medical History:  Past Medical History:  Diagnosis Date  . Anxiety   . Arthritis   . Asthma    NO INHALERS USED  . Cancer ([place])    ENDOMETRIAL  . Depression   . Family history of breast cancer   . Family history of colon cancer   . Fibromyalgia   . GERD (gastroesophageal reflux disease)   . Insomnia   . Seizure disorder ([place])    LAST SEIZURE FEBRUARY 2019 SEES DR [facility]   . Seizures ([place])     Past Surgical History:  Procedure Laterality Date  . ABDOMINAL HYSTERECTOMY    . BREAST BIOPSY Right    biopsy in the 1990's  . BUNIONECTOMY Bilateral    2000's - R ONCE and L once  . CARPAL TUNNEL RELEASE Bilateral   . COLONSCOPY    . ENDOMETRIAL BIOPSY  02/2017  . IR FLUORO GUIDE PORT INSERTION RIGHT  04/24/2017  . IR REMOVAL TUN ACCESS W/ PORT W/O FL MOD SED  10/07/2017  . IR US GUIDE VASC ACCESS RIGHT  04/24/2017  . ROBOTIC ASSISTED TOTAL HYSTERECTOMY WITH BILATERAL SALPINGO OOPHERECTOMY Bilateral 03/26/2017   Procedure: XI ROBOTIC ASSISTED TOTAL HYSTERECTOMY WITH BILATERAL SALPINGO OOPHORECTOMY AND SENTINAL LYMPH NODES;  Surgeon: Everitt Amber, MD;  Location: [facility];  Service: Gynecology;  Laterality: Bilateral;  . TUBAL LIGATION  1984  . TUBAL LIGATION      Family Psychiatric  History: None.  Family History:  Family History  Problem Relation Age of Onset  . Breast cancer Mother 39  . Diabetes Brother   . Hypertension Brother   . Stroke Brother   . Breast cancer Maternal Grandmother        dx >50  . Breast cancer Maternal Aunt        dx <50  . Colon cancer Paternal Aunt   . Cancer Paternal Uncle        type unk  . Breast cancer Maternal Aunt        dx >50  . Cancer Paternal Aunt   . Cancer Paternal Uncle     Social History:   Social History   Socioeconomic History  . Marital status: Married    Spouse name: Not on file  . Number of children: 4  . Years of education: 8  . Highest education level: Not on file  Occupational History  . 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 Smoking status: Never Smoker  . Smokeless tobacco: Never Used  Vaping Use  . Vaping Use: Never used  Substance and Sexual Activity  . Alcohol use: No    Alcohol/week: 0.0 standard drinks  . Drug use: No  . Sexual activity: Not Currently    Partners: Male    Birth control/protection: Post-menopausal  Other Topics Concern  . Not on file  Social History Narrative   Lives with daughter, Rebekah [name] "Nicole Kindred"   Caffeine use: coffee/tea/soda daily   Social Determinants of Health   Financial Resource Strain:   . Difficulty of Paying Living Expenses: Not on file  Food Insecurity:   . Worried About Charity fundraiser in the Last Year: Not on file  . Ran Out of Food in the Last Year: Not on file  Transportation Needs:   . Lack of Transportation (Medical): Not on file  . Lack of Transportation (Non-Medical): Not on file  Physical Activity:   . Days of Exercise per Week: Not on file  . Minutes of Exercise per Session: Not on file  Stress:   . Feeling of Stress : Not on file  Social Connections:   . Frequency of Communication with Friends and Family: Not on file  . Frequency of Social Gatherings with Friends and Family: Not on file  . Attends Religious Services: Not on file  . Active Member of Clubs or Organizations: Not on file  . Attends Archivist Meetings: Not on file  . Marital Status: Not on file    Allergies:  Allergies  Allergen Reactions  . Ambien [Zolpidem Tartrate] Other (See Comments)    Other reaction(s): Hallucination  . Soma [Carisoprodol] Other (See Comments)    chills    Metabolic Disorder Labs: Lab Results  Component Value Date   HGBA1C 5.9 (H) 08/24/2014   MPG 123 (H) 08/24/2014   No results  found for: PROLACTIN Lab Results  Component Value Date   CHOL 193 08/24/2014   TRIG 110 08/24/2014   HDL 58 08/24/2014   CHOLHDL 3.3 08/24/2014   VLDL 22 08/24/2014   LDLCALC 113 08/24/2014   Lab Results  Component Value Date   TSH 1.030 11/07/2016   TSH 1.12 09/21/2015    Therapeutic Level Labs: No results found for: LITHIUM No results found for: VALPROATE No components found for:  CBMZ  Current Medications: Current Outpatient Medications  Medication Sig Dispense Refill  . ARIPiprazole (ABILIFY) 5 MG tablet Take 1 tablet (5 mg total) by  mouth at bedtime. 90 tablet 0  . cholestyramine light (PREVALITE) 4 g packet Take 1 packet (4 g total) by mouth 2 (two) times daily. 42 each 0  . DULoxetine (CYMBALTA) 60 MG capsule Take 1 capsule (60 mg total) by mouth 2 (two) times daily. 889 capsule 0  . folic acid (FOLVITE) 1 MG tablet Take 1 mg by mouth daily.     . hydrOXYzine (ATARAX/VISTARIL) 25 MG tablet Take 50 mg by mouth at bedtime as needed for anxiety.    . lamoTRIgine (LAMICTAL) 100 MG tablet Take 100 mg by mouth See admin instructions. 150mg  in the morning, 100mg  at night.    . levETIRAcetam (KEPPRA) 1000 MG tablet Take 1,500 mg by mouth 2 (two) times daily.     Marland Kitchen loperamide (IMODIUM) 2 MG capsule Take 1 capsule (2 mg total) by mouth every 6 (six) hours as needed for diarrhea or loose stools. 30 capsule 0  . LORazepam (ATIVAN) 1 MG tablet Take 1 tablet (1 mg total) by mouth 3 (three) times daily as needed for anxiety or sleep. 60 tablet 2  . Melatonin 12 MG TABS Take 1 tablet by mouth as needed (sleep).    . ondansetron (ZOFRAN-ODT) 4 MG disintegrating tablet Take 4 mg by mouth every 8 (eight) hours as needed for nausea or vomiting.    . potassium chloride (KLOR-CON) 10 MEQ tablet Take 20 mEq by mouth daily.    . prochlorperazine (COMPAZINE) 10 MG tablet Take 10 mg by mouth every 6 (six) hours as needed for nausea.    . ranitidine (ZANTAC) 150 MG capsule Take 1 capsule (150 mg  total) by mouth 2 (two) times daily. (Patient not taking: Reported on 05/13/2019) 60 capsule 1   No current facility-administered medications for this visit.      Psychiatric Specialty Exam: Review of Systems  Psychiatric/Behavioral: Positive for dysphoric mood. The patient is nervous/anxious.   All other systems reviewed and are negative.   There were no vitals taken for this visit.There is no height or weight on file to calculate BMI.  General Appearance: NA  Eye Contact:  NA  Speech:  Clear and Coherent and Normal Rate  Volume:  Normal  Mood:  Anxious, Depressed and Irritable  Affect:  NA  Thought Process:  Goal Directed  Orientation:  Full (Time, Place, and Person)  Thought Content: Rumination   Suicidal Thoughts:  No  Homicidal Thoughts:  No  Memory:  Immediate;   Good Recent;   Good Remote;   Good  Judgement:  Good  Insight:  Fair  Psychomotor Activity:  NA  Concentration:  Concentration: Fair  Recall:  Good  Fund of Knowledge: Fair  Language: Good  Akathisia:  Negative  Handed:  Right  AIMS (if indicated): not done  Assets:  Communication Skills Desire for Improvement Financial Resources/Insurance Housing Resilience Social Support  ADL's:  Intact  Cognition: WNL  Sleep:  Fair   Screenings: PHQ2-9     Telemedicine from 05/29/2018 in Primary Care at [place] from 03/13/2018 in Primary Care at [place] from 01/03/2018 in Primary Care at [place] from 11/29/2017 in Primary Care at [place] from 10/03/2017 in Primary Care at [facility] Total Score 5 4 0 0 4  PHQ-9 Total Score 13 15 -- -- 13       Assessment and Plan: 64 yo separated Latina with a long history of depression and anxiety.She states thatfirst time she experienced depression was [phone]  after her mother died.Then inthe 80s she lost her first husband because he drank a lot. 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 She is receiving treatment from [facility]. Recently started on supplemental oxygen which she feels she "does not need". Medications for depression/anxiety have been prescribed by her oncologist but she has not been consistent with taking some of them. She is on duloxetine 60 mg bid, aripiprazole 2 mg at HS and lorazepam 1 mg at HS for sleep/anxiety(I changed to bid prn but she uses it rarely during the day). Rebekah [name] reports that she has on and off problems with intial insomnia and feels anxious most of the time. Her husband of 4 years abruptly left her in 2017 and went to [place]. They are still in touch with each other.This situation contributes to low grade depression.She has five brothers in [place] and one of them passed away from [place] pneumonia in late August. She went to [place] for a funeral and says meeting her family was a good experience despite the sadness of the event. Since then she has been more anxious and irritable while remaining depressed.  Dx: GAD; MDD recurrentmoderate  Plan: Continue duloxetine 60 mg bid, increase aripiprazole to 5 mg at HS andincreaselorazepam to1mg tid prn anxiety/sleep. She would also like to be able to talk with a counselor. Next visit in onemonth. The plan was discussed with patient who had an opportunity to ask questions and these were all answered. I spend67minutes inphone consultationwith the patient and her daughter.   Stephanie Acre, MD 12/08/2019, 1:43 PM

## 2020-01-14 ENCOUNTER — Telehealth (INDEPENDENT_AMBULATORY_CARE_PROVIDER_SITE_OTHER): Payer: Medicare Other | Admitting: Psychiatry

## 2020-01-14 ENCOUNTER — Other Ambulatory Visit: Payer: Self-pay

## 2020-01-14 DIAGNOSIS — M797 Fibromyalgia: Secondary | ICD-10-CM

## 2020-01-14 DIAGNOSIS — F411 Generalized anxiety disorder: Secondary | ICD-10-CM | POA: Diagnosis not present

## 2020-01-14 DIAGNOSIS — F330 Major depressive disorder, recurrent, mild: Secondary | ICD-10-CM | POA: Diagnosis not present

## 2020-01-14 MED ORDER — DULOXETINE HCL 60 MG PO CPEP: 60.0000 mg | ORAL_CAPSULE | Freq: Two times a day (BID) | ORAL | 0 refills | Status: DC

## 2020-01-14 NOTE — Progress Notes (Signed)
[place] MD/PA/NP OP Progress Note  01/14/2020 10:05 AM Rebekah [name]  MRN:  [id number] Interview was conducted by phone and I verified that I was speaking with the correct person using two identifiers. I discussed the limitations of evaluation and management by telemedicine and  the availability of in person appointments. Patient expressed understanding and agreed to proceed. Participants in the visit: patient (location - home); physician (location - home office).  Chief Complaint: "I am fine but my arms hurt".  HPI: 64 yo separated Latina with a long history of depression and anxiety.She states thatfirst time she experienced depression was [phone] after her mother died.Then inthe 80s she lost her first husband because he drank a lot. She is never really had any psychiatric treatmentthough.More recently she has beendealing with a lot of medical problems including rheumatoid arthritis,fibromyalgia,uterineadenomacarcinoma with metastases to the lung. She is on chemotherapy.Medications for depression/anxiety have been prescribed by her oncologist but she has not been consistent with taking some of them.She is onduloxetine 60 mg bid,aripiprazole at HS and lorazepam1mg  at HS for sleep/anxiety(I changed tobid prn but she uses it rarely during the day). Rebekah [name] reports that she has on and off problems with intial insomnia and feels anxious most of the time. Her husband of 4 years abruptly left her in 2017 and went to [place]. They are still in touch with each other.This situation contributes to low grade depression. We have recently increased  Aripiprazole to 5 mg - tolerates it well.   Visit Diagnosis:    ICD-10-CM   1. Generalized anxiety disorder  F41.1   2. Fibromyalgia  M79.7 DULoxetine (CYMBALTA) 60 MG capsule  3. Mild episode of recurrent major depressive disorder (HCC)  F33.0 DULoxetine (CYMBALTA) 60 MG capsule    Past Psychiatric History: Please see intake H&P.  Past  Medical History:  Past Medical History:  Diagnosis Date  . Anxiety   . Arthritis   . Asthma    NO INHALERS USED  . Cancer ([place])    ENDOMETRIAL  . Depression   . Family history of breast cancer   . Family history of colon cancer   . Fibromyalgia   . GERD (gastroesophageal reflux disease)   . Insomnia   . Seizure disorder ([place])    LAST SEIZURE FEBRUARY 2019 SEES DR [facility]   . Seizures ([place])     Past Surgical History:  Procedure Laterality Date  . ABDOMINAL HYSTERECTOMY    . BREAST BIOPSY Right    biopsy in the 1990's  . BUNIONECTOMY Bilateral    2000's - R ONCE and L once  . CARPAL TUNNEL RELEASE Bilateral   . COLONSCOPY    . ENDOMETRIAL BIOPSY  02/2017  . IR FLUORO GUIDE PORT INSERTION RIGHT  04/24/2017  . IR REMOVAL TUN ACCESS W/ PORT W/O FL MOD SED  10/07/2017  . IR US GUIDE VASC ACCESS RIGHT  04/24/2017  . ROBOTIC ASSISTED TOTAL HYSTERECTOMY WITH BILATERAL SALPINGO OOPHERECTOMY Bilateral 03/26/2017   Procedure: XI ROBOTIC ASSISTED TOTAL HYSTERECTOMY WITH BILATERAL SALPINGO OOPHORECTOMY AND SENTINAL LYMPH NODES;  Surgeon: Everitt Amber, MD;  Location: [facility];  Service: Gynecology;  Laterality: Bilateral;  . TUBAL LIGATION  1984  . TUBAL LIGATION      Family Psychiatric History: None.  Family History:  Family History  Problem Relation Age of Onset  . Breast cancer Mother 53  . Diabetes Brother   . Hypertension Brother   . Stroke Brother   . Breast cancer Maternal Grandmother  dx >50  . Breast cancer Maternal Aunt        dx <50  . Colon cancer Paternal Aunt   . Cancer Paternal Uncle        type unk  . Breast cancer Maternal Aunt        dx >50  . Cancer Paternal Aunt   . Cancer Paternal Uncle     Social History:  Social History   Socioeconomic History  . Marital status: Married    Spouse name: Not on file  . Number of children: 4  . Years of education: 8  . Highest education level: Not on file  Occupational History  . 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 Smoking status: Never Smoker  . Smokeless tobacco: Never Used  Vaping Use  . Vaping Use: Never used  Substance and Sexual Activity  . Alcohol use: No    Alcohol/week: 0.0 standard drinks  . Drug use: No  . Sexual activity: Not Currently    Partners: Male    Birth control/protection: Post-menopausal  Other Topics Concern  . Not on file  Social History Narrative   Lives with daughter, Rebekah [name] "Nicole Kindred"   Caffeine use: coffee/tea/soda daily   Social Determinants of Health   Financial Resource Strain: Not on file  Food Insecurity: Not on file  Transportation Needs: Not on file  Physical Activity: Not on file  Stress: Not on file  Social Connections: Not on file    Allergies:  Allergies  Allergen Reactions  . Ambien [Zolpidem Tartrate] Other (See Comments)    Other reaction(s): Hallucination  . Soma [Carisoprodol] Other (See Comments)    chills    Metabolic Disorder Labs: Lab Results  Component Value Date   HGBA1C 5.9 (H) 08/24/2014   MPG 123 (H) 08/24/2014   No results found for: PROLACTIN Lab Results  Component Value Date   CHOL 193 08/24/2014   TRIG 110 08/24/2014   HDL 58 08/24/2014   CHOLHDL 3.3 08/24/2014   VLDL 22 08/24/2014   LDLCALC 113 08/24/2014   Lab Results  Component Value Date   TSH 1.030 11/07/2016   TSH 1.12 09/21/2015    Therapeutic Level Labs: No results found for: LITHIUM No results found for: VALPROATE No components found for:  CBMZ  Current Medications: Current Outpatient Medications  Medication Sig Dispense Refill  . ARIPiprazole (ABILIFY) 5 MG tablet Take 1 tablet (5 mg total) by mouth at bedtime. 90 tablet 0  . cholestyramine light (PREVALITE) 4 g packet Take 1 packet (4 g total) by mouth 2 (two) times daily. 42 each 0  . DULoxetine (CYMBALTA) 60 MG capsule Take 1 capsule (60 mg total) by mouth 2 (two) times daily. [id number] capsule 0  . folic acid (FOLVITE) 1 MG tablet Take 1 mg by mouth daily.     .  hydrOXYzine (ATARAX/VISTARIL) 25 MG tablet Take 50 mg by mouth at bedtime as needed for anxiety.    . lamoTRIgine (LAMICTAL) 100 MG tablet Take 100 mg by mouth See admin instructions. 150mg  in the morning, 100mg  at night.    . levETIRAcetam (KEPPRA) 1000 MG tablet Take 1,500 mg by mouth 2 (two) times daily.     Marland Kitchen loperamide (IMODIUM) 2 MG capsule Take 1 capsule (2 mg total) by mouth every 6 (six) hours as needed for diarrhea or loose stools. 30 capsule 0  . LORazepam (ATIVAN) 1 MG tablet Take 1 tablet (1 mg total) by mouth 3 (three) times daily as needed for  anxiety or sleep. 60 tablet 2  . Melatonin 12 MG TABS Take 1 tablet by mouth as needed (sleep).    . ondansetron (ZOFRAN-ODT) 4 MG disintegrating tablet Take 4 mg by mouth every 8 (eight) hours as needed for nausea or vomiting.    . potassium chloride (KLOR-CON) 10 MEQ tablet Take 20 mEq by mouth daily.    . prochlorperazine (COMPAZINE) 10 MG tablet Take 10 mg by mouth every 6 (six) hours as needed for nausea.    . ranitidine (ZANTAC) 150 MG capsule Take 1 capsule (150 mg total) by mouth 2 (two) times daily. (Patient not taking: Reported on 05/13/2019) 60 capsule 1   No current facility-administered medications for this visit.     Psychiatric Specialty Exam: Review of Systems  Musculoskeletal: Positive for myalgias.  Psychiatric/Behavioral: The patient is nervous/anxious.   All other systems reviewed and are negative.   There were no vitals taken for this visit.There is no height or weight on file to calculate BMI.  General Appearance: NA  Eye Contact:  NA  Speech:  Clear and Coherent and Normal Rate  Volume:  Normal  Mood:  Depressed  Affect:  NA  Thought Process:  Goal Directed  Orientation:  Full (Time, Place, and Person)  Thought Content: Logical   Suicidal Thoughts:  No  Homicidal Thoughts:  No  Memory:  Immediate;   Good Recent;   Good Remote;   Good  Judgement:  Good  Insight:  Fair  Psychomotor Activity:  NA   Concentration:  Concentration: Good  Recall:  Good  Fund of Knowledge: Good  Language: Good  Akathisia:  Negative  Handed:  Right  AIMS (if indicated): not done  Assets:  Communication Skills Desire for Improvement Financial Resources/Insurance Housing Social Support  ADL's:  Intact  Cognition: WNL  Sleep:  Fair   Screenings: PHQ2-9   [place] [address] from 05/29/2018 in Primary Care at [place] from 03/13/2018 in [place] at [place] from 01/03/2018 in Primary Care at [place] from 11/29/2017 in Primary Care at [place] from 10/03/2017 in Primary Care at [facility] Total Score 5 4 0 0 4  PHQ-9 Total Score 13 15 -- -- 13       Assessment and Plan: 64 yo separated Latina with a long history of depression and anxiety.She states thatfirst time she experienced depression was [phone] after her mother died.Then inthe 80s she lost her first husband because he drank a lot. She is never really had any psychiatric treatmentthough.More recently she has beendealing with a lot of medical problems including rheumatoid arthritis,fibromyalgia,uterineadenomacarcinoma with metastases to the lung. She is on chemotherapy.Medications for depression/anxiety have been prescribed by her oncologist but she has not been consistent with taking some of them.She is onduloxetine 60 mg bid,aripiprazole at HS and lorazepam1mg  at HS for sleep/anxiety(I changed tobid prn but she uses it rarely during the day). Laylanie reports that she has on and off problems with intial insomnia and feels anxious most of the time. Her husband of 4 years abruptly left her in 2017 and went to [place]. They are still in touch with each other.This situation contributes to low grade depression. We have recently increased  Aripiprazole to 5 mg - tolerates it well.  Dx: GAD; MDD recurrentmild  Plan: Continue duloxetine 60 mg bid, aripiprazole 5 mg at HS  andlorazepam 1mg bid prn anxiety/sleep. She would also like to be able to talk with a counselor but has not  called yet to set up an appointment. Next visit in twomonths. The plan was discussed with patient who had an opportunity to ask questions and these were all answered. I spend83minutes inphone consultationwith the patient.   Magdalene Patricia, MD 01/14/2020, 10:05 AM

## 2020-03-08 ENCOUNTER — Other Ambulatory Visit: Payer: Self-pay

## 2020-03-08 ENCOUNTER — Telehealth (INDEPENDENT_AMBULATORY_CARE_PROVIDER_SITE_OTHER): Payer: Medicare Other | Admitting: Psychiatry

## 2020-03-08 DIAGNOSIS — F330 Major depressive disorder, recurrent, mild: Secondary | ICD-10-CM | POA: Diagnosis not present

## 2020-03-08 DIAGNOSIS — M797 Fibromyalgia: Secondary | ICD-10-CM

## 2020-03-08 DIAGNOSIS — F411 Generalized anxiety disorder: Secondary | ICD-10-CM | POA: Diagnosis not present

## 2020-03-08 MED ORDER — DULOXETINE HCL 60 MG PO CPEP: 60.0000 mg | ORAL_CAPSULE | Freq: Two times a day (BID) | ORAL | 0 refills | Status: DC

## 2020-03-08 MED ORDER — ARIPIPRAZOLE 5 MG PO TABS: 5.0000 mg | ORAL_TABLET | Freq: Every day | ORAL | 0 refills | Status: DC

## 2020-03-08 MED ORDER — LORAZEPAM 1 MG PO TABS: 1.0000 mg | ORAL_TABLET | Freq: Two times a day (BID) | ORAL | 2 refills | Status: AC | PRN

## 2020-03-08 NOTE — Progress Notes (Signed)
[place] MD/PA/NP OP Progress Note  03/08/2020 10:09 AM Rebekah [name]  MRN:  [id number] Interview was conducted by phone and I verified that I was speaking with the correct person using two identifiers. I discussed the limitations of evaluation and management by telemedicine and  the availability of in person appointments. Patient expressed understanding and agreed to proceed. Participants in the visit: patient (location - home); physician (location - home office).  Chief Complaint: Anxiety.  HPI: 65 yo separated Latina with a long history of depression and anxiety.She states thatfirst time she experienced depression was [phone] after her mother died.Then inthe 80s she lost her first husband because he drank a lot. 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 [name] reports that she has on and off problems with intial insomnia and feels anxious most of the time. She gets monoclonal antibody treatment but was told that cancer is "active" and this coused her to feel more anxious.    Visit Diagnosis:    ICD-10-CM   1. Generalized anxiety disorder  F41.1   2. Fibromyalgia  M79.7 DULoxetine (CYMBALTA) 60 MG capsule  3. Mild episode of recurrent major depressive disorder (HCC)  F33.0 DULoxetine (CYMBALTA) 60 MG capsule    Past Psychiatric History: Please see intake H&P.  Past Medical History:  Past Medical History:  Diagnosis Date  . Anxiety   . Arthritis   . Asthma    NO INHALERS USED  . Cancer ([place])    ENDOMETRIAL  .  Depression   . Family history of breast cancer   . Family history of colon cancer   . Fibromyalgia   . GERD (gastroesophageal reflux disease)   . Insomnia   . Seizure disorder ([place])    LAST SEIZURE FEBRUARY 2019 SEES DR [facility]   . Seizures ([place])     Past Surgical History:  Procedure Laterality Date  . ABDOMINAL HYSTERECTOMY    . BREAST BIOPSY Right    biopsy in the 1990's  . BUNIONECTOMY Bilateral    2000's - R ONCE and L once  . CARPAL TUNNEL RELEASE Bilateral   . COLONSCOPY    . ENDOMETRIAL BIOPSY  02/2017  . IR FLUORO GUIDE PORT INSERTION RIGHT  04/24/2017  . IR REMOVAL TUN ACCESS W/ PORT W/O FL MOD SED  10/07/2017  . IR US GUIDE VASC ACCESS RIGHT  04/24/2017  . ROBOTIC ASSISTED TOTAL HYSTERECTOMY WITH BILATERAL SALPINGO OOPHERECTOMY Bilateral 03/26/2017   Procedure: XI ROBOTIC ASSISTED TOTAL HYSTERECTOMY WITH BILATERAL SALPINGO OOPHORECTOMY AND SENTINAL LYMPH NODES;  Surgeon: Everitt Amber, MD;  Location: [facility];  Service: Gynecology;  Laterality: Bilateral;  . TUBAL LIGATION  1984  . TUBAL LIGATION      Family Psychiatric History: None.  Family History:  Family History  Problem Relation Age of Onset  . Breast cancer Mother 26  . Diabetes Brother   . Hypertension Brother   . Stroke Brother   . Breast cancer Maternal Grandmother        dx >50  . Breast cancer Maternal Aunt        dx <50  . Colon cancer Paternal Aunt   .  Cancer Paternal Uncle        type unk  . Breast cancer Maternal Aunt        dx >50  . Cancer Paternal Aunt   . Cancer Paternal Uncle     Social History:  Social History   Socioeconomic History  . Marital status: Married    Spouse name: Not on file  . Number of children: 4  . Years of education: 8  . Highest education level: Not on file  Occupational History  . 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 Smoking status: Never Smoker  . Smokeless tobacco: Never Used  Vaping Use  . Vaping Use: Never used  Substance and Sexual  Activity  . Alcohol use: No    Alcohol/week: 0.0 standard drinks  . Drug use: No  . Sexual activity: Not Currently    Partners: Male    Birth control/protection: Post-menopausal  Other Topics Concern  . Not on file  Social History Narrative   Lives with daughter, [name]   Caffeine use: coffee/tea/soda daily   Social Determinants of Health   Financial Resource Strain: Not on file  Food Insecurity: Not on file  Transportation Needs: Not on file  Physical Activity: Not on file  Stress: Not on file  Social Connections: Not on file    Allergies:  Allergies  Allergen Reactions  . Ambien [Zolpidem Tartrate] Other (See Comments)    Other reaction(s): Hallucination  . Soma [Carisoprodol] Other (See Comments)    chills    Metabolic Disorder Labs: Lab Results  Component Value Date   HGBA1C 5.9 (H) 08/24/2014   MPG 123 (H) 08/24/2014   No results found for: PROLACTIN Lab Results  Component Value Date   CHOL 193 08/24/2014   TRIG 110 08/24/2014   HDL 58 08/24/2014   CHOLHDL 3.3 08/24/2014   VLDL 22 08/24/2014   LDLCALC 113 08/24/2014   Lab Results  Component Value Date   TSH 1.030 11/07/2016   TSH 1.12 09/21/2015    Therapeutic Level Labs: No results found for: LITHIUM No results found for: VALPROATE No components found for:  CBMZ  Current Medications: Current Outpatient Medications  Medication Sig Dispense Refill  . [START ON 04/04/2020] LORazepam (ATIVAN) 1 MG tablet Take 1 tablet (1 mg total) by mouth 2 (two) times daily as needed for anxiety. 60 tablet 2  . ARIPiprazole (ABILIFY) 5 MG tablet Take 1 tablet (5 mg total) by mouth at bedtime. 90 tablet 0  . cholestyramine light (PREVALITE) 4 g packet Take 1 packet (4 g total) by mouth 2 (two) times daily. 42 each 0  . DULoxetine (CYMBALTA) 60 MG capsule Take 1 capsule (60 mg total) by mouth 2 (two) times daily. 553 capsule 0  . folic acid (FOLVITE) 1 MG tablet Take 1 mg by mouth daily.     . hydrOXYzine  (ATARAX/VISTARIL) 25 MG tablet Take 50 mg by mouth at bedtime as needed for anxiety.    . lamoTRIgine (LAMICTAL) 100 MG tablet Take 100 mg by mouth See admin instructions. 150mg  in the morning, 100mg  at night.    . levETIRAcetam (KEPPRA) 1000 MG tablet Take 1,500 mg by mouth 2 (two) times daily.     Marland Kitchen loperamide (IMODIUM) 2 MG capsule Take 1 capsule (2 mg total) by mouth every 6 (six) hours as needed for diarrhea or loose stools. 30 capsule 0  . Melatonin 12 MG TABS Take 1 tablet by mouth as needed (sleep).    . ondansetron (  ZOFRAN-ODT) 4 MG disintegrating tablet Take 4 mg by mouth every 8 (eight) hours as needed for nausea or vomiting.    . potassium chloride (KLOR-CON) 10 MEQ tablet Take 20 mEq by mouth daily.    . prochlorperazine (COMPAZINE) 10 MG tablet Take 10 mg by mouth every 6 (six) hours as needed for nausea.    . ranitidine (ZANTAC) 150 MG capsule Take 1 capsule (150 mg total) by mouth 2 (two) times daily. (Patient not taking: Reported on 05/13/2019) 60 capsule 1   No current facility-administered medications for this visit.     Psychiatric Specialty Exam: Review of Systems  Respiratory: Positive for cough.   Psychiatric/Behavioral: The patient is nervous/anxious.   All other systems reviewed and are negative.   There were no vitals taken for this visit.There is no height or weight on file to calculate BMI.  General Appearance: NA  Eye Contact:  NA  Speech:  Clear and Coherent and Normal Rate  Volume:  Normal  Mood:  Anxious  Affect:  NA  Thought Process:  Goal Directed  Orientation:  Full (Time, Place, and Person)  Thought Content: Logical   Suicidal Thoughts:  No  Homicidal Thoughts:  No  Memory:  Immediate;   Good Recent;   Good Remote;   Good  Judgement:  Good  Insight:  Fair  Psychomotor Activity:  NA  Concentration:  Concentration: Good  Recall:  Good  Fund of Knowledge: Good  Language: Good  Akathisia:  Negative  Handed:  Right  AIMS (if indicated): not  done  Assets:  Communication Skills Desire for Improvement Financial Resources/Insurance Housing Resilience  ADL's:  Intact  Cognition: WNL  Sleep:  Fair   Screenings: PHQ2-9   [place] from 05/29/2018 in Primary Care at [facility] from 03/13/2018 in [place] at [facility] from 01/03/2018 in Primary Care at [place] from 11/29/2017 in Primary Care at [place] from 10/03/2017 in Primary Care at [facility] Total Score 5 4 0 0 4  PHQ-9 Total Score 13 15 - - 13       Assessment and Plan: 65 yo separated Latina with a long history of depression and anxiety.She states thatfirst time she experienced depression was [phone] after her mother died.Then inthe 80s she lost her first husband because he drank a lot. 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 [place]ci reports that she has on and off problems with intial insomnia and feels anxious most of the time. She gets monoclonal antibody treatment but was told that cancer is "active" and this coused her to feel more anxious.  Dx: GAD; MDD recurrentmild  Plan: Continue duloxetine 60 mg bid, aripiprazole5 mgat HS andlorazepam 1mg bid prn anxiety/sleep.She would also like to be able to talk with a counselor.Next visit intwomonths with anew provider. The plan was discussed with patient who had an opportunity to ask questions and these were all answered. I spend65minutes inphone consultationwith the  patient.     Stephanie Acre, MD 03/08/2020, 10:09 AM

## 2020-03-11 IMAGING — CT CT ABD-PELV W/ CM
2 of 5 series · 16 of 46 positions shown, 18 images · IV contrast (omnipaque)
Comparison: CT scan 04/16/2017

CLINICAL DATA: Endometrial cancer status post chemotherapy and
radiation therapy.

EXAM:
CT ABDOMEN AND PELVIS WITH CONTRAST
TECHNIQUE: Multidetector CT imaging of the abdomen and pelvis was performed
using the standard protocol following bolus administration of
intravenous contrast.
CONTRAST:  100mL OMNIPAQUE IOHEXOL 300 MG/ML  SOLN

[Series 2: axial st · axial · 0.85mm/px · z∈[-778,-358]mm · 13 of 100 slices shown, 15 images]
[im 8/100  soft-tissue]
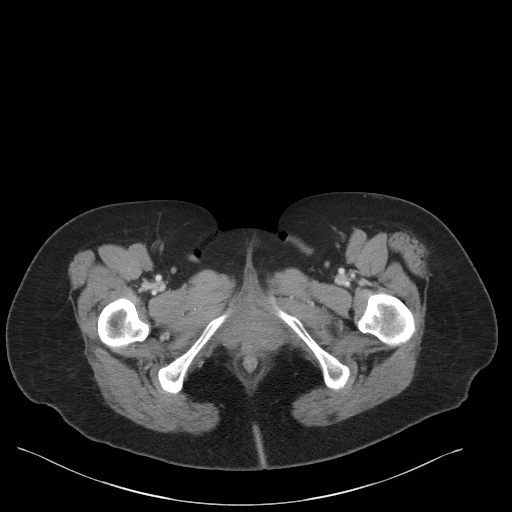
[im 8/100  bone]
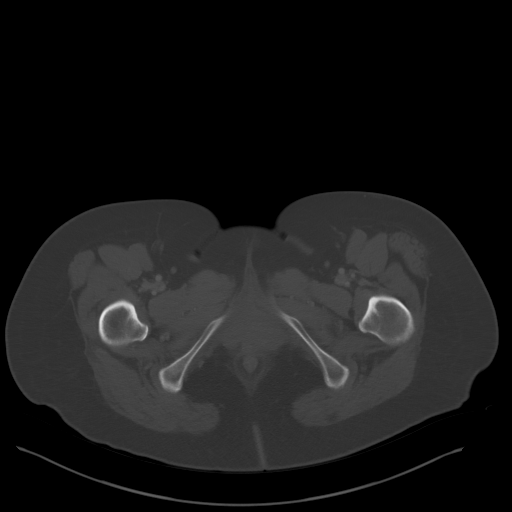
[im 15/100  soft-tissue]
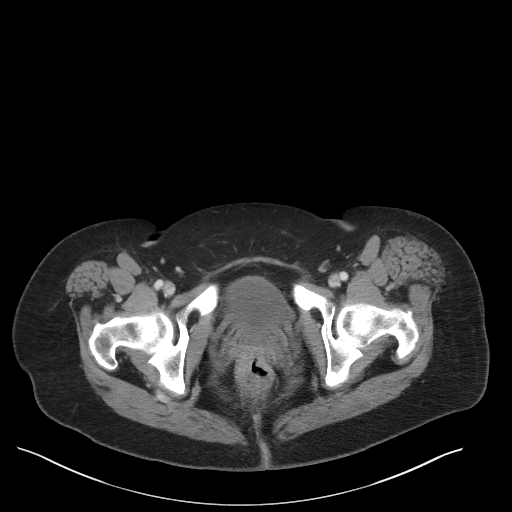
[im 22/100  soft-tissue]
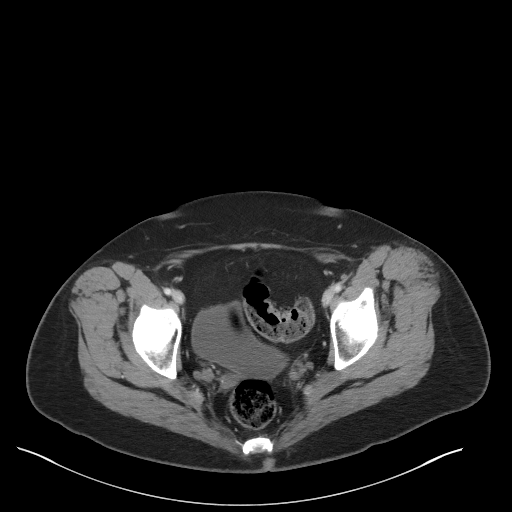
[im 29/100  soft-tissue]
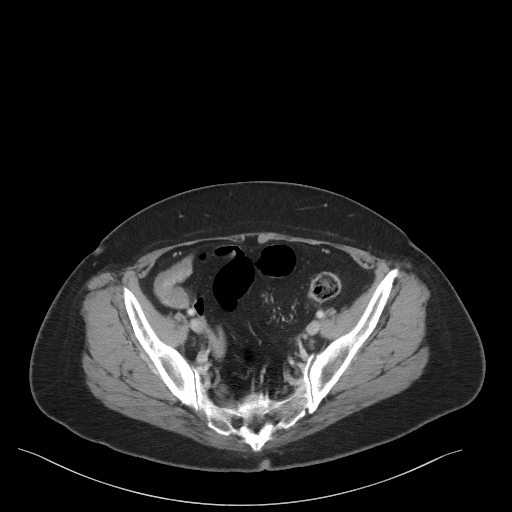
[im 36/100  soft-tissue]
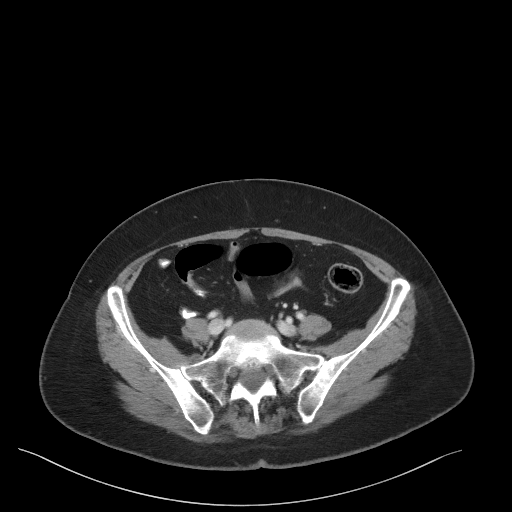
[im 43/100  soft-tissue]
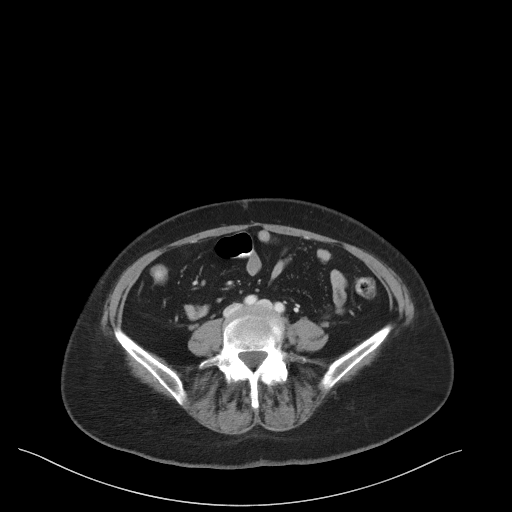
[im 50/100  soft-tissue]
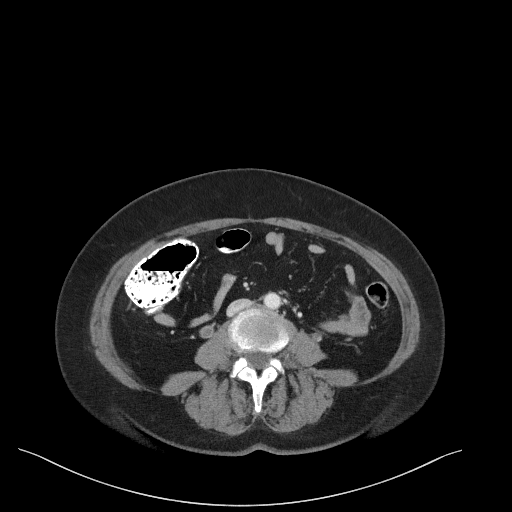
[im 57/100  soft-tissue]
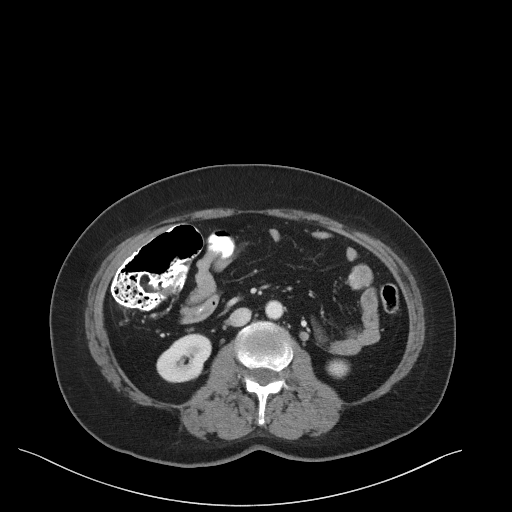
[im 64/100  soft-tissue]
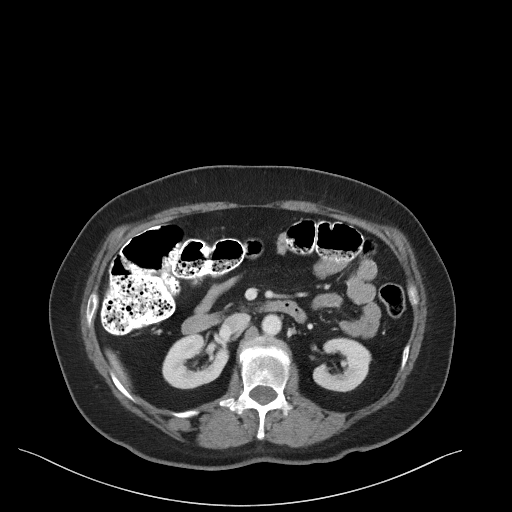
[im 64/100  bone]
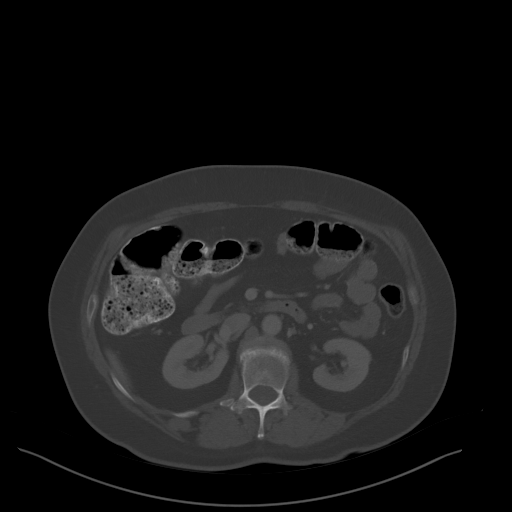
[im 71/100  soft-tissue]
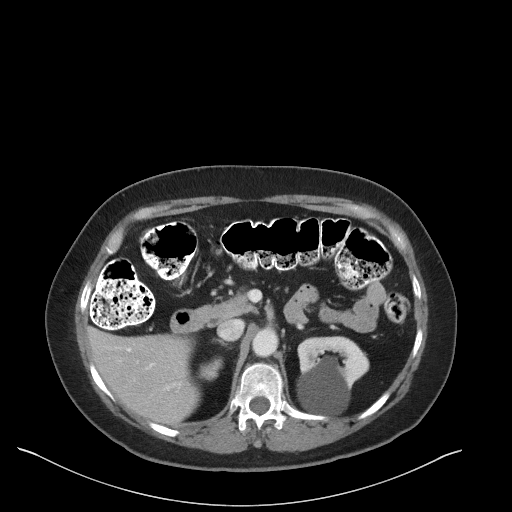
[im 78/100  soft-tissue]
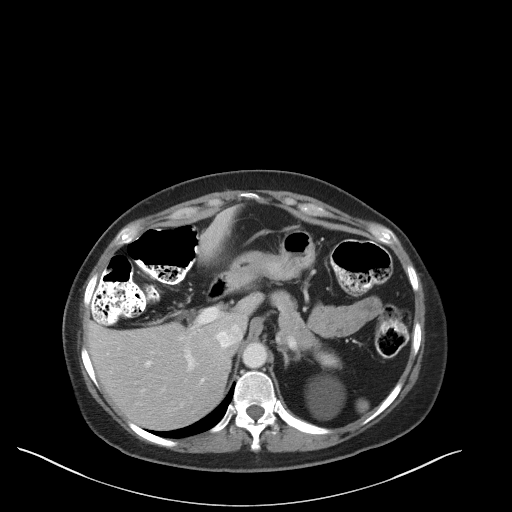
[im 85/100  soft-tissue]
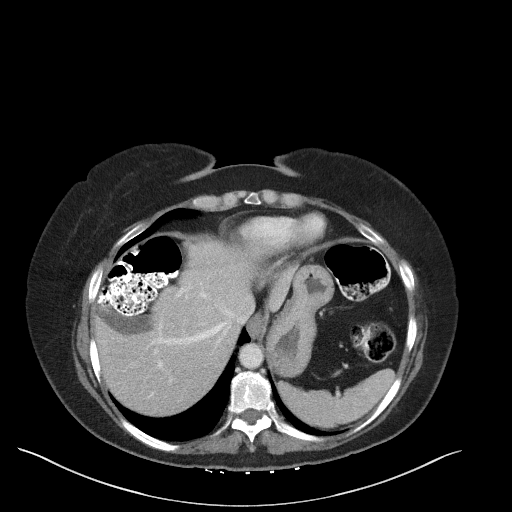
[im 92/100  soft-tissue]
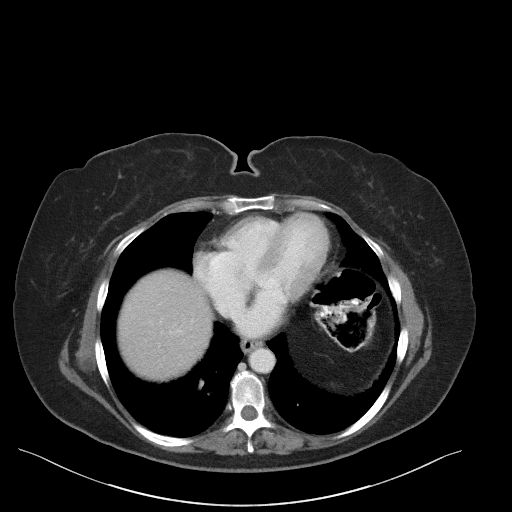

[Series 4: coronal st · coronal · 0.86mm/px · 3 of 75 slices shown]
[im 25/75  soft-tissue]
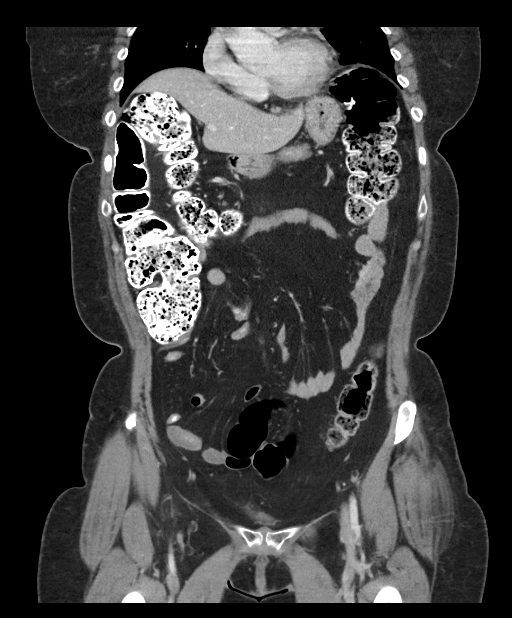
[im 33/75  soft-tissue]
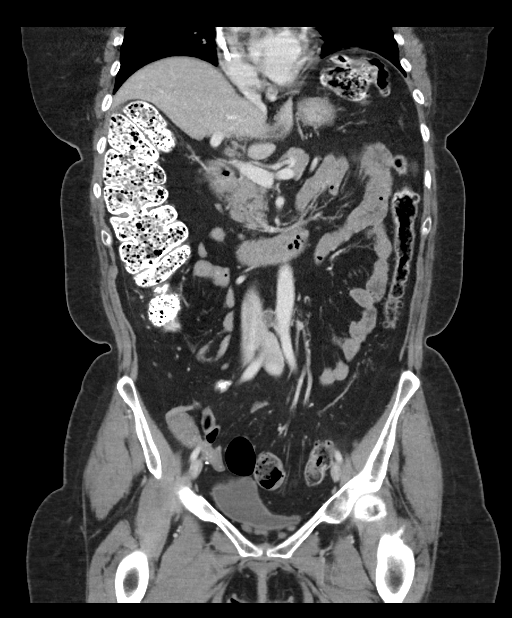
[im 42/75  soft-tissue]
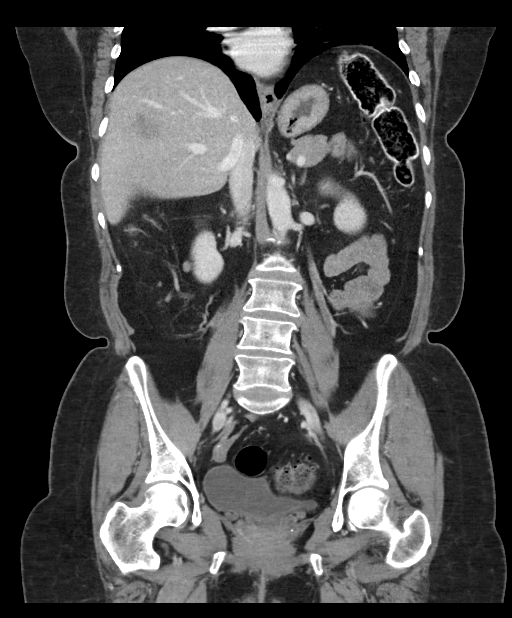

[16 of 46 positions shown; findings below may reference images not displayed]

FINDINGS: Lower chest: The lung bases are clear. No worrisome pulmonary
nodules. The heart is normal in size. No pericardial effusion. The
Port-A-Cath is in good position.

Hepatobiliary: No focal hepatic lesions or peritoneal surface
lesions. Gallbladder is normal. No common bile duct dilatation.

Pancreas: No mass, inflammation or ductal dilatation.

Spleen: Normal size.  No focal lesions.

Adrenals/Urinary Tract: The adrenal glands are normal. Stable left
renal cyst. No worrisome renal lesions.

Stomach/Bowel: The stomach, duodenum, small bowel and colon are
unremarkable. No acute inflammatory changes, mass lesions or
obstructive findings.

Vascular/Lymphatic: Small pericolonic and perirenal lymph nodes are
again identified.

6 mm peri cecal nodule on image number 48 is stable.

7 mm nodule anterior to the right kidney on image number 42
previously measured 8 mm.

4 mm pericolonic lymph node on image number 40 previously measured 5
mm.

No new or progressive findings.

The vascular structures are unremarkable and stable.

Reproductive: Status post hysterectomy and bilateral oophorectomy.

Other: No pelvic mass or pelvic lymphadenopathy. No inguinal mass or
adenopathy.

Musculoskeletal: No significant bony findings.
IMPRESSION: 1. Status post hysterectomy. No findings for recurrent tumor in the
pelvis or pelvic adenopathy.
2. Small scattered abdominal nodules/lymph nodes are stable to
slightly smaller. No new or progressive findings.
3. No evidence of peritoneal surface disease involving the solid
abdominal organs.

## 2020-10-06 ENCOUNTER — Telehealth (HOSPITAL_COMMUNITY): Payer: Self-pay | Admitting: *Deleted

## 2020-10-06 DIAGNOSIS — M797 Fibromyalgia: Secondary | ICD-10-CM

## 2020-10-06 DIAGNOSIS — F330 Major depressive disorder, recurrent, mild: Secondary | ICD-10-CM

## 2020-10-06 MED ORDER — DULOXETINE HCL 60 MG PO CPEP: 60.0000 mg | ORAL_CAPSULE | Freq: Two times a day (BID) | ORAL | 0 refills | Status: AC

## 2020-10-06 NOTE — Telephone Encounter (Signed)
Thirty days bridge supply given by Covering MD. Patient must established care with new provider.

## 2020-10-06 NOTE — Telephone Encounter (Signed)
Former Dr. Montel Culver called requesting a 30 day supply of Cymbalta. Front desk to send letter today.

## 2020-10-11 ENCOUNTER — Telehealth (HOSPITAL_COMMUNITY): Payer: Self-pay

## 2020-10-11 ENCOUNTER — Other Ambulatory Visit (HOSPITAL_COMMUNITY): Payer: Self-pay | Admitting: Psychiatry

## 2020-10-11 MED ORDER — ARIPIPRAZOLE 5 MG PO TABS: 5.0000 mg | ORAL_TABLET | Freq: Every day | ORAL | 0 refills | Status: AC

## 2020-10-11 NOTE — Telephone Encounter (Signed)
Received a fax from [facility] on [address]/[place] requesting a refill on patient's Aripiprazole 5mg . She's a former patient of Dr. Montel Culver and has been sent a cerified letter with list of referrals to find a new provider. Spoke with patient and she's actively doing that. Dr. Adele Schilder had previously sent in a 30 day bridge supply on her Duloxetine 60mg  but patient also needs a 30 day bridge on her Aripiprazole 5mg . Could you please send in a bridge supply? Please review and advise. Thank you

## 2020-10-11 NOTE — Telephone Encounter (Signed)
Sent, please schedule with new provider

## 2021-07-11 ENCOUNTER — Emergency Department (HOSPITAL_COMMUNITY): Payer: Medicare Other

## 2021-07-11 ENCOUNTER — Other Ambulatory Visit: Payer: Self-pay

## 2021-07-11 ENCOUNTER — Encounter (HOSPITAL_COMMUNITY): Payer: Self-pay

## 2021-07-11 ENCOUNTER — Emergency Department (HOSPITAL_COMMUNITY)
Admission: EM | Admit: 2021-07-11 | Discharge: 2021-07-11 | Disposition: A | Discharge status: Other Acute Inpatient Hospital | Payer: Medicare Other | Attending: Emergency Medicine | Admitting: Emergency Medicine

## 2021-07-11 DIAGNOSIS — X501XXA Overexertion from prolonged static or awkward postures, initial encounter: Secondary | ICD-10-CM | POA: Insufficient documentation

## 2021-07-11 DIAGNOSIS — S73015A Posterior dislocation of left hip, initial encounter: Secondary | ICD-10-CM | POA: Insufficient documentation

## 2021-07-11 DIAGNOSIS — Z85118 Personal history of other malignant neoplasm of bronchus and lung: Secondary | ICD-10-CM | POA: Diagnosis not present

## 2021-07-11 DIAGNOSIS — Y92002 Bathroom of unspecified non-institutional (private) residence single-family (private) house as the place of occurrence of the external cause: Secondary | ICD-10-CM | POA: Insufficient documentation

## 2021-07-11 DIAGNOSIS — S79912A Unspecified injury of left hip, initial encounter: Secondary | ICD-10-CM | POA: Diagnosis present

## 2021-07-11 DIAGNOSIS — Y9301 Activity, walking, marching and hiking: Secondary | ICD-10-CM | POA: Insufficient documentation

## 2021-07-11 LAB — CBC WITH DIFFERENTIAL/PLATELET
Abs Immature Granulocytes: 0.1 10*3/uL — ABNORMAL HIGH (ref 0.00–0.07)
Basophils Absolute: 0 10*3/uL (ref 0.0–0.1)
Basophils Relative: 0 %
Eosinophils Absolute: 0 10*3/uL (ref 0.0–0.5)
Eosinophils Relative: 0 %
HCT: 30.8 % — ABNORMAL LOW (ref 36.0–46.0)
Hemoglobin: 10.7 g/dL — ABNORMAL LOW (ref 12.0–15.0)
Immature Granulocytes: 1 %
Lymphocytes Relative: 5 %
Lymphs Abs: 0.5 10*3/uL — ABNORMAL LOW (ref 0.7–4.0)
MCH: 39.3 pg — ABNORMAL HIGH (ref 26.0–34.0)
MCHC: 34.7 g/dL (ref 30.0–36.0)
MCV: 113.2 fL — ABNORMAL HIGH (ref 80.0–100.0)
Monocytes Absolute: 0.4 10*3/uL (ref 0.1–1.0)
Monocytes Relative: 4 %
Neutro Abs: 9.1 10*3/uL — ABNORMAL HIGH (ref 1.7–7.7)
Neutrophils Relative %: 90 %
Platelets: 177 10*3/uL (ref 150–400)
RBC: 2.72 MIL/uL — ABNORMAL LOW (ref 3.87–5.11)
WBC: 10 10*3/uL (ref 4.0–10.5)
nRBC: 0 % (ref 0.0–0.2)

## 2021-07-11 LAB — BASIC METABOLIC PANEL
Anion gap: 13 (ref 5–15)
BUN: 11 mg/dL (ref 8–23)
CO2: 25 mmol/L (ref 22–32)
Calcium: 9.4 mg/dL (ref 8.9–10.3)
Chloride: 95 mmol/L — ABNORMAL LOW (ref 98–111)
Creatinine, Ser: 0.7 mg/dL (ref 0.44–1.00)
GFR, Estimated: >60 mL/min (ref >60)
Glucose, Bld: 106 mg/dL — ABNORMAL HIGH (ref 70–99)
Potassium: 3.2 mmol/L — ABNORMAL LOW (ref 3.5–5.1)
Sodium: 133 mmol/L — ABNORMAL LOW (ref 135–145)

## 2021-07-11 MED ORDER — PROPOFOL 10 MG/ML IV BOLUS
0.5000 mg/kg | Freq: Once | INTRAVENOUS | Status: DC
  Filled 2021-07-11: qty 20

## 2021-07-11 MED ORDER — HYDROMORPHONE HCL 1 MG/ML IJ SOLN
1.0000 mg | Freq: Once | INTRAMUSCULAR | Status: AC
  Administered 2021-07-11: 1 mg via INTRAVENOUS
  Filled 2021-07-11: qty 1

## 2021-07-11 MED ORDER — OXYCODONE-ACETAMINOPHEN 5-325 MG PO TABS
2.0000 | ORAL_TABLET | Freq: Once | ORAL | Status: AC
  Administered 2021-07-11: 2 via ORAL
  Filled 2021-07-11: qty 2

## 2021-07-11 MED ORDER — HYDROMORPHONE HCL 1 MG/ML IJ SOLN
0.5000 mg | Freq: Once | INTRAMUSCULAR | Status: AC
  Administered 2021-07-11: 0.5 mg via INTRAMUSCULAR
  Filled 2021-07-11: qty 1

## 2021-07-11 MED ORDER — HYDROMORPHONE HCL 1 MG/ML IJ SOLN
0.5000 mg | Freq: Once | INTRAMUSCULAR | Status: DC
  Filled 2021-07-11: qty 1

## 2021-07-11 MED ORDER — MORPHINE SULFATE (PF) 4 MG/ML IV SOLN: 4.0000 mg | INTRAVENOUS | Status: DC | PRN

## 2021-07-11 NOTE — ED Triage Notes (Signed)
Per EMS   Pt was walking yesterday and twisted her left knee. Pt complain of knee to hip pain. Hx of CA on medication. Per EMS a/o x4. Pt states it got worse since yesterday. Pt reports lung and bone CA  163/100 BP 120 HR 97% RA uses oxygen as needed

## 2021-07-11 NOTE — Progress Notes (Signed)
Orthopedics consulted for left hip dislocation. 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 The femoral head is now dislocated superomedially. We discussed this case with orthopedic traumatologist Dr. Doreatha Martin. We do not believe a reduction attempt in the OR would be successful due to the lack of acetabulum. The complexity of this case is outside of our scope of practice. We recommend transfer to a tertiary care center such as [place], [place] 07/11/21

## 2021-07-11 NOTE — ED Provider Notes (Signed)
[facility] DEPT Provider Note   CSN: [id number] Arrival date & time: 07/11/21  1111     History  Chief Complaint  Patient presents with   Leg Injury    left    Rebekah [name] is a 66 y.o. female.  The history is provided by the patient and medical records. No language interpreter was used.    66 year old female significant history of metastatic lung cancer, fibromyalgia, seizure disorder, brought here via EMS for evaluation of left knee pain.  Patient reports she was walking out of the bathroom when her left knee gave out and she felt severe pain about the knee.  She noticed progressive worsening pain from the knee spreading towards her hip and down her leg worse with movement.  She tries taking home Percocet medication without adequate relief.  She is using a walker to move about.  She denies any new numbness no new back pain no new weakness.  Home Medications Prior to Admission medications   Medication Sig Start Date End Date Taking? Authorizing Provider  ARIPiprazole (ABILIFY) 5 MG tablet Take 1 tablet (5 mg total) by mouth at bedtime. 10/11/20 01/09/21  Cloria Spring, MD  cholestyramine light (PREVALITE) 4 g packet Take 1 packet (4 g total) by mouth 2 (two) times daily. 05/17/19   Florencia Reasons, MD  DULoxetine (CYMBALTA) 60 MG capsule Take 1 capsule (60 mg total) by mouth 2 (two) times daily. 10/06/20   Arfeen, Arlyce Harman, MD  folic acid (FOLVITE) 1 MG tablet Take 1 mg by mouth daily.  05/18/18   [provider]  hydrOXYzine (ATARAX/VISTARIL) 25 MG tablet Take 50 mg by mouth at bedtime as needed for anxiety.    [provider]  lamoTRIgine (LAMICTAL) 100 MG tablet Take 100 mg by mouth See admin instructions. '150mg'$  in the morning, '100mg'$  at night. 05/11/18   [provider]  levETIRAcetam (KEPPRA) 1000 MG tablet Take 1,500 mg by mouth 2 (two) times daily.     [provider]  loperamide (IMODIUM) 2 MG capsule Take 1  capsule (2 mg total) by mouth every 6 (six) hours as needed for diarrhea or loose stools. 05/17/19   Florencia Reasons, MD  Melatonin 12 MG TABS Take 1 tablet by mouth as needed (sleep).    [provider]  ondansetron (ZOFRAN-ODT) 4 MG disintegrating tablet Take 4 mg by mouth every 8 (eight) hours as needed for nausea or vomiting.    [provider]  potassium chloride (KLOR-CON) 10 MEQ tablet Take 20 mEq by mouth daily.    [provider]  prochlorperazine (COMPAZINE) 10 MG tablet Take 10 mg by mouth every 6 (six) hours as needed for nausea.    [provider]  ranitidine (ZANTAC) 150 MG capsule Take 1 capsule (150 mg total) by mouth 2 (two) times daily. Patient not taking: Reported on 05/13/2019 08/19/17   Jacelyn Pi, Lilia Argue, MD      Allergies    Ambien [zolpidem tartrate] and Manuela Neptune [carisoprodol]    Review of Systems   Review of Systems  All other systems reviewed and are negative.   Physical Exam Updated Vital Signs BP (!) 148/100 (BP Location: Right Arm)   Pulse (!) 130   Temp 98.3 F (36.8 C) (Oral)   Resp (!) 22   Ht '4\' 11"'$  (1.499 m)   Wt 65.3 kg   SpO2 100%   BMI 29.08 kg/m  Physical Exam Vitals and nursing note reviewed.  Constitutional:  General: She is not in acute distress.    Appearance: She is well-developed.  HENT:     Head: Atraumatic.  Eyes:     Conjunctiva/sclera: Conjunctivae normal.  Pulmonary:     Effort: Pulmonary effort is normal.  Musculoskeletal:        General: Tenderness (Left knee: Diffuse tenderness on palpation with decreased range of motion secondary to pain.  No obvious deformity noted.  No erythema edema or warmth.) present.     Cervical back: Neck supple.     Comments: Left hip tender to palpation with decreased range of motion.  Left ankle nontender to palpation.  Intact dorsalis pedis.   Skin:    Findings: No rash.  Neurological:     Mental Status: She is alert.  Psychiatric:        Mood and Affect: Mood  normal.    ED Results / Procedures / Treatments   Labs (all labs ordered are listed, but only abnormal results are displayed) Labs Reviewed  CBC WITH DIFFERENTIAL/PLATELET - Abnormal; Notable for the following components:      Result Value   RBC 2.72 (*)    Hemoglobin 10.7 (*)    HCT 30.8 (*)    MCV 113.2 (*)    MCH 39.3 (*)    Neutro Abs 9.1 (*)    Lymphs Abs 0.5 (*)    Abs Immature Granulocytes 0.10 (*)    All other components within normal limits  BASIC METABOLIC PANEL - Abnormal; Notable for the following components:   Sodium 133 (*)    Potassium 3.2 (*)    Chloride 95 (*)    Glucose, Bld 106 (*)    All other components within normal limits    EKG None  Date: 07/11/2021  Rate: 120  Rhythm: sinus tachycardia  QRS Axis: normal  Intervals: normal  ST/T Wave abnormalities: normal  Conduction Disutrbances: none  Narrative Interpretation:   Old EKG Reviewed: No significant changes noted    Radiology CT Hip Left Wo Contrast  Result Date: 07/11/2021 CLINICAL DATA:  Hip dislocation. Lytic left acetabular lesion on previous MRI. EXAM: CT OF THE LEFT HIP WITHOUT CONTRAST TECHNIQUE: Multidetector CT imaging of the left hip was performed according to the standard protocol. Multiplanar CT image reconstructions were also generated. RADIATION DOSE REDUCTION: This exam was performed according to the departmental dose-optimization program which includes automated exposure control, adjustment of the mA and/or kV according to patient size and/or use of iterative reconstruction technique. COMPARISON:  Left hip MRI 06/03/2021. Radiographs 07/11/2021. PET-CT 05/01/2021. FINDINGS: Bones/Joint/Cartilage Per previous radiology reports, there is a history of metastatic endometrial carcinoma. The large lytic lesion involving the posterosuperior left acetabulum on MRI has enlarged, currently measuring 9.4 x 5.8 x 8.3 cm (previously 5.9 x 5.8 x 7.3 cm). There is progressive destruction of the roof  and posterior wall of the left acetabulum with superomedial dislocation of the left femoral head. There is a mildly displaced fracture through the left superior pubic ramus. There is a lytic metastasis within the right superior pubic ramus. No diastasis of the symphysis pubis or sacroiliac joints demonstrated. The proximal left femur appears intact. Ligaments Suboptimally assessed by CT. Muscles and Tendons No acute muscular abnormalities are identified. The large soft tissue mass associated with the destructive lesion of the left acetabulum likely involves the left obturator internus and piriformis muscles, as before. Soft tissues No focal periarticular fluid collections. Perirectal edema and mass effect on the rectum by the left acetabular mass are  similar to the previous study. IMPRESSION: 1. Enlarging destructive metastasis involving the roof and posterior wall of the left acetabulum compared with previous MRI of 5 weeks ago. Resulting superomedial dislocation of the left femoral head. 2. Smaller metastasis in the right superior pubic ramus. Electronically Signed   By: Richardean Sale M.D.   On: 07/11/2021 15:21   DG Hip Unilat W or Wo Pelvis 2-3 Views Left  Result Date: 07/11/2021 CLINICAL DATA:  Trauma, pain EXAM: DG HIP (WITH OR WITHOUT PELVIS) 2-3V LEFT COMPARISON:  None Available. FINDINGS: There is posterior and medial displacement of left femoral head in relation to the left acetabulum. Cortical margins in the left acetabulum are not distinctly seen. There is possible lucency at the junction of left acetabulum and ischium. Possibility of pathological fracture dislocation should be considered. There is lytic lesion in the right superior pubic ramus. IMPRESSION: There is dislocation in the left hip, possibly posterior dislocation. Cortical margins in the left acetabulum are indistinct. Possibility of pathological fracture with dislocation should be considered. Follow-up CT may be considered for further  evaluation. There is lytic lesion in the right superior pubic ramus suggesting skeletal metastatic disease. Electronically Signed   By: Elmer Picker M.D.   On: 07/11/2021 14:04   DG Knee Complete 4 Views Left  Result Date: 07/11/2021 CLINICAL DATA:  Twisted left knee, pain EXAM: LEFT KNEE - COMPLETE 4+ VIEW COMPARISON:  Knee radiographs 03/29/2014 FINDINGS: There is no acute fracture or dislocation. Knee alignment is normal. There is minimal degenerative change about the medial compartment. The soft tissues are unremarkable. There is no effusion. IMPRESSION: No acute finding. Electronically Signed   By: Valetta Mole M.D.   On: 07/11/2021 12:26    Procedures .Critical Care  Performed by: Domenic Moras, PA-C Authorized by: Domenic Moras, PA-C   Critical care provider statement:    Critical care time (minutes):  30   Critical care was time spent personally by me on the following activities:  Development of treatment plan with patient or surrogate, discussions with consultants, evaluation of patient's response to treatment, examination of patient, ordering and review of laboratory studies, ordering and review of radiographic studies, ordering and performing treatments and interventions, pulse oximetry, re-evaluation of patient's condition and review of old charts     Medications Ordered in ED Medications  propofol (DIPRIVAN) 10 mg/mL bolus/IV push 32.7 mg (has no administration in time range)  oxyCODONE-acetaminophen (PERCOCET/ROXICET) 5-325 MG per tablet 2 tablet (2 tablets Oral Given 07/11/21 1224)  HYDROmorphone (DILAUDID) injection 0.5 mg (0.5 mg Intramuscular Given 07/11/21 1304)  HYDROmorphone (DILAUDID) injection 1 mg (1 mg Intravenous Given 07/11/21 1433)  HYDROmorphone (DILAUDID) injection 1 mg (1 mg Intravenous Given 07/11/21 1551)    ED Course/ Medical Decision Making/ A&P                           Medical Decision Making Amount and/or Complexity of Data Reviewed Labs:  ordered. Radiology: ordered.  Risk Prescription drug management.   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  She reports yesterday she was walking when she twisted her left knee follows with intense pain about the knee radiates to throughout her entire leg worse with movement.  She is using a walker to move about.  She denies any back pain denies hitting her head or loss of consciousness.  She tries taking her home Percocet without adequate relief.  On exam, she has diffuse tenderness about her left knee on palpation with decreased range of motion secondary to pain but no obvious deformity noted.  No signs of infections.  Given history of cancer, will get x-ray of the left knee to rule out pathologic fracture.  She also endorsed left hip pain and therefore x-ray ordered as well as her risk of pathological fracture is high.  2:24 PM X-ray of the left hip and left knee was obtained independently visualized and interpreted me and agree with radiologist capitation.  Initial x-ray of the left knee did not show any acute finding however x-ray of the left hip demonstrate a dislocation to the left hip, possibly posterior dislocation.  Pathological fracture with dislocation is a possibility.  I did reach out to reading radiologist who felt patient would benefit from a CT scan for further assessment as this is not a typical dislocation due to suspect significant erosion of the cortical margins of the acetabulum.  We will consult orthopedics for further management.  Care discussed with Dr. Alvino Chapel  3:10 PM I have consulted with orthopedic team who request for hip to be reduced.  We will perform left  hip reduction under procedural sedation.  4:08 PM After orthopedic team have reviewed CT scan of the left hip and acknowledge that reduction of the left hip was not possible due to significant erosion of the acetabulum, Dr. Doreatha Martin team request for patient to be transferred to [facility] for more definitive management which is likely includes hip replacement.  I have reached out to the PALS line to initiate this process.  4:42 PM I have consulted oncall orthopedist Dr. Starlyn Skeans from [facility] who agrees to accept pt.  Pt to be transfer ER to ER.  This patient presents to the ED for concern of L leg injury, this involves an extensive number of treatment options, and is a complaint that carries with it a high risk of complications and morbidity.  The differential diagnosis includes L hip dislocation, L hip fx, L hip strain, L knee fx, L knee dislocation, lower back pain  Co morbidities that complicate the patient evaluation hx of metastatic cancer Additional history obtained:  Additional history obtained from family members External records from outside source obtained and reviewed including notes from oncology  Lab Tests:  I Ordered, and personally interpreted labs.  The pertinent results include:  as above  Imaging Studies ordered:  I ordered imaging studies including CT L hip I independently visualized and interpreted imaging which showed posterior dislocation I agree with the radiologist interpretation  Cardiac Monitoring:  The patient was maintained on a cardiac monitor.  I personally viewed and interpreted the cardiac monitored which showed an underlying rhythm of: sinus tachycardia  Medicines ordered and prescription drug management:  I ordered medication including dilaudid  for pain Reevaluation of the patient after these medicines showed that the patient improved I have reviewed the patients home medicines and have made adjustments as needed  Test  Considered: as above  Critical Interventions: as above  Consultations Obtained:  I requested consultation with the orthopedist Dr. Lauralyn Primes,  and  discussed lab and imaging findings as well as pertinent plan - they recommend: transfer to [facility] ER to ER for admission  Problem List / ED Course: L hip dislocation  Reevaluation:  After the interventions noted above, I reevaluated the patient and found that they have :improved  Social Determinants of Health: depression  Dispostion:  After consideration of the diagnostic results and the patients response to treatment, I feel that the patent would benefit from transfer to [facility].         Final Clinical Impression(s) / ED Diagnoses Final diagnoses:  Closed posterior dislocation of left hip, initial encounter [facility])    Rx / DC Orders ED Discharge Orders     None         Domenic Moras, PA-C 07/11/21 1742    Davonna Belling, MD 07/12/21 [phone]

## 2021-07-11 NOTE — ED Notes (Signed)
Pt asking for something to drink. Explained to pt that she can not eat or drink until we have ruled out a fracture. Pt given mouth swab with small amount of water to dip it in.

## 2021-07-26 ENCOUNTER — Non-Acute Institutional Stay: Payer: Medicare Other | Admitting: Internal Medicine

## 2021-07-26 VITALS — BP 100/63 | HR 77 | Temp 98.0°F | Resp 18 | Ht 60.0 in | Wt 143.6 lb

## 2021-07-26 DIAGNOSIS — C7800 Secondary malignant neoplasm of unspecified lung: Secondary | ICD-10-CM

## 2021-07-26 DIAGNOSIS — C541 Malignant neoplasm of endometrium: Secondary | ICD-10-CM

## 2021-07-26 DIAGNOSIS — T402X5A Adverse effect of other opioids, initial encounter: Secondary | ICD-10-CM

## 2021-07-26 DIAGNOSIS — Z515 Encounter for palliative care: Secondary | ICD-10-CM

## 2021-07-26 DIAGNOSIS — R5381 Other malaise: Secondary | ICD-10-CM

## 2021-07-26 DIAGNOSIS — C7951 Secondary malignant neoplasm of bone: Secondary | ICD-10-CM

## 2021-07-26 DIAGNOSIS — K5903 Drug induced constipation: Secondary | ICD-10-CM
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
## 2021-08-02 ENCOUNTER — Encounter: Payer: Self-pay | Admitting: Internal Medicine

## 2021-09-15 DEATH — deceased
